# Patient Record
Sex: Male | Born: 1955 | Race: Black or African American | Hispanic: No | Smoking: Former smoker
Health system: Southern US, Community
[De-identification: ages and names within clinical notes are randomized; demographics above are authoritative.]

## PROBLEM LIST (undated history)

## (undated) DIAGNOSIS — I10 Essential (primary) hypertension: Secondary | ICD-10-CM

## (undated) DIAGNOSIS — R079 Chest pain, unspecified: Secondary | ICD-10-CM

## (undated) DIAGNOSIS — Z947 Corneal transplant status: Secondary | ICD-10-CM

## (undated) DIAGNOSIS — I2510 Atherosclerotic heart disease of native coronary artery without angina pectoris: Secondary | ICD-10-CM

## (undated) DIAGNOSIS — H548 Legal blindness, as defined in USA: Secondary | ICD-10-CM

## (undated) DIAGNOSIS — E119 Type 2 diabetes mellitus without complications: Secondary | ICD-10-CM

## (undated) DIAGNOSIS — E785 Hyperlipidemia, unspecified: Secondary | ICD-10-CM

## (undated) HISTORY — PX: ENUCLEATION: SHX628

## (undated) HISTORY — PX: CORONARY STENT PLACEMENT: SHX1402

## (undated) HISTORY — PX: CORNEAL TRANSPLANT: SHX108

## (undated) HISTORY — PX: CARDIAC SURGERY: SHX584

---

## 1998-06-10 ENCOUNTER — Emergency Department (HOSPITAL_COMMUNITY): Admission: EM | Admit: 1998-06-10 | Discharge: 1998-06-10 | Payer: Self-pay | Admitting: Emergency Medicine

## 1998-07-20 ENCOUNTER — Ambulatory Visit (HOSPITAL_COMMUNITY): Admission: RE | Admit: 1998-07-20 | Discharge: 1998-07-20 | Payer: Self-pay | Admitting: Orthopedic Surgery

## 1998-09-01 ENCOUNTER — Encounter: Admission: RE | Admit: 1998-09-01 | Discharge: 1998-10-02 | Payer: Self-pay | Admitting: Orthopedic Surgery

## 2000-10-08 ENCOUNTER — Emergency Department (HOSPITAL_COMMUNITY): Admission: EM | Admit: 2000-10-08 | Discharge: 2000-10-08 | Payer: Self-pay | Admitting: Emergency Medicine

## 2000-10-08 ENCOUNTER — Encounter: Payer: Self-pay | Admitting: Emergency Medicine

## 2000-11-07 ENCOUNTER — Emergency Department (HOSPITAL_COMMUNITY): Admission: EM | Admit: 2000-11-07 | Discharge: 2000-11-07 | Payer: Self-pay | Admitting: Emergency Medicine

## 2000-11-07 ENCOUNTER — Encounter: Payer: Self-pay | Admitting: Emergency Medicine

## 2000-11-08 ENCOUNTER — Emergency Department (HOSPITAL_COMMUNITY): Admission: EM | Admit: 2000-11-08 | Discharge: 2000-11-08 | Payer: Self-pay | Admitting: Emergency Medicine

## 2000-11-22 ENCOUNTER — Encounter: Admission: RE | Admit: 2000-11-22 | Discharge: 2001-02-20 | Payer: Self-pay | Admitting: Internal Medicine

## 2001-06-30 ENCOUNTER — Emergency Department (HOSPITAL_COMMUNITY): Admission: EM | Admit: 2001-06-30 | Discharge: 2001-06-30 | Payer: Self-pay | Admitting: Emergency Medicine

## 2001-06-30 ENCOUNTER — Encounter: Payer: Self-pay | Admitting: Emergency Medicine

## 2001-07-04 ENCOUNTER — Encounter: Payer: Self-pay | Admitting: Emergency Medicine

## 2001-07-04 ENCOUNTER — Emergency Department (HOSPITAL_COMMUNITY): Admission: EM | Admit: 2001-07-04 | Discharge: 2001-07-04 | Payer: Self-pay | Admitting: Emergency Medicine

## 2001-07-06 ENCOUNTER — Encounter: Payer: Self-pay | Admitting: Gastroenterology

## 2001-07-06 ENCOUNTER — Ambulatory Visit (HOSPITAL_COMMUNITY): Admission: RE | Admit: 2001-07-06 | Discharge: 2001-07-06 | Payer: Self-pay | Admitting: Gastroenterology

## 2001-07-07 ENCOUNTER — Encounter: Payer: Self-pay | Admitting: Gastroenterology

## 2001-07-07 ENCOUNTER — Ambulatory Visit (HOSPITAL_COMMUNITY): Admission: RE | Admit: 2001-07-07 | Discharge: 2001-07-07 | Payer: Self-pay | Admitting: Gastroenterology

## 2001-07-14 ENCOUNTER — Encounter: Payer: Self-pay | Admitting: Urology

## 2001-07-14 ENCOUNTER — Ambulatory Visit (HOSPITAL_COMMUNITY): Admission: RE | Admit: 2001-07-14 | Discharge: 2001-07-14 | Payer: Self-pay | Admitting: Urology

## 2001-07-28 ENCOUNTER — Encounter: Payer: Self-pay | Admitting: Emergency Medicine

## 2001-07-28 ENCOUNTER — Emergency Department (HOSPITAL_COMMUNITY): Admission: EM | Admit: 2001-07-28 | Discharge: 2001-07-28 | Payer: Self-pay | Admitting: Emergency Medicine

## 2001-08-04 ENCOUNTER — Ambulatory Visit (HOSPITAL_COMMUNITY): Admission: RE | Admit: 2001-08-04 | Discharge: 2001-08-04 | Payer: Self-pay | Admitting: Gastroenterology

## 2002-08-17 ENCOUNTER — Inpatient Hospital Stay (HOSPITAL_COMMUNITY): Admission: EM | Admit: 2002-08-17 | Discharge: 2002-08-18 | Payer: Self-pay | Admitting: Emergency Medicine

## 2002-08-17 ENCOUNTER — Encounter: Payer: Self-pay | Admitting: Emergency Medicine

## 2002-10-17 ENCOUNTER — Encounter: Payer: Self-pay | Admitting: Internal Medicine

## 2002-10-17 ENCOUNTER — Encounter: Admission: RE | Admit: 2002-10-17 | Discharge: 2002-10-17 | Payer: Self-pay | Admitting: Internal Medicine

## 2003-04-29 ENCOUNTER — Inpatient Hospital Stay (HOSPITAL_COMMUNITY): Admission: AD | Admit: 2003-04-29 | Discharge: 2003-05-01 | Payer: Self-pay | Admitting: Neurology

## 2003-04-29 ENCOUNTER — Emergency Department (HOSPITAL_COMMUNITY): Admission: EM | Admit: 2003-04-29 | Discharge: 2003-04-29 | Payer: Self-pay | Admitting: Emergency Medicine

## 2003-04-29 ENCOUNTER — Encounter: Payer: Self-pay | Admitting: Neurology

## 2003-04-29 ENCOUNTER — Encounter: Payer: Self-pay | Admitting: Emergency Medicine

## 2003-04-30 ENCOUNTER — Encounter: Payer: Self-pay | Admitting: Neurology

## 2003-04-30 ENCOUNTER — Encounter (INDEPENDENT_AMBULATORY_CARE_PROVIDER_SITE_OTHER): Payer: Self-pay | Admitting: Cardiology

## 2003-07-23 ENCOUNTER — Emergency Department (HOSPITAL_COMMUNITY): Admission: EM | Admit: 2003-07-23 | Discharge: 2003-07-23 | Payer: Self-pay | Admitting: *Deleted

## 2003-07-23 ENCOUNTER — Encounter: Payer: Self-pay | Admitting: Emergency Medicine

## 2003-11-14 ENCOUNTER — Emergency Department (HOSPITAL_COMMUNITY): Admission: EM | Admit: 2003-11-14 | Discharge: 2003-11-14 | Payer: Self-pay | Admitting: Emergency Medicine

## 2004-01-02 ENCOUNTER — Inpatient Hospital Stay (HOSPITAL_COMMUNITY): Admission: EM | Admit: 2004-01-02 | Discharge: 2004-01-03 | Payer: Self-pay | Admitting: Emergency Medicine

## 2004-01-26 ENCOUNTER — Emergency Department (HOSPITAL_COMMUNITY): Admission: EM | Admit: 2004-01-26 | Discharge: 2004-01-26 | Payer: Self-pay | Admitting: Emergency Medicine

## 2004-03-10 ENCOUNTER — Emergency Department (HOSPITAL_COMMUNITY): Admission: EM | Admit: 2004-03-10 | Discharge: 2004-03-10 | Payer: Self-pay

## 2004-07-30 ENCOUNTER — Ambulatory Visit: Payer: Self-pay | Admitting: *Deleted

## 2004-07-31 ENCOUNTER — Encounter: Payer: Self-pay | Admitting: Cardiovascular Disease

## 2004-07-31 ENCOUNTER — Inpatient Hospital Stay (HOSPITAL_COMMUNITY): Admission: EM | Admit: 2004-07-31 | Discharge: 2004-08-02 | Payer: Self-pay | Admitting: Emergency Medicine

## 2005-06-29 ENCOUNTER — Emergency Department (HOSPITAL_COMMUNITY): Admission: EM | Admit: 2005-06-29 | Discharge: 2005-06-29 | Payer: Self-pay | Admitting: Emergency Medicine

## 2005-07-06 ENCOUNTER — Emergency Department (HOSPITAL_COMMUNITY): Admission: EM | Admit: 2005-07-06 | Discharge: 2005-07-06 | Payer: Self-pay | Admitting: Emergency Medicine

## 2005-11-15 ENCOUNTER — Ambulatory Visit (HOSPITAL_COMMUNITY): Admission: RE | Admit: 2005-11-15 | Discharge: 2005-11-15 | Payer: Self-pay | Admitting: Orthopedic Surgery

## 2006-01-11 ENCOUNTER — Encounter (INDEPENDENT_AMBULATORY_CARE_PROVIDER_SITE_OTHER): Payer: Self-pay | Admitting: *Deleted

## 2006-01-11 ENCOUNTER — Ambulatory Visit (HOSPITAL_BASED_OUTPATIENT_CLINIC_OR_DEPARTMENT_OTHER): Admission: RE | Admit: 2006-01-11 | Discharge: 2006-01-11 | Payer: Self-pay | Admitting: Orthopedic Surgery

## 2006-10-07 ENCOUNTER — Ambulatory Visit: Payer: Self-pay | Admitting: Cardiology

## 2006-10-07 ENCOUNTER — Inpatient Hospital Stay (HOSPITAL_COMMUNITY): Admission: EM | Admit: 2006-10-07 | Discharge: 2006-10-11 | Payer: Self-pay | Admitting: Pediatrics

## 2006-10-08 ENCOUNTER — Encounter: Payer: Self-pay | Admitting: Cardiovascular Disease

## 2006-10-11 HISTORY — PX: CORONARY ANGIOPLASTY: SHX604

## 2007-09-13 ENCOUNTER — Ambulatory Visit (HOSPITAL_BASED_OUTPATIENT_CLINIC_OR_DEPARTMENT_OTHER): Admission: RE | Admit: 2007-09-13 | Discharge: 2007-09-13 | Payer: Self-pay | Admitting: Urology

## 2008-08-19 ENCOUNTER — Encounter: Admission: RE | Admit: 2008-08-19 | Discharge: 2008-08-19 | Payer: Self-pay | Admitting: Internal Medicine

## 2008-11-02 ENCOUNTER — Emergency Department (HOSPITAL_COMMUNITY): Admission: EM | Admit: 2008-11-02 | Discharge: 2008-11-02 | Payer: Self-pay | Admitting: Emergency Medicine

## 2008-12-26 ENCOUNTER — Encounter (INDEPENDENT_AMBULATORY_CARE_PROVIDER_SITE_OTHER): Payer: Self-pay | Admitting: Emergency Medicine

## 2008-12-26 ENCOUNTER — Ambulatory Visit: Payer: Self-pay | Admitting: Vascular Surgery

## 2008-12-26 ENCOUNTER — Emergency Department (HOSPITAL_COMMUNITY): Admission: EM | Admit: 2008-12-26 | Discharge: 2008-12-26 | Payer: Self-pay | Admitting: Emergency Medicine

## 2009-01-11 ENCOUNTER — Encounter: Admission: RE | Admit: 2009-01-11 | Discharge: 2009-01-11 | Payer: Self-pay | Admitting: Internal Medicine

## 2009-01-15 HISTORY — PX: OTHER SURGICAL HISTORY: SHX169

## 2009-03-04 ENCOUNTER — Emergency Department (HOSPITAL_COMMUNITY): Admission: EM | Admit: 2009-03-04 | Discharge: 2009-03-04 | Payer: Self-pay | Admitting: Emergency Medicine

## 2009-07-28 ENCOUNTER — Encounter (INDEPENDENT_AMBULATORY_CARE_PROVIDER_SITE_OTHER): Payer: Self-pay | Admitting: Internal Medicine

## 2009-07-28 ENCOUNTER — Ambulatory Visit: Payer: Self-pay | Admitting: Cardiology

## 2009-07-28 ENCOUNTER — Inpatient Hospital Stay (HOSPITAL_COMMUNITY): Admission: EM | Admit: 2009-07-28 | Discharge: 2009-07-30 | Payer: Self-pay | Admitting: Emergency Medicine

## 2009-07-28 HISTORY — PX: TRANSTHORACIC ECHOCARDIOGRAM: SHX275

## 2009-09-12 ENCOUNTER — Emergency Department (HOSPITAL_COMMUNITY): Admission: EM | Admit: 2009-09-12 | Discharge: 2009-09-12 | Payer: Self-pay | Admitting: Emergency Medicine

## 2010-10-28 ENCOUNTER — Encounter: Admission: RE | Admit: 2010-10-28 | Payer: Self-pay | Source: Home / Self Care | Admitting: Ophthalmology

## 2010-11-11 IMAGING — CR DG CHEST 2V
2 series · 2 of 2 positions shown · non-contrast
Comparison: Chest radiograph performed 12/26/2008

CLINICAL DATA: Chest pain and headache.

CHEST - 2 VIEW

[w chest pa]
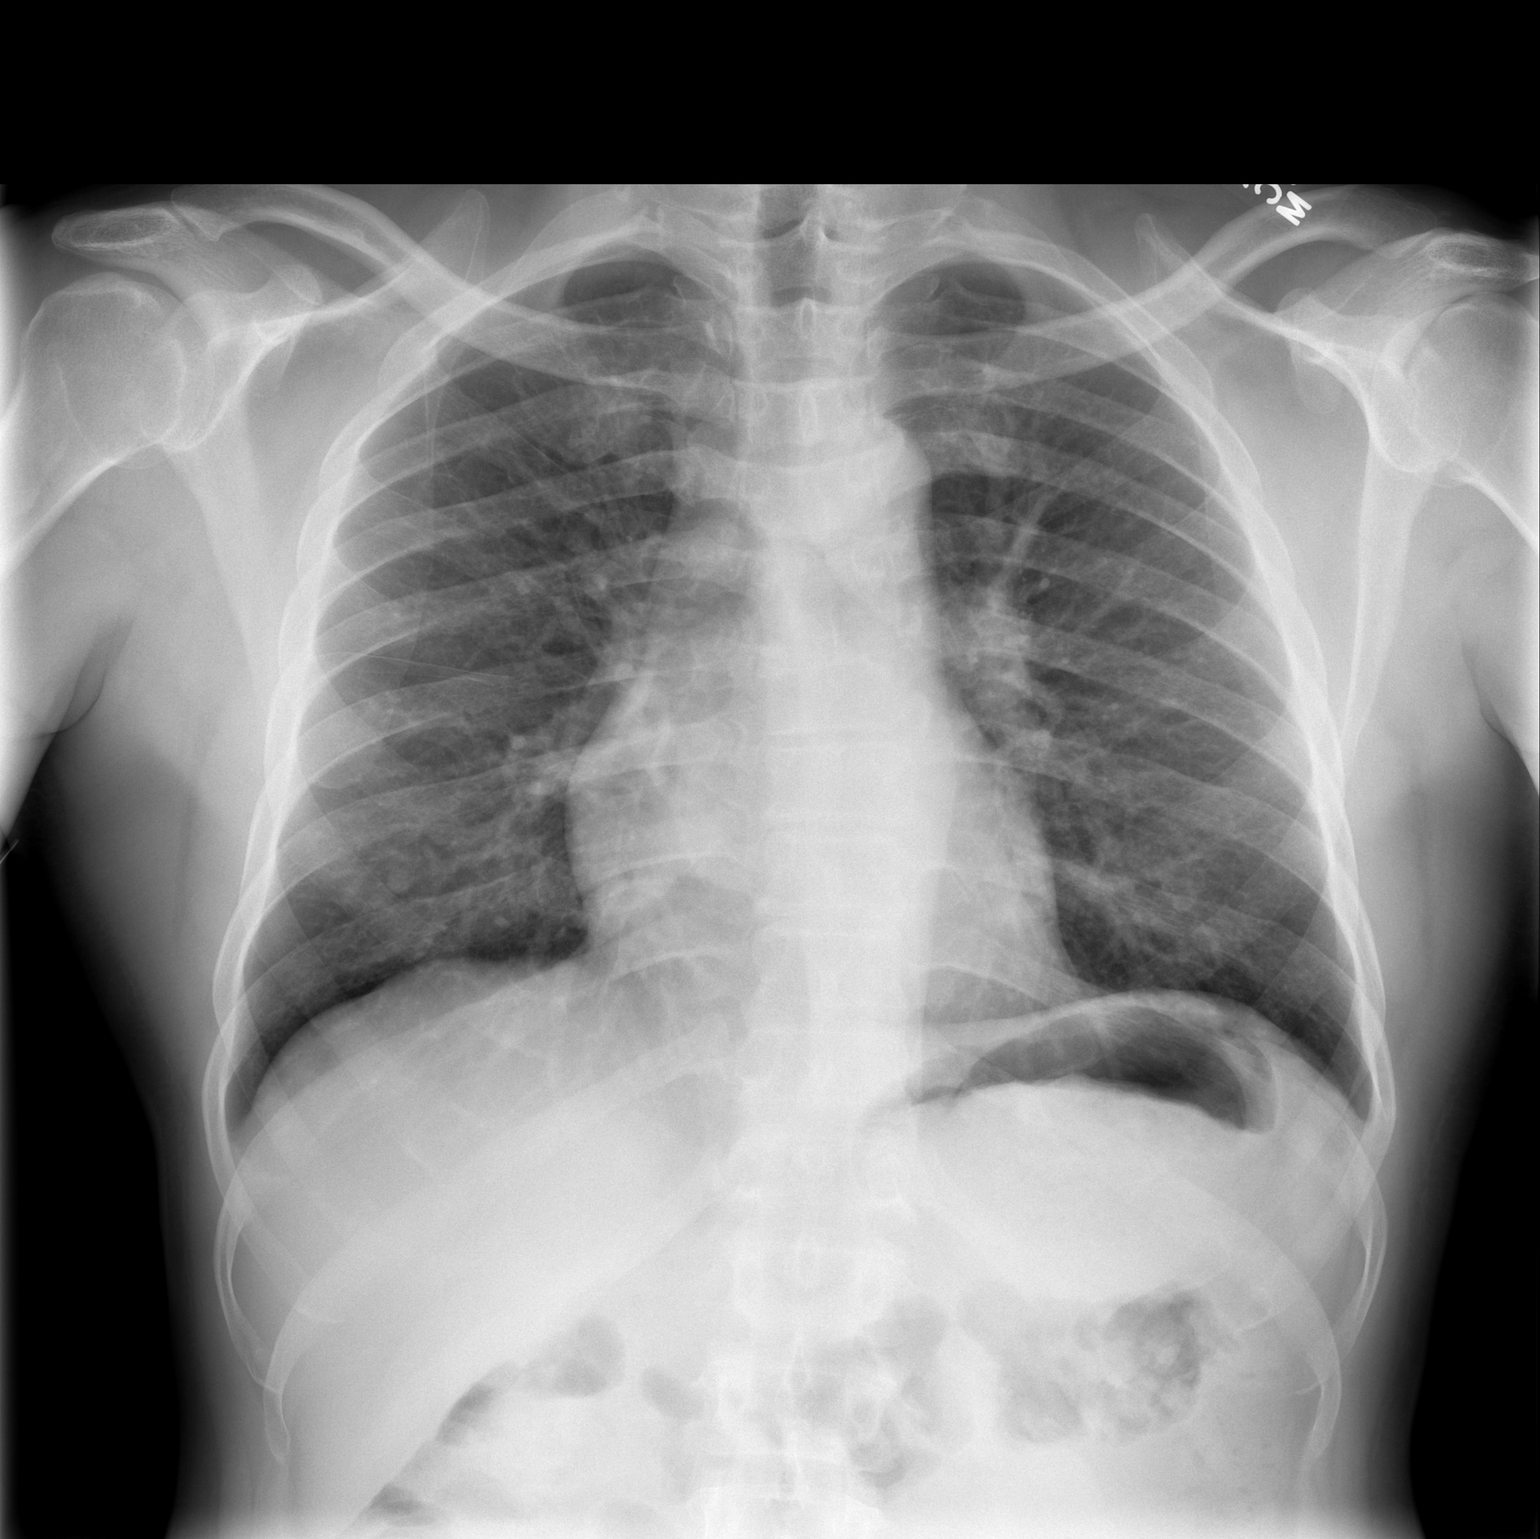

[w chest lat]
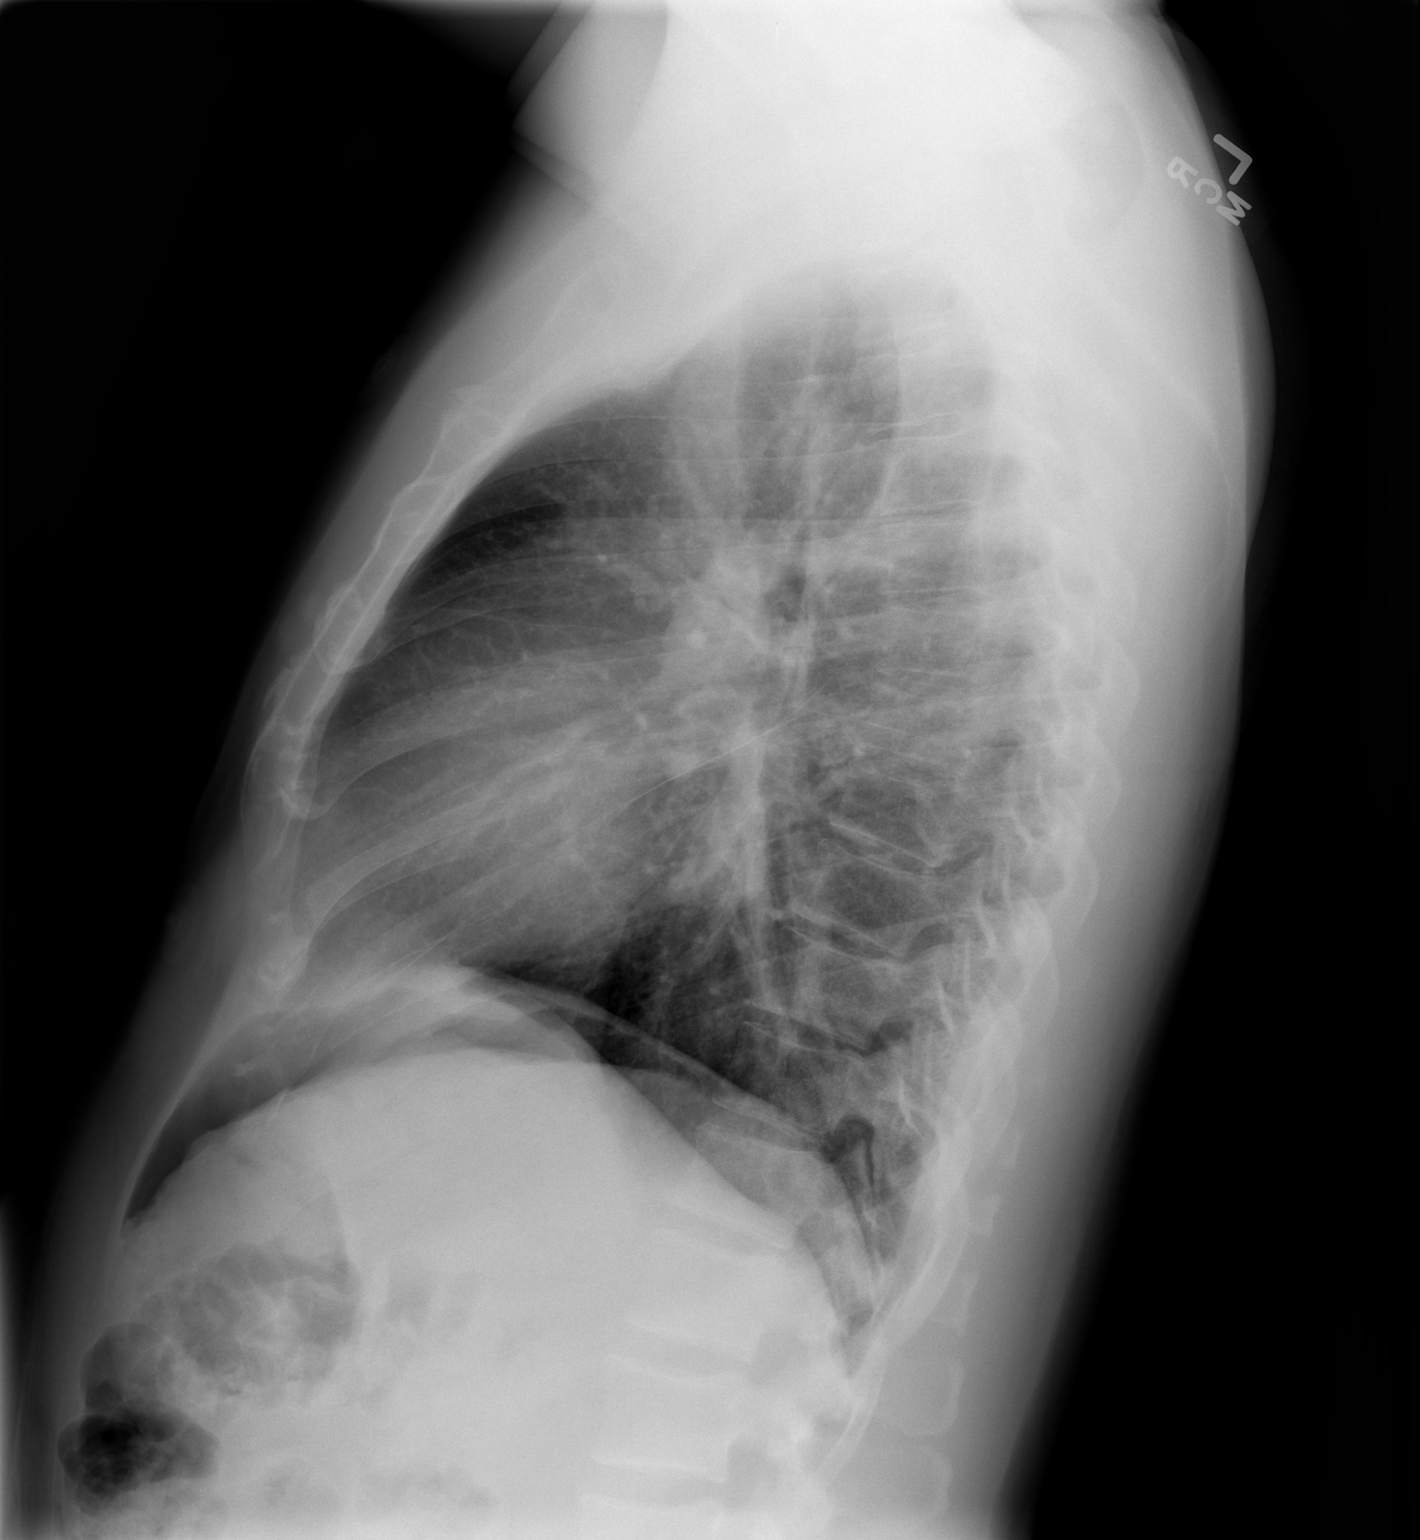

[2 of 2 positions shown; findings below may reference images not displayed]

FINDINGS: The lungs are well-aerated and clear.  There is no
evidence of focal opacification, pleural effusion or pneumothorax.

The heart is normal in size; the mediastinal contour is within
normal limits.  There is slight prominence of the pulmonary
vasculature, in the upper limits of normal.  No acute osseous
abnormalities are seen.
IMPRESSION: No acute cardiopulmonary process seen.

## 2010-11-25 ENCOUNTER — Encounter: Admit: 2010-11-25 | Payer: Self-pay | Admitting: Ophthalmology

## 2010-11-26 ENCOUNTER — Ambulatory Visit: Payer: Medicare Other | Attending: Ophthalmology | Admitting: Occupational Therapy

## 2010-11-26 DIAGNOSIS — IMO0001 Reserved for inherently not codable concepts without codable children: Secondary | ICD-10-CM | POA: Insufficient documentation

## 2010-11-26 DIAGNOSIS — H5410 Blindness, one eye, low vision other eye, unspecified eyes: Secondary | ICD-10-CM | POA: Insufficient documentation

## 2011-01-12 LAB — POCT I-STAT, CHEM 8
BUN: 9 mg/dL (ref 6–23)
Calcium, Ion: 1.2 mmol/L (ref 1.12–1.32)
Chloride: 99 meq/L (ref 96–112)
Creatinine, Ser: 0.8 mg/dL (ref 0.4–1.5)
Glucose, Bld: 105 mg/dL — ABNORMAL HIGH (ref 70–99)
HCT: 49 % (ref 39.0–52.0)
Hemoglobin: 16.7 g/dL (ref 13.0–17.0)
Potassium: 4.8 meq/L (ref 3.5–5.1)
Sodium: 137 meq/L (ref 135–145)
TCO2: 30 mmol/L (ref 0–100)

## 2011-01-12 LAB — URINALYSIS, ROUTINE W REFLEX MICROSCOPIC
Bilirubin Urine: NEGATIVE
Glucose, UA: NEGATIVE mg/dL
Hgb urine dipstick: NEGATIVE
Ketones, ur: NEGATIVE mg/dL
Nitrite: NEGATIVE
Protein, ur: NEGATIVE mg/dL
Specific Gravity, Urine: 1.011 (ref 1.005–1.030)
Urobilinogen, UA: 1 mg/dL (ref 0.0–1.0)
pH: 6.5 (ref 5.0–8.0)

## 2011-01-12 LAB — GLUCOSE, CAPILLARY: Glucose-Capillary: 94 mg/dL (ref 70–99)

## 2011-01-14 LAB — CBC
HCT: 38.2 % — ABNORMAL LOW (ref 39.0–52.0)
HCT: 40.8 % (ref 39.0–52.0)
HCT: 41.3 % (ref 39.0–52.0)
Hemoglobin: 14 g/dL (ref 13.0–17.0)
Hemoglobin: 14.2 g/dL (ref 13.0–17.0)
MCHC: 34.2 g/dL (ref 30.0–36.0)
MCHC: 34.2 g/dL (ref 30.0–36.0)
MCV: 99.5 fL (ref 78.0–100.0)
MCV: 99.5 fL (ref 78.0–100.0)
Platelets: 182 10*3/uL (ref 150–400)
Platelets: 184 10*3/uL (ref 150–400)
Platelets: 206 10*3/uL (ref 150–400)
RBC: 4.16 MIL/uL — ABNORMAL LOW (ref 4.22–5.81)
RDW: 13.8 % (ref 11.5–15.5)
RDW: 14 % (ref 11.5–15.5)
RDW: 14.1 % (ref 11.5–15.5)

## 2011-01-14 LAB — LIPID PANEL
LDL Cholesterol: 59 mg/dL (ref 0–99)
VLDL: 16 mg/dL (ref 0–40)

## 2011-01-14 LAB — BASIC METABOLIC PANEL
BUN: 10 mg/dL (ref 6–23)
BUN: 10 mg/dL (ref 6–23)
BUN: 11 mg/dL (ref 6–23)
CO2: 27 mEq/L (ref 19–32)
CO2: 28 mEq/L (ref 19–32)
CO2: 29 mEq/L (ref 19–32)
Calcium: 9.1 mg/dL (ref 8.4–10.5)
Calcium: 9.3 mg/dL (ref 8.4–10.5)
Calcium: 9.3 mg/dL (ref 8.4–10.5)
Chloride: 105 mEq/L (ref 96–112)
Creatinine, Ser: 0.99 mg/dL (ref 0.4–1.5)
Creatinine, Ser: 1.2 mg/dL (ref 0.4–1.5)
Creatinine, Ser: 1.22 mg/dL (ref 0.4–1.5)
GFR calc Af Amer: >60 mL/min (ref >60)
GFR calc non Af Amer: >60 mL/min (ref >60)
GFR calc non Af Amer: >60 mL/min (ref >60)
GFR calc non Af Amer: >60 mL/min (ref >60)
Glucose, Bld: 104 mg/dL — ABNORMAL HIGH (ref 70–99)
Glucose, Bld: 86 mg/dL (ref 70–99)
Glucose, Bld: 89 mg/dL (ref 70–99)
Potassium: 4 mEq/L (ref 3.5–5.1)
Potassium: 4.1 mEq/L (ref 3.5–5.1)
Sodium: 137 mEq/L (ref 135–145)
Sodium: 139 mEq/L (ref 135–145)

## 2011-01-14 LAB — URINE MICROSCOPIC-ADD ON

## 2011-01-14 LAB — DIFFERENTIAL
Basophils Absolute: 0 10*3/uL (ref 0.0–0.1)
Basophils Relative: 0 % (ref 0–1)
Eosinophils Relative: 0 % (ref 0–5)
Lymphocytes Relative: 33 % (ref 12–46)
Monocytes Absolute: 0.7 10*3/uL (ref 0.1–1.0)

## 2011-01-14 LAB — GLUCOSE, CAPILLARY
Glucose-Capillary: 106 mg/dL — ABNORMAL HIGH (ref 70–99)
Glucose-Capillary: 135 mg/dL — ABNORMAL HIGH (ref 70–99)
Glucose-Capillary: 77 mg/dL (ref 70–99)
Glucose-Capillary: 78 mg/dL (ref 70–99)
Glucose-Capillary: 78 mg/dL (ref 70–99)
Glucose-Capillary: 82 mg/dL (ref 70–99)

## 2011-01-14 LAB — URINALYSIS, ROUTINE W REFLEX MICROSCOPIC
Bilirubin Urine: NEGATIVE
Nitrite: NEGATIVE

## 2011-01-14 LAB — HOMOCYSTEINE: Homocysteine: 14 umol/L (ref 4.0–15.4)

## 2011-01-14 LAB — TSH: TSH: 1.217 u[IU]/mL (ref 0.350–4.500)

## 2011-01-14 LAB — CARDIAC PANEL(CRET KIN+CKTOT+MB+TROPI)
Relative Index: 0.4 (ref 0.0–2.5)
Relative Index: 0.5 (ref 0.0–2.5)
Troponin I: <0.01 ng/mL (ref 0.00–0.06)

## 2011-01-14 LAB — D-DIMER, QUANTITATIVE: D-Dimer, Quant: 0.26 ug/mL-FEU (ref 0.00–0.48)

## 2011-01-14 LAB — PROTIME-INR: INR: 1.06 (ref 0.00–1.49)

## 2011-01-14 LAB — HEMOGLOBIN A1C: Mean Plasma Glucose: 143 mg/dL

## 2011-01-14 LAB — POCT CARDIAC MARKERS: Troponin i, poc: <0.05 ng/mL (ref 0.00–0.09)

## 2011-01-14 LAB — CK TOTAL AND CKMB (NOT AT ARMC): Relative Index: 0.4 (ref 0.0–2.5)

## 2011-01-19 LAB — DIFFERENTIAL
Basophils Absolute: 0 10*3/uL (ref 0.0–0.1)
Basophils Relative: 0 % (ref 0–1)
Eosinophils Absolute: 0 10*3/uL (ref 0.0–0.7)
Eosinophils Relative: 0 % (ref 0–5)
Lymphocytes Relative: 23 % (ref 12–46)
Monocytes Absolute: 0.4 10*3/uL (ref 0.1–1.0)

## 2011-01-19 LAB — POCT I-STAT, CHEM 8
BUN: 14 mg/dL (ref 6–23)
Calcium, Ion: 1.27 mmol/L (ref 1.12–1.32)
HCT: 46 % (ref 39.0–52.0)
Hemoglobin: 15.6 g/dL (ref 13.0–17.0)
Sodium: 138 mEq/L (ref 135–145)
TCO2: 27 mmol/L (ref 0–100)

## 2011-01-19 LAB — URINALYSIS, ROUTINE W REFLEX MICROSCOPIC
Glucose, UA: NEGATIVE mg/dL
Ketones, ur: NEGATIVE mg/dL
Protein, ur: NEGATIVE mg/dL
Urobilinogen, UA: 1 mg/dL (ref 0.0–1.0)

## 2011-01-19 LAB — CBC
HCT: 43.3 % (ref 39.0–52.0)
Hemoglobin: 14.8 g/dL (ref 13.0–17.0)
MCHC: 34.2 g/dL (ref 30.0–36.0)
MCV: 96.2 fL (ref 78.0–100.0)
Platelets: 176 10*3/uL (ref 150–400)
RDW: 14.8 % (ref 11.5–15.5)

## 2011-01-19 LAB — HEMOCCULT GUIAC POC 1CARD (OFFICE): Fecal Occult Bld: NEGATIVE

## 2011-01-21 LAB — DIFFERENTIAL
Basophils Relative: 0 % (ref 0–1)
Eosinophils Absolute: 0 10*3/uL (ref 0.0–0.7)
Neutro Abs: 4.5 10*3/uL (ref 1.7–7.7)
Neutrophils Relative %: 80 % — ABNORMAL HIGH (ref 43–77)

## 2011-01-21 LAB — HEPATIC FUNCTION PANEL
AST: 41 U/L — ABNORMAL HIGH (ref 0–37)
Albumin: 4.8 g/dL (ref 3.5–5.2)
Total Protein: 8.5 g/dL — ABNORMAL HIGH (ref 6.0–8.3)

## 2011-01-21 LAB — BASIC METABOLIC PANEL
BUN: 10 mg/dL (ref 6–23)
CO2: 26 mEq/L (ref 19–32)
Calcium: 10 mg/dL (ref 8.4–10.5)
Chloride: 102 mEq/L (ref 96–112)
Creatinine, Ser: 1.1 mg/dL (ref 0.4–1.5)
GFR calc Af Amer: >60 mL/min (ref >60)

## 2011-01-21 LAB — POCT CARDIAC MARKERS
CKMB, poc: <1 ng/mL — ABNORMAL LOW (ref 1.0–8.0)
Myoglobin, poc: 70 ng/mL (ref 12–200)
Troponin i, poc: <0.05 ng/mL (ref 0.00–0.09)

## 2011-01-21 LAB — CBC
MCHC: 34.5 g/dL (ref 30.0–36.0)
MCV: 95.2 fL (ref 78.0–100.0)
Platelets: 205 10*3/uL (ref 150–400)
WBC: 5.5 10*3/uL (ref 4.0–10.5)

## 2011-02-23 NOTE — Op Note (Signed)
NAME:  Steven, [name]              ACCOUNT NO.:  [id number]   MEDICAL RECORD NO.:  [id number]          PATIENT TYPE:  AMB   LOCATION:  [facility]                         FACILITY:  [facility]   PHYSICIAN:  Lindaann Slough, M.D.  DATE OF BIRTH:  May 13, 1956   DATE OF PROCEDURE:  09/13/2007  DATE OF DISCHARGE:                               OPERATIVE REPORT   PREOPERATIVE DIAGNOSIS:  Left ureteral calculus with hydronephrosis.   POSTOPERATIVE DIAGNOSES:  Left ureteral calculus with hydronephrosis  plus urethral stricture.   PROCEDURE DONE:  Cystoscopy, urethral dilation, left retrograde  pyelogram, ureteroscopy with holmium laser of left ureteral calculus and  insertion of double-AJ catheter.   SURGEON:  Danae Chen, M.D.   ANESTHESIA:  General.   DATE OF PROCEDURE:  September 13, 2007.   INDICATIONS:  The patient is a 55 year old male who was seen in the  emergency room in [place] on July 24, 2007 for severe left  flank pain associated with hematuria.  CT scan showed a 7 mm stone just  distal to the junction of the 2 proximal left ureters in  a duplicated  left system.  It is associated with a moderate left hydronephrosis and  hydroureter.  The patient was seen in the office on August 29, 2007  and he had continued to complain of pain.  He was treated with Vicodin.  A week later he returned to the office for follow-up and had not passed  the stone.  He had been on Plavix and aspirin.  They were discontinued  and he is scheduled today for stone manipulation.   Under general anesthesia the patient was prepped and draped and placed  in the dorsal lithotomy position.  A panendoscope was inserted in the  urethra but could not be passed into the bladder because of a stricture  in the bulbous urethra.  A a sensor-tip guidewire was passed through the  cystoscope into the bladder.  The cystoscope was removed.  The urethra  was then dilated up to #24-French with [facility] dilators.  Then  the  guidewire was removed and the cystoscope was then inserted in the  bladder.  There was moderate prostatic hypertrophy.  The bladder is  moderately trabeculated.  There is no stone or tumor in the bladder.  There is some edema at the left ureteral orifice.   Retrograde pyelogram:  A cone-tip catheter was passed through the  cystoscope into the left ureteral orifice.  Contrast was then injected  through the cone-tip catheter.  There is a filling defect in the distal  ureter with proximal hydroureter and hydronephrosis.  Both ureters above  the junction of the ureters were dilated.  The cone-tip catheter was  then removed.  A sensor-tip guidewire was passed through the cystoscope  but could not be passed through the ureteral orifice.  The sensor-tip  wire was then removed.  A Glidewire was passed through a #6-French open-  ended ureteral catheter and the open-ended catheter and Glidewire were  passed through the cystoscope into the left ureteral orifice.  The  Glidewire was then advanced up to the  mid ureter but could not be passed  beyond that point and appears to be at the confluence of the ureters.  After several attempts I then decided not to pass the Glidewire all the  way up into the kidney.  I removed the cystoscope and I dilated the  intramural ureter away from the inner sheath of the ureteroscope access  sheath.  The ureteroscope access sheath was then removed.  A #6.5 French  semirigid ureteroscope was then passed in the bladder and through the  ureter and advanced all the way up to the level of the stone.  The stone  was then visualized in the ureter.  A 360 microfiber holmium laser was  passed down through the ureteroscope and with a setting of 0.8 joules  the stone was fragmented in multiple stone fragments.  Then a Glidewire  was passed through the ureteroscope and again the Glidewire could not be  advanced in the upper ureter in that there appears to be some  obstruction  at the confluence of the 2 ureters.  However, after several  maneuvers the Glidewire was passed through the ureter and advanced all  the way up into the renal pelvis.  The ureteroscope was then removed.  An open-ended catheter was passed over the Glidewire all the way up into  the renal pelvis.  The Glidewire was then removed.   Second retrograde pyelogram:  Contrast was then injected through the  open-ended catheter and there is no evidence of extravasation of  contrast and the renal pelvis and calyces are dilated.  A sensor-tip  guidewire was then passed through the open-ended catheter and the open-  ended catheter was removed.  Then the ureteroscope was reinserted in the  bladder but I could not pass it again through the ureteral orifice.  The  ureteroscope was removed.  The ureteroscope access sheath was then  passed over the guidewire and the intramural ureter was dilated and the  ureteroscope access sheath was removed.  I again attempted to pass the  ureteroscope through the ureteral orifice but it was difficult because  of edema at the ureteral orifice.  I removed the ureteroscope.  The  guidewire was then back-loaded into the cystoscope and a #6-French 26  double-AJ catheter was passed over the guidewire.  The proximal curl of  the double-J catheter is in the upper collecting system.  The distal  curl is in the bladder.  The bladder was then emptied and the cystoscope  and guidewire were removed.   The patient tolerated the procedure well and left the OR in satisfactory  condition to postanesthesia care unit.      Lindaann Slough, M.D.  Electronically Signed     MN/MEDQ  D:  09/13/2007  T:  09/14/2007  Job:  [id number]

## 2011-02-26 NOTE — Op Note (Signed)
[facility]  Patient:    Steven [name], Steven [name] Visit Number: [id number] MRN: [id number]          Service Type: END Location: ENDO Attending Physician:  Charna Elizabeth Proc. Date: 07/14/01 Admit Date:  07/07/2001 Discharge Date: 07/07/2001                             Operative Report  PREOPERATIVE DIAGNOSES: 1. Right hydroureter. 2. Left ureteral stone with hydronephrosis.  POSTOPERATIVE DIAGNOSES: 1. Right hydroureter. 2. Left ureteral stone with hydronephrosis. 3. Urethral stricture.  PROCEDURE DONE:  Cystoscopy, visual urethrectomy, right retrograde pyelogram, ureteroscopy with holmium laser ureteral stone extraction, and insertion of left double-J catheter.  SURGEON:  Lindaann Slough, M.D.  ANESTHESIA:  General.  INDICATION:  The patient is a 55 year old male, who has been complaining of bilateral flank pain associated with frequency, hesitancy, decreased force of the urinary stream.  A CT scan of the abdomen showed a 7 x 6 mm stone in the left distal ureter with left hydronephrosis and a right hydroureter down to the UV junction without evidence of ureteral stone.  He also has findings of prostatic hypertrophy.  The patient is scheduled today for cystoscopy, right retrograde pyelogram, and ureteroscopy with stone manipulation.  DESCRIPTION OF PROCEDURE:  Under general anesthesia, the patient was prepped and draped and placed in the dorsal lithotomy position.  A #22 Wappler cystoscope was inserted in the urethra.  But the cystoscope could not be passed in the bladder because of a stricture in the bulbous urethra.  The cystoscope was removed.  A visual urethrotome was then inserted in the urethra and the stricture was incised, and the urethrotome could be advanced in the bladder.  The urethrotome was then removed.  The cystoscope was then passed in the bladder.  There was moderate hypertrophy of the prostate gland.  There was some edema at the  level of the trigone.  The bladder was moderately trabeculated.  There was no stone or tumor in the bladder.  The ureteral orifices are in normal position and shape.  A cone tip catheter was then passed through the cystoscope and into the right ureteral orifice.  Contrast was then injected through the cone tip catheter.  There was no evidence of filling defect in the ureter.  There was a moderate dilation of the ureter. There was no evidence of stricture.  The cone tip catheter was then removed. Then a guidewire could not be passed through the left ureteral orifice.  A Glidewire was then passed through an open end catheter and passed through the ureter.  The open end catheter was then advanced over the Glidewire.  The Glidewire was removed, and a guidewire was passed through the open end catheter, and the open end catheter was removed.  The intramural ureter was then dilated with the urethral balloon catheter.  The urethral balloon catheter and the cystoscope were then removed.  A #6.5 French rigid ureteroscope was then passed in the bladder and into the ureter.  The stone was visualized in the distal ureter.  With the holmium laser, the stone was fragmented in multiple small fragments.  Then some small fragments were removed with the basket.  The ureteroscope was then reinserted in the ureter, and there was no evidence of ureteral injury.  Then the guidewire was backloaded into the cystoscope, and a #6 [place] - 26 double-J catheter was passed over the guidewire.  The proximal coil was  in the collecting system, and the distal coil was in the bladder.  The cystoscope and guidewire were then removed.  A #18 Foley catheter was then passed in the bladder.  The patient tolerated the procedure well and left the OR in satisfactory condition to postanesthesia care unit. Attending Physician:  Charna Elizabeth DD:  07/14/01 TD:  07/15/01 Job: [id number]

## 2011-02-26 NOTE — Discharge Summary (Signed)
NAME:  KEEDAN, SAMPLE                        ACCOUNT NO.:  [id number]   MEDICAL RECORD NO.:  [id number]                   PATIENT TYPE:  INP   LOCATION:  3703                                 FACILITY:  [facility]   PHYSICIAN:  Willa Rough, M.D.                  DATE OF BIRTH:  [birth year]   DATE OF ADMISSION:  01/02/2004  DATE OF DISCHARGE:  01/03/2004                                 DISCHARGE SUMMARY   ADDENDUM:   LABORATORIES:  Admission chest x-ray:  No acute disease.   Hemoglobin 16, hematocrit 47.  D-dimer 0.28.  Sodium 137, potassium 3.9,  chloride 104, CO2 28, glucose 103, BUN 10, creatinine 1.3, calcium 8.6.  Cardiac enzymes negative x3.   CURRENT MEDICATIONS:  1. Coated aspirin 81 mg daily.  2. Welchol as taken previously.   PAIN MANAGEMENT:  Tylenol as needed.   ACTIVITY:  No driving, heavy lifting, exertion or work for 3 days.   DIET:  Diet as previously.   WOUND CARE:  The patient is to call our office in [facility] for any groin  swelling, bleeding or bruising.   FOLLOWUP:  Followup is with Dr. Allyne Gee in the next 1-2 weeks.  The patient  has been advised to call for an appointment.      Tereso Newcomer, P.A.                        Willa Rough, M.D.    SW/MEDQ  D:  01/03/2004  T:  01/03/2004  Job:  [id number]   cc:   Willa Rough, M.D.   Candyce Churn. Allyne Gee, M.D.  [address]  [address]  [place]  [place] [address]  Fax: [phone]

## 2011-02-26 NOTE — Discharge Summary (Signed)
NAME:  Steven [name], Steven [name]              ACCOUNT NO.:  [id number]   MEDICAL RECORD NO.:  [id number]          PATIENT TYPE:  INP   LOCATION:  3733                         FACILITY:  [facility]   PHYSICIAN:  Mallory Shirk, MD     DATE OF BIRTH:  [birth year]   DATE OF ADMISSION:  07/30/2004  DATE OF DISCHARGE:  08/02/2004                                 DISCHARGE SUMMARY   ADDENDUM:  MRI of the brain with and without contrast showed no acute intracranial  stenosis or occlusion, but it did show extensive changes in the pons basal  ganglia white matter which were compatible with chronic ischemic changes  which was stable since the prior MRI, and there was no acute infarct.  However, it was noted that a retinal detachment of the right eye, which was  not present on MRI of April 29, 2003, was noted.   The patient stated that he was seen by Dr. Lottie Dawson at [facility] the day prior to admission.   Dr. Lottie Dawson was called [phone]), and the MRI findings were communicated  to him.  Dr.  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  Dr. Lottie Dawson will be following Mr. Crew Goren for his  ophthalmology problems.       GDK/MEDQ  D:  08/03/2004  T:  08/03/2004  Job:  [id number]

## 2011-02-26 NOTE — H&P (Signed)
NAME:  Steven [name], Steven [name]                        ACCOUNT NO.:  [id number]   MEDICAL RECORD NO.:  [id number]                   PATIENT TYPE:  INP   LOCATION:  3703                                 FACILITY:  [facility]   PHYSICIAN:  Willa Rough, M.D.                  DATE OF BIRTH:  07-05-1956   DATE OF ADMISSION:  01/02/2004  DATE OF DISCHARGE:                                HISTORY & PHYSICAL   CHIEF COMPLAINT:  Chest pain.   HISTORY OF PRESENT ILLNESS:  Steven [name] is a 55 year old male with no  known history of coronary artery disease.  He has had a couple of episodes  of chest pain in the past and came to the emergency room in February 2005  where he had an EKG and initial cardiac enzymes were negative and he was  discharged.  At 6:30 a.m. on the day of admission, he had onset of  substernal chest pain that radiated to his left arm.  It was associated with  shortness of breath and some nausea with vomiting x1.  He denies  diaphoresis.  He was up getting ready for work when it started.  He went to  work, worked for about an hour and a half, and the symptoms did not abate so  he came to the emergency room.  He stated they were 10/10 at the worst.  In  the emergency room, he received sublingual and IV nitroglycerin as well as  p.o. Lopressor and aspirin.  At the time of evaluation, his chest pain was  an 8/10.  His EKG was reviewed and it was felt that there was some inferior  ST elevation in addition to early repolarization and it was felt that he was  having an acute MI and needed to be taken urgently to the catheterization  lab.   PAST MEDICAL HISTORY:  1. History of hyperlipidemia.  2. Family history of premature coronary artery disease.  3. Possible hypertension.   PAST SURGICAL HISTORY:  He is status post appendectomy.   ALLERGIES:  No known drug allergies.   MEDICATIONS:  He takes Welchol as the only medication that he states he is  on at this time and does not know the  dose.   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  He has approximately a 15-pack-year  history of tobacco but quit five years ago.  He denies ETOH or drug abuse.   FAMILY HISTORY:  His mother died at age 44 of an MI and his father died at  age 80 without any history of coronary artery disease.  He has no brothers  or sisters with heart disease.   REVIEW OF SYSTEMS:  The patient states he felt like he was running a bit of  a fever today but did not take his temperature.  Chest pain  and shortness of  breath as described above.  He denies any cough or wheezing or recent upper  respiratory infection.  He has chronic pain that he describes as a burning  in his arms and legs.  He states that he had coffee-ground emesis  approximately two weeks ago but denies black or tarry stools or any other  recent episodes of hematemesis.  Review of systems is otherwise negative.   PHYSICAL EXAMINATION:  VITAL SIGNS:  Temperature is 98, blood pressure  137/76, pulse 59, respiratory rate 20, O2 saturation 98% on room air.  GENERAL:  He is a slender, well-developed, African American male in positive  distress.  HEENT:  His head is normocephalic and atraumatic with pupils equal, round,  and reactive to light and accommodation.  Extraocular movements are intact  and sclerae are clear.  Nares without discharge.  NECK:  Supple and no lymphadenopathy, thyromegaly, bruits, or JVD is noted.  CARDIOVASCULAR:  His heart is regular in rate and rhythm with an S1, S2 and  no significant murmur, rub, or gallop is noted.  Distal pulses are 2+ in all  four extremities and no femoral bruits are appreciated.  LUNGS:  Clear to auscultation bilaterally.  SKIN:  No rashes or lesions are noted.  ABDOMEN:  Firm and nontender with active bowel sounds and no  hepatosplenomegaly by percussion.  EXTREMITIES:  There is no cyanosis, clubbing, or edema noted.   MUSCULOSKELETAL:  There is no joint deformity, effusions, and no spine or  CVA tenderness.  NEUROLOGIC:  He is alert and oriented x3 with cranial nerves II-XII grossly  intact.   Chest x-ray is pending.   EKG is sinus bradycardia, rate of 56 with inferior ST changes as well as  early repolarization.   Laboratory values are incomplete but for an i-STAT we have a hemoglobin of  16 and hematocrit of 47.  Sodium 136, potassium 4, chloride 105, CO2 26, BUN  13, creatinine 1.2, glucose 105.  Coags and cardiac enzymes are incomplete  at this time.   ASSESSMENT AND PLAN:  1. Chest pain:  Symptoms are concerning for acute myocardial infarction and     he will be taken urgently to the catheterization lab.  Further evaluation     will depend on the results.  2. Hyperlipidemia:  We will check a lipid profile in the a.m. and make     medication changes based on those results.  3. The patient is otherwise stable and will have a beta-blocker added to his     medication regimen for blood pressure control as he will tolerate.   Dr. Willa Rough saw the patient and determined the plan of care.      Steven [name], P.A. [facility]                  Willa Rough, M.D.    RB/MEDQ  D:  01/02/2004  T:  01/03/2004  Job:  [id number]   cc:   Candyce Churn. Allyne Gee, M.D.  [address]  [address]  [place]  [place] [address]  Fax: [phone]

## 2011-02-26 NOTE — Discharge Summary (Signed)
NAME:  Steven [name], SAVO NO.:  [id number]   MEDICAL RECORD NO.:  [id number]          PATIENT TYPE:  INP   LOCATION:  3733                         FACILITY:  [facility]   PHYSICIAN:  Mallory Shirk, MD     DATE OF BIRTH:  1955-12-22   DATE OF ADMISSION:  07/30/2004  DATE OF DISCHARGE:  08/02/2004                                 DISCHARGE SUMMARY   PRIMARY CARE PHYSICIAN:  Dorothyann Peng, M.D.   CARDIOLOGIST:  Willa Rough, M.D.   NEUROLOGIST:  Gustavus Messing. Orlin Hilding, M.D.   DISCHARGE DIAGNOSES:  1.  Transient ischemic attack.  2.  Hypercholesterolemia.   DISCHARGE MEDICATIONS:  1.  Aspirin 325 mg p.o. daily.  2.  Welchol 625 mg three tablets p.o. b.i.d.   FOLLOW UP APPOINTMENTS:  1.  With Dr. Marcelino Freestone, neurology, within 10 days.  2.  With primary care physician, Dr. Dorothyann Peng, within 10 days.   HISTORY OF PRESENT ILLNESS:  [name] is a 55 year old African-American  man with a Past Medical History of transient ischemic attacks, dyslipidemia,  and questionable hypertension as well as minimal coronary artery disease.  The patient came in on July 30, 2004 with complaints of left-sided  numbness involving the left side of his face, his upper and lower  extremities, and associated chest pain.  The patient had a similar episode  last year; however, that one was on his right side.  Initial workup at that  time did not indicate any definite cause for his symptoms even though there  were CT findings that may have suggested a stroke.  The patient also had  some chest pain which was spasmodic lasting  over 5 seconds and described by  the patient as an electric shock coming and going.  Denied any nausea,  vomiting, or diarrhea.  No shortness of breath or history of falls or  trauma.  Of note, the patient reluctant to give a urine sample for a urine  drug screen.   PAST MEDICAL HISTORY:  1.  TIA in July 2004 admitted to the stroke service.  2.  Minimal  coronary artery disease status post cardiac catheterization      March 2005 by Dr. Samule Ohm.  3.  History of appendectomy.  4.  Questionable hypertension.  5.  Dyslipidemia.  6.  Family history of coronary artery disease.  7.  Glaucoma and is blind in his right eye from  his glaucoma.  8.  Right knee surgery.  9.  Eye surgery.   ALLERGIES:  No known drug allergies.   MEDICATIONS ON ADMISSION:  Welchol which he is noncompliant with.   PHYSICAL EXAMINATION ON ADMISSION:  VITAL SIGNS:  Blood pressure 137/47,  pulse 70, temperature 98.3, respirations 20.  O2 saturations 98% on room  air.  GENERAL:  The patient lying in bed in no acute distress, slightly  overweight.  HEENT:  Bilateral conjunctival injection, 4 mm cloudy cornea on the right.  NECK:  Supple, no JVD, no LAD.  LUNGS:  Clear to auscultation bilaterally, no wheezes, no rales.  CARDIOVASCULAR:  S1 & S2.  Regular  rate and rhythm.  ABDOMEN:  Full, positive bowel sounds.  No tenderness, no masses.  EXTREMITIES:  No clubbing, cyanosis, or edema.  NEUROLOGICAL:  Power in both upper and lower extremities 5/5.  Reflexes 2+  bilaterally and symmetrical.  Notable sensory deficit, per patient, and  hyperesthesia on the right side of his body.   LABORATORY DATA ON ADMISSION:  WBC 6, hemoglobin 15.4, hematocrit 44.8, MCV  89.5, platelets 271,000.  Sodium 136, potassium 3.4, chloride 101, carbon  dioxide 27, glucose 139, BUN 10, creatinine 1.1.  LFTs within normal limits.  Calcium 8.9.  Coags showing an INR of 1.  Cardiac markers negative.  EKG  normal sinus rhythm with rate of 69.  CT  head negative for any acute  intracranial process; however, some atrophy and nonspecific white matter  changes noted.   MRA/MRI:  No intracranial stenosis or effusion, negative for aneurysm.   HOSPITAL COURSE:  Problem 1.  TRANSIENT ISCHEMIC ATTACK:  The patient was  admitted to a monitored bed.  Cardiac echo showed no obvious source of  emboli with an  ejection fraction of 55 to 65%.  There were no left  ventricular regional wall motion abnormalities.  The patient was started on  aspirin 325 mg p.o. daily.  His symptoms resolved during the stay of the  hospital course.   Dr. Orlin Hilding, neurology, was advised of the patient's progress and his  hospital course.  She will follow him up as an outpatient within a week.   Problem 2. DYSLIPIDEMIA:  The patient's cholesterol level was 208 with HDL  53 and LDL 140.  The patient is on Welchol; however, he has not been  compliant with his medications.  His TSH was 1.2.  A homocystine level was  16.   Problem 3.  HISTORY OF HYPERTENSION:  The patient states his family has a  strong family history of hypertension.  On the day of discharge, his blood  pressure was 144/90.  He will not be discharged on any antihypertensives at  this time.  This will be deferred to primary care physician, Dr. Dorothyann Peng, who he will be seeing within a week.   The patient was discharged in stable condition.  His vital signs on the day  of discharge:  Blood pressure 144/90, respirations 16, pulse 63, temperature  98.  The patient was ambulatory without assistance.   The patient was advised to return to the emergency room immediately upon  onset of chest pain, shortness of breath, weakness/numbness in his  extremities, or any other symptoms that may need immediate medical  attention.       GDK/MEDQ  D:  08/02/2004  T:  08/02/2004  Job:  [id number]

## 2011-02-26 NOTE — Discharge Summary (Signed)
NAME:  Steven [name], Steven [name]                        ACCOUNT NO.:  [id number]   MEDICAL RECORD NO.:  [id number]                   PATIENT TYPE:  INP   LOCATION:  3015                                 FACILITY:  [facility]   PHYSICIAN:  Melvyn Novas, M.D.               DATE OF BIRTH:  [birth year]   DATE OF ADMISSION:  04/29/2003  DATE OF DISCHARGE:  05/01/2003                                 DISCHARGE SUMMARY   DISCHARGE DIAGNOSES:  1. Left brain transient ischemic attack.  2. Hyperlipidemia.  3. Glaucoma, blind in right eye.  4. Status post appendectomy.  5. Hyperlipidemia.   MEDICATIONS AT DISCHARGE:  1. Aspirin 325 mg a day.  2. Welchol 625 mg a day three pills b.i.d.  3. Lotemax 0.5 __________ one drop left b.i.d.  4. Timoptic 0.5% one drop b.i.d.   STUDIES PERFORMED:  1. CT of the brain on admission:  Negative for acute abnormality. Small     areas of subacute infarct and/or chronic small vessel type changes in     subcortical white matter in the left frontal and parietal lobes.  2. MRI of the brain shows no acute intracranial abnormalities. 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  3. MRA of the brain:  Negative.  4. Carotid Doppler with mild plaque noted throughout. No ICA stenosis.     Vertebral flow antegrade.  5. Two-D echocardiogram shows ejection fraction 50% to 55% with no     diagnostic left ventricular regional wall abnormalities. No intracardiac     emboli was apparent; however, study had poor sensitivity for emboli and     LV wall function.  6. EKG:  Normal sinus rhythm with short PR.   LABORATORY DATA:  Urine drug screen negative. UA normal. Cardiac enzymes  normal with slightly elevated CKs, dropping during hospitalization from 222  to 212 to 194. Hemoglobin 14.6, hematocrit 41.8, white blood cells 6.4,  platelets 216. Sodium 136, potassium 3.6, chloride 103, CO2 28, BUN 9,  creatinine 1.1, glucose  132. Liver functions were within normal limits  except for slightly elevated ALT at 44. Calcium 9.0. Homocysteine normal at  13.4. Sed rate 9. Lupus anticoagulant not detected. Lipid profile with  cholesterol 158, triglycerides 93, HDL 52, and LDL 87.   HISTORY OF PRESENT ILLNESS:  [name] is a 55 year old right  handed black male with a history of dyslipidemia who comes in after an  intermittent right facial and arm numbness and weakness since last Friday  (three days ago). The morning of admission, awoke with numbness of right  face and arm that persisted. He does not complain of weakness now in the  emergency room. He denies visual changes, speech or swallowing problems and  incoordination or language problems. He was admitted for stroke evaluation.   HOSPITAL COURSE:  MRI was  negative for acute stroke. Does show some old  small vessel disease. Two-D and carotid Dopplers were normal. It was felt  that patient had a left brain TIA with symptoms that waxed and waned. The  patient was not on antiplatelet prior to admission and was placed on aspirin  q.d. for antiplatelet therapy and secondary stroke prevention. The patient  also needs to continue risk factor control. This will be referred back to  his primary physician. He did remain with some clumsiness in his right hand,  and outpatient rehab for OT was arranged. The patient discharged home.   CONDITION ON DISCHARGE:  The patient is alert and oriented x3. Speech clear.  He has no aphasia. No facial weakness. He does have slight decreased  sensation in his right arm and face greater than leg. Slightly clumsy with  his right upper extremity with 4/5 distal strength but 5/5 proximally.  Otherwise strength in right lower extremity and left side is normal. The  chest is clear auscultation and heart rate is regular.   DISCHARGE PLAN:  1. Discharged home.  2. Aspirin for secondary stroke prevention.  3. Outpatient OT.  4.  Follow up with primary care physician for routine risk factor control.  5. Follow up with Dr. Vickey Huger in four weeks, call for an appointment.     Annie Main, N.P.                         Melvyn Novas, M.D.    SB/MEDQ  D:  05/02/2003  T:  05/03/2003  Job:  [id number]   cc:   Murtis Sink, M.D.    cc:   Murtis Sink, M.D.

## 2011-02-26 NOTE — H&P (Signed)
NAME:  Steven [name], Steven [name] NO.:  [id number]   MEDICAL RECORD NO.:  [id number]          PATIENT TYPE:  EMS   LOCATION:  MAJO                         FACILITY:  [facility]   PHYSICIAN:  Isidor Holts, M.D.  DATE OF BIRTH:  19-Apr-1956   DATE OF ADMISSION:  10/07/2006  DATE OF DISCHARGE:                              HISTORY & PHYSICAL   PMD:  Robyn N. Allyne Gee, M.D.   PRIMARY CARDIOLOGIST:  Luis Abed, MD, [facility]   CHIEF COMPLAINT:  Recurrent chest pain, recurrent syncope, recent UTI.   HISTORY OF PRESENT ILLNESS:  This is a 55 year old male.  For past  medical history, see below.  According to the patient, he had traveled  to [place] to be with relatives. On October 01, 2006 he went to  the store with his cousin, and while standing talking to him, at about  11:00 p.m. he passed out.  His cousin called the EMS and when he came  to, he was en route to [facility], [address], [place].  He was evaluated for approximately 4 hours, was concluded at  that time, to have chest wall pain, and was discharged with pain  medications.  On October 02, 2006, between 1:30 to 2:00 p.m. he passed  out again.  This time he was evaluated at [facility] in  [place], [place], where he had a chest x-ray among other tests,  was subsequently concluded to have a UTI and was placed on ciprofloxacin  500 mg p.o. b.i.d., which he has been taking since October 02, 2006.  By October 03, 2006, he was feeling quite weak and although he was due  to travel down to [place] on that day, he had to defer his return.  He subsequently passed out again on October 03, 2006, felt that he had  enough and decided to travel back to [place].  He returned to  [facility] and came to the emergency department.  The patient states  that for the past one week in addition, he has been having left-sided  chest pain which he describes as sharp, rates about 8/10, located on  the  left side of his chest, aggravated by deep inspiration, radiates down  into his stomach, up into the side of his neck and down into his arm.  He states that this pain has remained constant for the past 1 week, but  fluctuates in intensity.  Associated occasionally with shortness of  breath and nausea and he vomited x1 today.  However, he also felt that  in the morning that he was feverish, although he has no objective  temperature measurement, he felt quite sweaty.  He denies diarrhea.  He  has had a cough productive of white phlegm for the past 2 days.   PAST MEDICAL HISTORY:  1. TIAs since July 2004.  2. Non-obstructive coronary artery disease, status post cardiac      catheterization, March 2005 by Dr. Samule Ohm.  3. Hypertension.  4. Dyslipidemia.  5. Glaucoma.  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  6. Status post appendectomy.  7. Status post right knee surgery.  8. History of triangle fibrocartilage tear left wrist, status post      arthroscopy and debridement on July 14, 2006.   MEDICATION HISTORY:  1. Ciprofloxacin 500 mg p.o. q.i.d., started October 02, 2006.  2. Crestor 10 mg p.o. daily.  3. Lisinopril 10 mg p.o. daily.  4. Lotemax 0.5% eye drops, 1 drop left eye b.i.d.  5. Cosopt eye drops, 1 drop left eye b.i.d.   ALLERGIES:  NO KNOWN DRUG ALLERGIES, ALTHOUGH PATIENT ADMITS TO GI  INTOLERANCE TO ASPIRIN.   SYSTEM REVIEW:  Essentially as per HPI and chief complaint, otherwise  negative.   SOCIAL HISTORY:  The patient is separated.  He has 3 daughters and 6  sons, according to him.  He is currently on disability and is considered  legally blind.  He used to work in [employer] up until he  went on disability in October 2007.  Nonsmoker and nondrinker.  Denies  drug abuse.  Smokes pot occasionally.   FAMILY HISTORY:  The patient's mother died at age 58 years, status post  MI.  His father died  at age 39, cause not clear.  The patient has no  siblings.   PHYSICAL EXAMINATION:  VITALS:  Temperature 98.8, pulse 76 per minute  and regular, respiratory rate 22, BP 135/95 mmHg, pulse oximeter 100% on  room air.  The patient appears quite comfortable, alert, communicative,  not short of breath at rest.  Does not appear to be in obvious acute  discomfort.  HEENT:  No pallor.  No jaundice.  No conjunctival injection.  Throat is  clear.  NECK:  Supple.  JVP not seen.  No palpable lymphadenopathy.  No palpable  goiter.  CHEST:  Clear to auscultation.  No wheezes.  No crackles.  HEART:  Sounds 1 and 2 heard, normal, regular, no murmurs.  ABDOMEN:  Flat, soft, and nontender.  There is no palpable organomegaly.  No palpable masses.  Normal bowel sounds.  LOWER EXTREMITIES:  No pitting edema.  Palpable peripheral pulses.  MUSCULOSKELETAL:  Quite unremarkable.  CENTRAL NERVOUS SYSTEM:  No focal neurologic deficits on gross  examination.   INVESTIGATIONS:  CBC:  WBC 5.1, hemoglobin 15.7, hematocrit 46.1,  platelets 225,000.  Electrolytes:  Sodium 134, potassium 4.2, chloride  102, CO2 23, BUN 10, creatinine 1.0, glucose 97.  AST 31, ALT 28,  alkaline phosphatase 44, lipase 25.  Urinalysis is negative.  Troponin I  less than 0.05.  EKG dated October 07, 2006 shows sinus rhythm,  regular, 73 per minute, normal axis, widespread high take-off ST, ?  saddle shaped.  However, this is unchanged compared to EKG of  July 06, 2005.  Chest x-ray dated October 07, 2006 shows no acute  cardiopulmonary findings.   ASSESSMENT/PLAN:  1. Atypical chest pain.  Patient has history of non-obstructive      coronary artery disease and also positive family history of      coronary artery disease.  Although the chest pain sounds atypical,      it would be prudent to cycle cardiac enzymes, arrange a 2D     echocardiogram.  The patient may subsequently need a cardiology      consultation for risk  stratification.  In addition, will need to      rule out possible pulmonary embolism, given the fact that the  symptoms occurred after travel to [place] and has been      associated with syncope.  We shall, therefore, do a chest CT      angiogram.   1. Recurrent syncope, query etiology.  We will rule out pulmonary      embolism at this time.  Meanwhile, check orthostatics, check serum      cortisol levels, and commence the patient on intravenous fluids.   1. History of previous transient ischemic attacks.  The patient has in      the past been unable to tolerate Aspirin.  We shall start the      patient on Plavix as he has risk factors for macrovascular disease.   1. Recent urinary tract infection.  The patient has now completed a      five day course of ciprofloxacin.  We will discontinue      ciprofloxacin and check urinalysis.   Further management will depend on the clinical course.      Isidor Holts, M.D.  Electronically Signed     CO/MEDQ  D:  10/07/2006  T:  10/08/2006  Job:  [id number]   cc:   Candyce Churn. Allyne Gee, M.D.  Luis Abed, MD, [facility]

## 2011-02-26 NOTE — Discharge Summary (Signed)
NAME:  Steven [name], Steven [name] NO.:  [id number]   MEDICAL RECORD NO.:  [id number]          PATIENT TYPE:  INP   LOCATION:  3712                         FACILITY:  [facility]   PHYSICIAN:  Michaelyn Barter, M.D. DATE OF BIRTH:  1956/08/21   DATE OF ADMISSION:  10/07/2006  DATE OF DISCHARGE:  10/11/2006                               DISCHARGE SUMMARY   PRIMARY CARE PHYSICIAN:  Robyn N. Allyne Gee, M.D.   CARDIOLOGIST:  Luis Abed, MD, [facility], [facility].   FINAL DIAGNOSES:  1. Chest pain.  2. Abdominal pain.  3. Small amount of rectal bleeding.  4. Syncopal episode.  5. Urine drug screen positive for marijuana.   CONSULTATIONS:  1. [facility] with Dr. Eden Emms.  2. Neurology with Dr. Ellison Carwin.   PROCEDURES:  1. Chest x-ray, two-view, completed December28,2007.  2. CT scan of the head without contrast completed December29,2007.  3. CT scan of the chest with angiographic contrast completed      December29,2007.  4. MRI/MRA of the brain completed December29,2007.  5. Echocardiogram completed December29,2007.  6. Portable abdominal x-ray completed December31,2007.  7. Nuclear medicine myocardial stress test completed December31,2007.  8. CT scan of the abdomen and pelvis completed December31,2007   HISTORY OF PRESENT ILLNESS:  Mr. Mehra is a 55 year old male who  stated that on December22,2007 he went to the store with his cousin and  while standing and talking to him at approximately 11 o'clock p.m. he  passed out.  EMS was called.  He was taken to the hospital in [place] where he was at at the time and was evaluated.  He was told  that he had chest wall pain was given pain medication and discharged.  On December23,2007, the patient had a second episode of syncope.  He  went to a second hospital.  A chest x-ray was completed.  The patient  was diagnosed with a UTI and started on ciprofloxacin.  He began to feel  weak but traveled to  [place].  On December24,2007, the patient has a  third syncopal episode; therefore, he came to the emergency department  at [facility] to be evaluated.  The patient complained of some left-  sided chest pain described as sharp with radiation and intensity of  approximately 8/10 and located over the left side of his chest,  aggravated by deep inspirations.  It radiated down into his stomach, up  into the neck, and down his arm.   For past medical history please see that dictated by Dr. Isidor Holts.   ASSESSMENT/PLAN:  1. Chest pain.  It was determined that there were some atypical      features to the patient's chest pain.  The patient's cardiac      enzymes were cycled and a cardiology consultation took place.      [facility] was consulted.  Dr. Dietrich Pates initially saw the      patient.  His assessment was that although the patient's story had      some characteristic features of myocardial ischemia, he has had  insignificant coronary artery disease in the past with similar      symptoms.  It was noted that the patient did have an elevated CPK      of uncertain etiology.  Dr. Dietrich Pates indicated that this could be      related to the multiple trauma secondary to the falls.  A chest x-      ray was completed on December28, 2007.  It revealed no acute chest      findings.  A CT scan with angiographic contrast was completed on      December29, 2007.  There was no evidence of acute pulmonary      embolism or other acute chest process.  Faint coronary artery      calcifications were seen.  A 2-D echocardiogram was completed on      December29,2007.  Overall left ventricular systolic function was at      the lower limits of normal.  Left ventricular EF was estimated to      be 55%.  There was possible hypokinesis of the periapical wall.      Left ventricular wall thickness was mildly increased.  A nuclear      medicine stress test was completed.  It revealed probably  negative      pharmacologic stress nuclear study revealing no significant stress-      induced EKG abnormalities, normal left ventricular size, and a      minimally depressed left ventricular systolic function in a      nonsegmental pattern.  By the date of the patient's discharge from      the hospital, he indicated that his chest discomfort was      significantly better as well as abdominal discomfort.  2. Syncopal episode.  The etiology of this was questionable.  The      patient did have a CT scan of his head completed without contrast      on December29,2007.  It revealed scattered foci of white matter      signal abnormality, likely chronic microvascular ischemic change,      but acute stroke could not be excluded.  No hemorrhage, midline      shift, or extra-axial fluid collections were seen.  An MRI of the      brain was done on December29,2007.  There was no acute stroke,      stable but extensive chronic microvascular ischemic change      affecting not only the white matter but also the deep nuclei and      brainstem were noted.  No abnormal intracranial enhancement was      seen.  Unremarkable MRA of the intracranial circulation was noted.      The patient did not have any repeat episodes of syncope throughout      the remaining portions of his hospitalization.  3. Abdominal pain.  A portable abdominal x-ray was completed on      December31, 2007.  It revealed normal bowel gas pattern, possible      left nephrolithiasis.  A CT scan of the patient's abdomen and      pelvis were completed on December31,2007.  The impression of the CT      scan of the abdomen was that there was bilateral hydronephrosis and      hydroureter without evidence for high-grade obstruction.  Left      nephrolithiasis was seen.  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  With regards to the CT scan of the     pelvis, no acute pelvic CT findings were seen.  By the day of       discharge, the patient's abdominal pain had resolved.  4. Some slight blood on the patient's toilet paper.  The patient      indicated that on the morning of January1,2008 after having a bowel      movement, he had some blood on the tissue, but otherwise, there was      no significant amount of bleeding.  He also stated that he had a      history of hemorrhoids in the past and that this was probably the      source.  The patient's hemoglobin remained stable.  No further      workup was completed.  5. His urine drug screen positive for marijuana.   CONDITION ON DISCHARGE:  On the day of discharge, the patient indicated  that he felt better.  His chest discomfort and abdominal discomfort were  significantly better.   His vital signs indicated a temperature of 97.8, heart rate 54,  respirations 20, blood pressure 130/79, O2 saturation 100% on room air.   His white blood cell count was 5.2, hemoglobin 15.2, hematocrit 43.8,  platelets 210.  Sodium 140, potassium 4.4, chloride 103, CO2 31, BUN 11,  creatinine 1.2, glucose 116.  Bilirubin total 0.9, amylase 138, lipase  27, total protein 7.5, albumin 4.0, calcium 9.6, alk phos 38, SGOT 27,  SGPT 24.   The decision was made to discharge the patient from the hospital.   DISCHARGE MEDICATIONS:  1. Plavix 75 mg once a day.  2. Crestor 10 mg once a day.  3. Lisinopril 10 mg once a day.  4. Protonix 40 mg once a day.   The patient was told to resume all of his prior home medications.  He  was also told to see Dorothyann Peng within one to two weeks and to follow  up with Dr. Charna Elizabeth and to call her for an appointment.      Michaelyn Barter, M.D.  Electronically Signed     OR/MEDQ  D:  11/15/2006  T:  11/15/2006  Job:  [id number]

## 2011-02-26 NOTE — Discharge Summary (Signed)
NAME:  Steven [name], Steven [name]              ACCOUNT NO.:  [id number]   MEDICAL RECORD NO.:  [id number]          PATIENT TYPE:  INP   LOCATION:  3733                         FACILITY:  [facility]   PHYSICIAN:  Mallory Shirk, MD     DATE OF BIRTH:  Feb 18, 1956   DATE OF ADMISSION:  07/30/2004  DATE OF DISCHARGE:  08/02/2004                                 DISCHARGE SUMMARY   ADDENDUM:  The patient was seen by Dr. Chiquita Loth at [facility] in the week  prior to this admission.  The patient will be followed up by Dr. Chiquita Loth for  his glaucoma and blindness in his right eye.   DISCHARGE MEDICATIONS:  The following  have been added:  1.  Folic acid 1 mg p.o. daily.  2.  Timoptic ophthalmic drops in the eyes as ordered by Dr. Chiquita Loth.  3.  Lotemax ophthalmic drops as offered by Dr. Chiquita Loth.   The patient has these medications at home and prescriptions have not been  provided for the ophthalmic medications.       GDK/MEDQ  D:  08/02/2004  T:  08/02/2004  Job:  [id number]

## 2011-02-26 NOTE — Discharge Summary (Signed)
NAME:  Steven [name], Steven [name]                        ACCOUNT NO.:  [id number]   MEDICAL RECORD NO.:  [id number]                   PATIENT TYPE:  INP   LOCATION:  3703                                 FACILITY:  [facility]   PHYSICIAN:  Willa Rough, M.D.                  DATE OF BIRTH:  10/11/1956   DATE OF ADMISSION:  01/02/2004  DATE OF DISCHARGE:  01/03/2004                                 DISCHARGE SUMMARY   DISCHARGE DIAGNOSES:  1. Chest pain, etiology unclear.  2. Minimal coronary artery disease by catheterization, this admission.  3. Ejection fraction 70%.  4. Questionable history of hypertension.  5. Hypercholesterolemia.  6. Family history of coronary artery disease.  7. History of appendectomy.   PROCEDURES PERFORMED THIS ADMISSION:  Cardiac catheterization by Dr. Salvadore Farber revealing minimal coronary artery disease and an ejection fraction  of 70% -- please see dictated note for complete details.   HOSPITAL COURSE:  Please see the admission history and physical for complete  details.  Briefly, this 55 year old male with no known history of coronary  artery disease and a history of hypercholesterolemia and possible history of  hypertension, presented to the [facility] Emergency Room on January 02, 2004  with substernal chest discomfort radiating to her left arm associated with  shortness of breath, nausea and vomiting.  She had J point elevation noted  on her electrocardiogram.  She was taken to the catheterization lab for  further assessment of her chest discomfort.  As noted above, she had minimal  coronary artery disease noted on her catheterization.  There was no cardiac  explanation for her chest discomfort.  There was no evidence of aortic  dissection.  A D-dimer was checked and this was negative at 0.28.  Dr.  Willa Rough saw the patient on January 03, 2004.  He felt she was ready for  discharge to home.  Since the D-dimer was normal, there was no need for a  V/Q  scan.   FOLLOWUP:  The patient would need no cardiac followup in the future.  She  has been urged to follow up with her primary care physician in the next 1-2  weeks.      Tereso Newcomer, P.A.                        Willa Rough, M.D.    SW/MEDQ  D:  01/03/2004  T:  01/03/2004  Job:  [id number]   cc:   Candyce Churn. Allyne Gee, M.D.  [address]  [address]  [place]  [place] [address]  Fax: [phone]   Willa Rough, M.D.

## 2011-02-26 NOTE — Cardiovascular Report (Signed)
NAME:  Steven [name], Steven [name]                        ACCOUNT NO.:  [id number]   MEDICAL RECORD NO.:  [id number]                   PATIENT TYPE:  INP   LOCATION:  3703                                 FACILITY:  [facility]   PHYSICIAN:  Salvadore Farber, M.D. [facility]         DATE OF BIRTH:  [birth year]   DATE OF PROCEDURE:  01/02/2004  DATE OF DISCHARGE:  01/03/2004                              CARDIAC CATHETERIZATION   PROCEDURES PERFORMED:  1. Left heart catheterization.  2. Left ventriculography.  3. Coronary angiography.  4. Arch aortography.  5. Angio-Seal closure of right femoral artery.   CARDIOLOGIST:  Salvadore Farber, M.D.   INDICATIONS:  Mr. Mase is a 55 year old man with cardiac risk factors of  dyslipidemia, positive family history and former tobacco use who presented  to the hospital approximately one month ago with chest discomfort and was  discharged to home.  He presented today to the emergency room with recurrent  and ongoing chest discomfort.  Electrocardiogram demonstrated what appears  to be normal early repolarization but cannot rule out anterior myocardial  infarction.  He was referred by Dr. Myrtis Ser for emergent angiography to  establish a clear diagnosis.   PROCEDURAL TECHNIQUE:  Informed consent was obtained.  Under 1% lidocaine  local anesthesia a 6 French sheath was placed in the right common femoral  artery using the modified Seldinger technique.  Diagnostic angiography and  ventriculography were hen performed using JL-4, JR-4 and pigtail catheters.  The atrial catheter was then positioned in the aortic root and arch  aortography performed by power injection to exclude dissection.  Finally,  after sheath injection demonstrated the sheath entered the common femoral  artery above the level of the bifurcation, the arteriotomy was closed using  a 6 [place] Angio-Seal device.  Complete hemostasis was obtained.   The patient was then transferred to the holding room in  stable condition  having tolerated the procedure well.   COMPLICATIONS:  None.   FINDINGS:  1. LV:  EF 70% without regional wall motion abnormality.  No aortic stenosis     or mitral regurgitation.  No evidence of aortic dissection.   1. Left Main:  The left main is angiographically normal.   1. LAD:  The LAD is a very large vessel wrapping the apex of the heart and     giving rise to single moderate-size diagonal branch.  There is a 40%     stenosis of the ostium of the LAD and a 30% stenosis at the mid LAD after     the takeoff of the diagonal.   1. Circumflex:  The circumflex is a large vessel giving rise to four     __________ obtuse marginals.  There were minor luminal irregularities.   1. RCA:  The RCA is a moderate-sized dominant vessel.  There is a 20%     stenosis of the proximal vessel.   IMPRESSION AND RECOMMENDATIONS:  1. Angiographically  mild coronary disease with normal flow to all     territories despite ongoing chest pain.  2. No evidence of aortic dissection.  3. Normal left ventricular size and systolic function.   I suspect noncardiac etiology for his chest discomfort.   The patient will be hospitalized and evaluation for noncardiac sources of  his chest pain further explored.                                               Salvadore Farber, M.D. [facility]    WED/MEDQ  D:  01/17/2004  T:  01/19/2004  Job:  [id number]

## 2011-02-26 NOTE — Op Note (Signed)
NAME:  Steven [name], Steven [name]              ACCOUNT NO.:  [id number]   MEDICAL RECORD NO.:  [id number]          PATIENT TYPE:  AMB   LOCATION:  DSC                          FACILITY:  [facility]   PHYSICIAN:  Katy Fitch. Sypher, M.D. DATE OF BIRTH:  1956-08-24   DATE OF PROCEDURE:  01/11/2006  DATE OF DISCHARGE:                                 OPERATIVE REPORT   PREOPERATIVE DIAGNOSIS:  1.  MRI proven triangle fibrocartilage tear.  2.  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   POSTOPERATIVE DIAGNOSIS:  1.  MRI proven triangle fibrocartilage tear.  2.  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   OPERATION:  1.  Diagnostic arthroscopy left wrist.  2.  Arthroscopic debridement of triangle fibrocartilage peripheral      degenerative tear.  3.  Left wrist arthrotomy with triangulation to identify osteoid osteoma      utilizing 18 and 27 gauge needles followed by curettage of osteoid      osteoma.  4.  Autogenous left distal radial bone graft to cavity created by osteoid      osteoma curettage.   SURGEON:  Katy Fitch. Sypher, M.D.   ASSISTANT:  None.   ANESTHESIA:  General by LMA.   SUPERVISING ANESTHESIOLOGIST:  Guadalupe Maple, M.D.   INDICATIONS:  Steven [name] is a 55 year old gentleman employed by the  [employer].  He was referred for an upper extremity orthopedic  consult due to chronic disabling left wrist pain.  Clinical examination had  suggested a possible internal derangement of the wrist.  Plain films were  nondiagnostic except for what appeared to be a small density in the radial  central lunate that could have represented a nidus of an osteoid osteoma.  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  An  MRI of the wrist was obtained with and without contrast and, once again,  confirmation of the osteoid osteoma was accomplished as well as  identification of a degenerative peripheral triangular fibrocartilage tear.  We recommended that [name] proceed to diagnostic arthroscopy at this  time anticipating arthroscopic debridement of his triangular fibrocartilage  tear.  With respect to the osteoid osteoma, we completed a detailed informed  consent, both in the office and in the preoperative holding area.  Mr.  Goodley understands that this can be a very challenging lesion to  eradicate, especially when it is present in a very small carpal bone with a  tenuous blood supply.  Our plan will be to approach the osteoid osteoma with  a very minimal curettage, taking care not to destroy the architecture of the  lunate, anticipating creation of a very small drill hole in the dorsal  aspect of the lunate directly over the lesion, and triangulating to find the  nidus followed by microcurettage of the lesion and autogenous bone grafting.  As the lesion is directly adjacent to the scapholunate interosseous  ligament, there is the significant risk of a pathologic fracture and/or  development of later avascular necrosis of the lunate.  I have discussed  this lesion with a regional bone tumor specialist as well as consulted the  literature.  Should he have residual pain following our attempts to  eradicate this osteoid osteoma, he would be a proper candidate for a  proximal row carpectomy as a salvage procedure.  After informed consent, Mr.  Ledwith is brought to the operating room at this time.   PROCEDURE:  [name] is brought to the operating room and placed in  supine position on the table.  Following an anesthesia consultation by Dr.  Noreene Larsson, general anesthesia by LMA was selected.  He was transported to room  2 and placed in supine position  upon the operating table and general  anesthesia by LMA technique was accomplished.  The left arm was prepped with  Betadine soap solution and sterilely draped.  A pneumatic tourniquet was  applied to the proximal left brachium.  Following exsanguination of the left  arm with an Esmarch bandage, the arterial tourniquet on the proximal  brachium inflated to 220 mmHg.  The procedure commenced with distraction of  left wrist in the tower designed for wrist arthroscopy.  Counter traction  was applied the forearm and finger traps to the index and long fingers.  10  pounds traction was applied.   The scope was introduced through a standard 3/4 dorsal portal after sounding  with an 18 gauge needle and distension of the wrist with 5 mL of sterile  saline.  The scope was placed atraumatically.  Diagnostic arthroscopy  revealed intact hyaline articular cartilage surfaces on the scaphoid,  lunate, and triquetrum.  There was a partial dorsal left lunatotriquetral  interosseous ligament tear.  There is considerable synovitis and debris at  the peripheral triangular fibrocartilage.  A 6R portal was created and a 2.9-  mm suction shaver was used to debride the synovitis and a degenerative  peripheral triangular fibrocartilage tear that was in its ulnar midportion  and palmar.  A small portion of the pisotriquetral joint was involved.  The  dorsal aspect of the triangular fibrocartilage including the dorsal radial  ulnar ligament was intact.  The volar radial ulnar ligament was mildly  compromised.  The lunatotriquetral interosseous ligament was intact in its  palmar surface and the scapholunate ligament was normal.  The ulnocarpal  ligaments were normal.  The radiocarpal ligaments were normal.  After  completion of the debridement, the arthroscopic equipment was removed  followed by removal of the hand and wrist from the arthroscopic tower.  The 3/4 portal was extended to a transverse arthrotomy  incision.  The fourth  dorsal compartment was exposed by release of the distal centimeter of the  extensor retinaculum followed by retraction of the fourth dorsal compartment  tendons in an ulnar direction and the second dorsal compartment tendons in a  radial direction utilizing a micro-Gelpi retractors.  A transverse  arthrotomy was performed sparing the posterior interosseous nerve.  With the  aid of a 27 and 18 gauge needle, the radial third of the lunate was  identified followed by use of a tapered bone awl to drill into the lunate  creating a 2 mm diameter hole directly to the point of the nidus based on  multiple C-arm images.  The C-arm was used in the magnification mode to  facilitate proper identification of the osteoid osteoma.  A House microcuret  was then used to thoroughly curet the radial third of the lunate.  Ivory  like avascular bone was removed followed by eventual identification of the  probable nidus.  A highly vascular segment of bone was removed as we worked  our way towards the scaphoid and scapholunate interosseous ligament region.  Great care was taken to avoid a pathologic fracture of the lunate.  Multiple  C-arm images were used throughout the curettage to ensure proper position.  After the bone removal revealed no further vascular nidus tissue, I elected  to proceed with autogenous distal radial grafting an effort to heal the  lunate in a more rapid manner.   A 2 cm transverse incision was fashioned over the second dorsal compartment.  The subcutaneous tissues were carefully divided revealing the second dorsal  compartment.  The extensor carpi radialis longus and brevis tendons were  retracted with a micro-Gelpi followed by creation of a 2 by 2 by 4 by 4  window in the dorsum of the radius.  Approximately 0.5 cubic mL of cancellus  bone was removed with the microcuret and subsequently morselized and packed  within the cavity created in the lunate by removal of  the osteoid osteoma.  After this bone was tapped into position, AP and lateral C-arm images were  obtained with a 27 gauge needle in place documenting what appeared to be a  satisfactory position of the graft.  The wounds were then irrigated and the  donor site at the distal radius repaired with intradermal 3-0 Prolene.  The  extensor retinaculum was repaired with a mattress suture of 3-0 Ethilon  followed by repair of the skin with intradermal 3-0 Prolene.  There no  apparent complications.  Mr. Brunton tolerated surgery and anesthesia well.  He will be transferred to the recovery room for observation of his vital  signs.   We anticipate discharge in the care of his family with prescriptions for  Dilaudid 2 mg, 1-2 tablets p.o. q.4-6h. p.r.n. pain, Motrin 600 mg, 1 p.o.  q.6h. p.r.n. pain, and Keflex 500 mg, p.o. q.8h. x 4 days as a prophylactic  antibiotic.      Katy Fitch Sypher, M.D.  Electronically Signed    RVS/MEDQ  D:  01/11/2006  T:  01/11/2006  Job:  [id number]   cc:   Candyce Churn. Allyne Gee, M.D.  Fax: [phone]

## 2011-02-26 NOTE — Consult Note (Signed)
NAME:  Steven [name], Steven [name]              ACCOUNT NO.:  [id number]   MEDICAL RECORD NO.:  [id number]          PATIENT TYPE:  INP   LOCATION:  3712                         FACILITY:  [facility]   PHYSICIAN:  Gerrit Friends. Dietrich Pates, MD, FACCDATE OF BIRTH:  [birth year]   DATE OF CONSULTATION:  10/08/2006  DATE OF DISCHARGE:                                 CONSULTATION   REFERRING PHYSICIAN:  Marcellus Scott, M.D.   PRIMARY CARE PHYSICIAN:  Robyn N. Allyne Gee, M.D.   PRIMARY CARDIOLOGIST:  Luis Abed, MD, [facility].   HISTORY OF PRESENT ILLNESS:  A 55 year old gentleman with multiple prior  presentations with chest discomfort but negative coronary angiography in  2005, presents with recurrent chest discomfort and syncope.  Mr.  [name] has a history of hyperlipidemia and borderline hypertension.  He has not been a cigarette smoker.  He was admitted and evaluated by [place]  in March 2005.  Cardiac catheterization at that time revealed no  valvular disease, normal left ventricular systolic function, and  insignificant coronary disease with a 40% LAD lesion and a 20% RCA  lesion.  Steven [name] returned on a number of occasions in 2005 and  2006, to the emergency department with chest discomfort.  He had  associated sensory neurologic findings on one occasion and was thought  to have suffered a TIA.  MRI study at that time revealed a retinal  detachment and ischemic changes in the basal ganglia.  He experienced  nausea on two occasions last weekend associated with loss of  consciousness and falling to the ground.  There was no injury.  He had a  recurrent episode yesterday prompting presentation to the emergency  department.  He has had vague chest discomfort, perhaps characterized as  fullness, without associated nausea, dyspnea, nor diaphoresis.  He has  had a sense of increased sputum production.  His chest symptoms have  recurred over the past week.  They are episodic and of moderate  severity.  There is  no clear relationship to exertion.   PAST MEDICAL HISTORY:  Otherwise notable for:  1. 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  2. He has glaucoma.  3. 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  4. He has some modest residual vision on the left.  5. He has had a remote appendectomy.  6. Knee surgery.  7. Repair of a wrist injury.   MEDICATIONS PRIOR TO ADMISSION:  1. Simvastatin 10 mg daily.  2. Lisinopril 10 mg daily.  3. A number of eye drops.  4. Ciprofloxacin 500 mg b.i.d.   THE PATIENT HAS NO KNOWN MEDICATION ALLERGIES.  HE HAS GI INTOLERANCE  WITH ASPIRIN.   SOCIAL HISTORY:  Currently unemployed due to his visual problems;  married and lives in [place]; [place] pack-year history of tobacco use  that was discontinued 5 years ago.  No excessive alcohol; no illicit  drugs.   FAMILY HISTORY:  Positive for coronary disease.  His mother suffered a  fatal myocardial infarction at age 36.  None of his siblings have  coronary disease.   REVIEW OF SYSTEMS:  Notable for upper and lower partial dentures.  All  other systems reviewed and are negative.   PHYSICAL EXAMINATION:  GENERAL:  A pleasant gentleman in no acute  distress.  VITAL SIGNS:  The blood pressure is 140/80, heart rate 70 and regular,  respirations 18, O2 saturation 100% on room air, temperature 98.1.  HEENT:  EOMs full; anicteric sclerae; normal lids and conjunctivae;  normal oral mucosa.  NECK:  Mild jugular venous distension; normal carotid upstrokes without  bruits.  ENDOCRINE:  No thyromegaly.  HEMATOPOIETIC:  No adenopathy.  SKIN:  No significant lesions.  LUNGS:  Clear.  CARDIAC:  Normal first heart sounds; increased intensity of second heart  sounds; fourth heart sound present.  ABDOMEN:  Soft and nontender; no organomegaly; no masses; normal bowel  sounds.  EXTREMITIES:  No edema; normal distal pulses.  NEUROMUSCULAR:  Normal cranial nerves; symmetric strength and  tone.  MUSCULOSKELETAL:  No joint deformities.  PSYCHIATRIC:  Alert and oriented; normal affect.   EKG:  Normal sinus rhythm; right axis deviation; left atrial  abnormality; LVH; J-point elevation consistent with early  repolarization.   CHEST X-RAY:  Unremarkable.   LABORATORY:  Notable for CPK of approximately 1000 with negative MB and  troponin.   IMPRESSION:  Steven [name] returns with chest discomfort.  Although this  has some characteristics of myocardial ischemia, he has had  insignificant coronary disease in the past with similar symptoms.  An  outpatient stress nuclear study can be arranged.  Alternative diagnoses  including a gastrointestinal etiology should be considered.  A D-dimer  level will be obtained.   The patient's episodes for current syncope preceded by nausea are  suggestive of a neurocardiogenic etiology.  Other possibilities include  arrhythmia or orthostatic hypotension.  Continued monitoring is  appropriate.  Lying and standing blood pressures will be obtained.  If  assessment is negative, an outpatient event recorder and tilt table  testing would be appropriate.   The patient has elevated CPK of uncertain etiology.  This could be  related to muscle trauma secondary to falls.  TSH level will be  obtained.   We greatly appreciate the request for consultation and will be happy to  follow this gentleman with you.      Gerrit Friends. Dietrich Pates, MD, [facility]  Electronically Signed     RMR/MEDQ  D:  10/08/2006  T:  10/08/2006  Job:  [id number]

## 2011-02-26 NOTE — Procedures (Signed)
[place]  Patient:    Steven [name], Steven [name]. Visit Number: [id number] MRN: [id number]          Service Type: Attending:  Anselmo Rod, M.D. Dictated by:   Anselmo Rod, M.D. Proc. Date: 08/04/01   CC:         Velna Hatchet, M.D.   Procedure Report  DATE OF BIRTH:  January 25, 1956  REFERRING PHYSICIAN:  Velna Hatchet, M.D.  PROCEDURE PERFORMED:  Colonoscopy.  ENDOSCOPIST:  Anselmo Rod, M.D.  INSTRUMENT USED:  Olympus video colonoscope.  INDICATIONS FOR PROCEDURE:  Rectal bleeding in a 55 year old African-American male.  Rule out colonic polyps, masses, hemorrhoids, etc.  PREPROCEDURE PREPARATION:  Informed consent was procured from the patient. The patient was fasted for eight hours prior to the procedure and prepped with a bottle of magnesium citrate and a gallon of NuLytely the night prior to the procedure.  PREPROCEDURE PHYSICAL:  The patient had stable vital signs.  Neck supple. Chest clear to auscultation.  S1, S2 regular.  Abdomen soft with normal bowel sounds.  DESCRIPTION OF PROCEDURE:  The patient was placed in the left lateral decubitus position and sedated with 40 mg of Demerol and 4 mg of Versed intravenously.  Once the patient was adequately sedated and maintained on low-flow oxygen and continuous cardiac monitoring, the Olympus video colonoscope was advanced from the rectum to the cecum without difficulty. Except for internal hemorrhoids seen on retroflexion, no other abnormalities were seen.  No masses, polyps, erosions, ulcerations or diverticula were present.  The patient tolerated the procedure well without complications.  IMPRESSION:  Normal colonoscopy up to the cecum except for small internal hemorrhoids.  RECOMMENDATIONS: 1. A high fiber diet has been suggested for the patient . 2. Liberal fluid intake along with fiber has been recommended. 3. Repeat colorectal cancer screening is recommended at the  age of 89 unless    the patient were to develop any abnormal symptoms in the interim. 4. Outpatient follow-up on a p.r.n. Dictated by:   Anselmo Rod, M.D.  Attending:  Anselmo Rod, M.D. DD:  12/24/01 TD:  12/25/01 Job: [id number]

## 2011-02-26 NOTE — H&P (Signed)
NAME:  Steven, [name] NO.:  [id number]   MEDICAL RECORD NO.:  [id number]          PATIENT TYPE:  EMS   LOCATION:  MAJO                         FACILITY:  [facility]   PHYSICIAN:  Lonia Blood, M.D.      DATE OF BIRTH:  February 07, 1956   DATE OF ADMISSION:  07/30/2004  DATE OF DISCHARGE:                                HISTORY & PHYSICAL   PRIMARY CARE PHYSICIAN:  Dorothyann Peng, M.D.   CARDIOLOGIST:  Willa Rough, M.D. of [facility]   NEUROLOGIST:  Gustavus Messing. Orlin Hilding, M.D.   PRESENTING COMPLAINT:  Left-sided weakness and numbness for 3 days, and  headache.   HISTORY OF PRESENT ILLNESS:  Steven [name] is a 55 year old African-American  man with past medical history significant for previous transient ischemic  attack, dyslipidemia, and questionable hypertension as well as minimal  coronary artery disease.  He came in today secondary to persistent left-  sided numbness involving the left side of his face, his upper and lower  extremities and associated chest pain.  The patient apparently had a similar  episode last year.  However per record it was on the right hand side.  Initial work up then did not indicate any definite cause of his symptoms  although there was CT finding that may have suggested a stroke.  The patient  has had this numbness continuously this time around.  He has also had some  chest pain which is spasmodic, lasts over 5 seconds, and described the  patient as electric shock that comes and goes.  He denies any nausea,  vomiting or diarrhea.  Denied any shortness of breath and denied any history  of falls or trauma at this point.   PAST MEDICAL HISTORY:  1.  Significant for transient ischemic attack in July, 2004 and was admitted      to the stroke service.  2.  Minimal coronary artery disease status post catheterization in March,      2005 by Dr. Samule Ohm.  3.  History of appendectomy.  4.  Questionable hypertension.  5.  Dyslipidemia.  6.   Family history of coronary artery disease.  7.  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  8.  Right knee surgery.  9.  Eye surgery.   ALLERGIES:  No known drug allergies.   MEDICATIONS:  Welchol mainly which he has apparently done sparingly.   SOCIAL HISTORY:  The patient is married and lives in [place] with his  wife.  The patient works at the  ______ [employer].  Denied any tobacco or alcohol use,  also no drug use.   FAMILY HISTORY:  His mother died at 45 years of age from a myocardial  infarction.  His father died at age 73 with no history of coronary artery  disease.  The patient has no brothers or sisters.   REVIEW OF SYSTEMS:  GENERAL:  The patient denied any weight gain or weight  loss, fever, or any constitutional symptoms.  HEENT:  The patient is blind  in his right eye but denied any change with regards his left eye.  CHEST:  The patient denies any shortness of breath, denies any cough.  CARDIOVASCULAR:  The patient denied any paroxysmal nocturnal dyspnea,  orthopnea or pedal edema.  ABDOMEN:  Denies any nausea, vomiting, diarrhea,  constipation or hematochezia.  MUSCULOSKELETAL:  The patient denied any  current joint pains except for his weakness.  NEURO:  The patient mainly has  the headache and the weakness as in HPI.   PHYSICAL EXAMINATION:  VITAL SIGNS:  Temperature is 98.3, blood pressure  137/47 with a pulse of 70 sitting, 147/75 with a pulse of 70 lying.  Respiratory of 20.  Saturations 98% on room air.  GENERAL:  The patient is lying supine.  Conversant, in no acute distress.  Seems to be overweight.  HEENT:  The patient has bilateral conjunctival injection; 4 mm, cloudy  cornea especially on the right.  NECK:  Supple, no JVD, no lymphadenopathy.  CHEST:  Clear to auscultation bilaterally with good air entry.  No audible  rales.  CARDIOVASCULAR:  Regular rate and rhythm S1, S2.  ABDOMEN:  Full,  obese, nontender with positive bowel sounds.  EXTREMITIES:  Show no clubbing, cyanosis or edema.  NEURO EXAM:  Power in both upper and lower extremities seems to be 5 out of  5, more so on right than left.  His reflexes seem to be 2+ bilaterally.  There is noticeable sensory deficit per patient and hyperesthesia on the  right side of his body.   LABORATORY DATA:  White count of 6000, hemoglobin 15.4, and platelets  271,000.  Sodium is 136, potassium 3.4, chloride 101, CO2 27, glucose 139,  BUN 10, creatinine 1.1 with normal liver function tests and calcium of 8.9.  His coags showed an INR of 1.  Initial cardiac markers showed a troponin of  less than 0.05.  Urinalysis is essentially negative.   EKG shows normal sinus rhythm with a rate of 69, normal intervals. Evidence  of probable elements of left ventricular hypertrophy.  Possible elements of  repolarization by voltage criteria LV is a possibility also.  Chest x-ray is  pending at this point.  CT of head is negative for bleed or any acute  process but atrophy and nonspecific white matter changes.   ASSESSMENT:  This is a 55 year old gentleman with previous history of  transient ischemic attack as well as dyslipidemia, presenting with left-  sided weakness but mainly numbness at this point.  This has been consistent permanently for about 3 days.  The patient's CT did  not show any active hemorrhage.  Most likely picture based on patient's  history and his risk factor, is that of possible recurrent transient  ischemic attack, at this time on the left.  His previous work up included  carotid Dopplers performed last year that showed just mild plaque noted but  no internal carotid artery stenosis.  The patient had a 2D echo that showed  an ejection fraction of 50-55% with no emboli seen at that time.  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 The patient has not been  taking any aspirin since  his TIA last year which may have contributed to what is happening today.   He also has chest pain which seemed to be atypical and related to his  numbness on the left side.  Will therefore admit patient for further work up  of possible transient ischemic attack and rule out myocardial infarction.   PLAN:  1.  Left-sided numbness.  Will admit the patient, repeat his MRI/MRA, and      compare it to previous MRI/MRA of last year.  The patient had a 2D      echocardiogram last year as mentioned but there is no audible murmur      now.  Will consider repeating the 2D echocardiogram appropriately.  Dr.      Thad Ranger has already been called regarding patient's care.  Neurology      has seen patient last and we probably will re-consult him at some point      if there is no improvement.  Meanwhile will start patient on some      aspirin daily, and await the results of the MRI.  Will also start some      physical therapy/occupational on patient.   1.  Chest pain, nausea and vomiting.  The patient has had minimal coronary      artery disease according to his catheterization done last year.  Chest      pain could be musculoskeletal but the cause could also be cardiac,      especially with his strong family history.  He does not smoke right now      although he smoked many years ago.  Will therefore proceed to rule out      myocardial infarction in the patient while he is in the hospital.   1.  Dyslipidemia.  The patient said he has been taking his Welchol for his      dyslipidemia.  His last cholesterol check according to him was 6 months ago.  We will  therefore proceed to recheck his first lipids now.  Other tests rechecked  will be his TSH, B12, and folate.  Will also check urine drug screen in patient to rule out chemical induced  symptoms.   1.  Questionable hypertension.  The patient's blood pressure is borderline      at this point.  He said he has had a history  of possible hypertension in      the past and he has a strong family history.  However there is no      evidence that he is constantly hypertensive.  In lieu of his minimal      coronary artery disease will be cautiously starting some beta blockers      and sublingual nitroglycerin at this point which should both help with      blood pressure as well as to reduce myocardiac demand.  In case he has      myocardial infarction.       LG/MEDQ  D:  07/31/2004  T:  07/31/2004  Job:  [id number]   cc:   Candyce Churn. Allyne Gee, M.D.  [address]  [address]  [place]  [place] [address]  Fax: [phone]   Willa Rough, M.D.

## 2011-02-26 NOTE — Procedures (Signed)
REQUESTING PHYSICIAN:  Dr. Ellison Carwin.   CLINICAL HISTORY:  A 55 year old man with syncopal spells.  EEG was  performed for evaluation.  This is a routine EEG done with photic  stimulation but not hyperventilation.  The patient described as awake  and drowsy.   DESCRIPTION:  The rhythms throughout this tracing are all of very low  amplitude, however, there does appear to be a background rhythm of 10-11  Hz which appears intermittently in posterior areas.  In addition, low  amplitude fast activity of 20-25 Hz is seen diffusely in frontal areas.  No persistent focal slowing is noted.  No epileptiform discharges are  seen.  Towards the end of the recording, prior to photic stimulation,  there was some generalized slowing of rhythms into the theta range.  Stage II sleep is not seen, however.  Photic stimulation produced no  change in the background rhythms.  Hyperventilation was not performed.  Single-channel devoted EKG revealed sinus rhythm throughout with a rate  of approximately 60 beats per minute.   CONCLUSIONS:  Essentially normal study in the awake and drowsy states.  Diffusely low amplitudes are noted and this finding is likely of no  clinical significance.      Michael L. Thad Ranger, M.D.  Electronically Signed     EAV:[id number]  D:  10/11/2006 13:37:25  T:  10/11/2006 13:58:11  Job #:  [id number]

## 2011-02-26 NOTE — Procedures (Signed)
[place]  Patient:    [name], Steven [name] Visit Number: [id number] MRN: [id number]          Service Type: END Location: ENDO Attending Physician:  Charna Elizabeth Dictated by:   Anselmo Rod, M.D. Proc. Date: 07/07/01 Admit Date:  07/07/2001   CC:         Velna Hatchet, M.D.   Procedure Report  DATE OF BIRTH:  06/03/1956  REFERRING PHYSICIAN:  Velna Hatchet, M.D.  PROCEDURE PERFORMED:  Esophagogastroduodenoscopy.  ENDOSCOPIST:  Anselmo Rod, M.D.  INSTRUMENT USED:  Olympus video panendoscope.  INDICATIONS FOR PROCEDURE:  The patient is a 55 year old African-American male with severe epigastric pain, normal abdominal ultrasound with regard to gallbladder disease.  Guaiac positive stools, rule out peptic ulcer disease.  PREPROCEDURE PREPARATION:  Informed consent was procured from the patient. The patient was fasted for eight hours prior to the procedure.  PREPROCEDURE PHYSICAL:  The patient had stable vital signs.  Neck supple. Chest clear to auscultation.  S1, S2 regular.  Abdomen soft with epigastric tenderness on palpation, no guarding, no rebound, no rigidity, no hepatosplenomegaly.  DESCRIPTION OF PROCEDURE:  The patient was placed in left lateral decubitus position and sedated with 75 mg of Demerol and 7.5 mg of Versed intravenously. Once the patient was adequately sedated and maintained on low-flow oxygen and continuous cardiac monitoring, the Olympus video panendoscope was advanced through the mouthpiece, over the tongue, into the esophagus under direct vision.  The entire esophagus appeared normal without evidence of ring, stricture, masses, lesions, esophagitis, or Barretts mucosa.  The scope was then advanced to the stomach.  The entire gastric mucosa and proximal small bowel appeared normal.  There was no evidence of hiatal hernia.  IMPRESSION:  Normal esophagogastroduodenoscopy.  RECOMMENDATION: 1. Proceed  with CT scan of abdomen and pelvis today. 2. Colonoscopy early next week. 3. Continue proton pump inhibitors for now. Dictated by:   Anselmo Rod, M.D. Attending Physician:  Charna Elizabeth DD:  07/07/01 TD:  07/07/01 Job: [id number]

## 2011-02-26 NOTE — Consult Note (Signed)
NAME:  Steven [name], Steven [name]              ACCOUNT NO.:  [id number]   MEDICAL RECORD NO.:  [id number]          PATIENT TYPE:  INP   LOCATION:  3712                         FACILITY:  [facility]   PHYSICIAN:  Deanna Artis. Hickling, M.D.DATE OF BIRTH:  Oct 20, 1955   DATE OF CONSULTATION:  10/08/2006  DATE OF DISCHARGE:                                 CONSULTATION   CHIEF COMPLAINT:  Syncope.   HISTORY OF PRESENT CONDITION:  The patient is a 55 year old gentleman  with a history of 15 years of glaucoma, hypertension of two years in  duration, and dyslipidemia, who has had three episodes of syncope, two  last week and one on the day of his admission.  In all three cases, the  patient was upright and not engaging in vigorous activity.  He felt  nauseated but did not vomit.  He had significant diaphoresis, light  headedness, and substernal chest pain.  This happened over a matter of  seconds and then he lost consciousness.  He does not remember hitting  the ground. On two occasions, he was outside by himself and really does  not know how long he was out.  He recovered and was able to get back up  although he was quite sweaty.  The third occasion he was with his aunt  who caught him and prevented him from falling.  In no case are we aware  of that he had a significant head injury.  As a result of his  constellation of symptoms including chest pain and an elevated CK of  1005 with, otherwise, normal cardiac enzymes, the patient was asked to  be seen by cardiology and also neurology to evaluate his syncope.  The  patient has had no further episodes in the hospital and has had no  significant cardiac rhythm problems.  He has never had syncope or  seizures before.  He has not had a closed head injury.  He has taken his  medicines compliantly.  The patient apparently had some form of TIA like  symptoms in July 2004 but has had no further episodes similar to that.   PAST SURGICAL HISTORY:  Includes  appendectomy, total knee reconstruction  from an industrial accident.   MEDICATIONS ON ADMISSION:  Ciprofloxacin 500 mg twice daily that was  started December 23 for recurrent urinary tract infection, Crestor 10 mg  daily, lisinopril 10 mg daily, and eye drops including Cosopt and  Lotemax twice daily.   DRUG ALLERGIES:  None known.  He has intolerance to ASPIRIN.   The patient has had loss of vision as a result of his glaucoma.   REVIEW OF SYSTEMS:  12 system review is recorded on the chart and  includes symptoms of carpal tunnel syndrome for which he had carpal  tunnel release in April 2007.  The appendectomy took place in 1979 and  the knee surgery in 1996.  12 system review is, otherwise, negative.   SOCIAL HISTORY:  The patient lives with family.  He does not use  alcohol.  He quit smoking five years ago.  He does not use drugs.  PHYSICAL EXAMINATION:  GENERAL:  This is a pleasant gentleman in no  acute distress lying in bed.  VITAL SIGNS:  Temperature 97.5, blood pressure 114/67, resting pulse 66,  respirations 16, oxygen saturation 100%, weight 166 1/2 pounds.  NECK:  Supple.  ENT:  No signs of infection.  No tenderness.  LUNGS:  Clear.  HEART:  No murmurs.  Pulses normal.  ABDOMEN:  Soft.  Bowel sounds normal.  No hepatosplenomegaly.  EXTREMITIES:  Unremarkable.  /NEUROLOGIC EXAMINATION:  The patient is awake, alert, no dysphasia or  dyspraxia.  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  Visual  fields are full to double simultaneous stimuli.  He counts fingers at 3  feet.  Extraocular movements are full.  Symmetric facial strength.  Midline tongue and uvula.  Air conduction greater than bone conduction  bilaterally.  Motor examination reveals normal strength, tone and mass.  Good fine motor movements.  No drift.  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  Cerebellar  examination reveals good finger-to-nose, rapid alternating movements.  Gait was normal.  Deep tendon reflexes were absent.  The patient had  bilateral flexor plantar responses.  I checked orthostatic blood  pressures, in the recumbent position 124/80, resting pulse 84, sitting  136/84, resting pulse 96, standing 130/90, resting pulse 84.   IMPRESSION:  1. Syncope primary cardiac (chest pain is unusual) versus neurally      mediated syncope.  2. Seizures are an unlikely etiology.   PLAN:  I agree with the workup as outlined by Dr. Waymon Amato.  In  addition, we will order an EEG which cannot be done until Monday,  December 31.  This could be done as an outpatient if it was appropriate  to discharge him home.  If workup is negative and syncope continues, a  tilt table test may be necessary.  I appreciate the opportunity to  participate in his care.  I will review of his laboratory tests as well  as his CT scan and EEG.      Deanna Artis. Sharene Skeans, M.D.  Electronically Signed     WHH/MEDQ  D:  10/08/2006  T:  10/08/2006  Job:  [id number]   cc:   Marcellus Scott, MD

## 2011-07-19 LAB — POCT I-STAT 4, (NA,K, GLUC, HGB,HCT): Operator id: 114531

## 2011-07-28 DIAGNOSIS — T85398A Other mechanical complication of other ocular prosthetic devices, implants and grafts, initial encounter: Secondary | ICD-10-CM | POA: Insufficient documentation

## 2011-07-28 HISTORY — DX: Other mechanical complication of other ocular prosthetic devices, implants and grafts, initial encounter: T85.398A

## 2011-09-14 DIAGNOSIS — H35359 Cystoid macular degeneration, unspecified eye: Secondary | ICD-10-CM | POA: Insufficient documentation

## 2011-09-14 DIAGNOSIS — H44113 Panuveitis, bilateral: Secondary | ICD-10-CM | POA: Insufficient documentation

## 2011-09-14 DIAGNOSIS — D8689 Sarcoidosis of other sites: Secondary | ICD-10-CM | POA: Insufficient documentation

## 2011-10-20 DIAGNOSIS — E1142 Type 2 diabetes mellitus with diabetic polyneuropathy: Secondary | ICD-10-CM | POA: Diagnosis not present

## 2011-10-20 DIAGNOSIS — E1129 Type 2 diabetes mellitus with other diabetic kidney complication: Secondary | ICD-10-CM | POA: Diagnosis not present

## 2011-10-20 DIAGNOSIS — N182 Chronic kidney disease, stage 2 (mild): Secondary | ICD-10-CM | POA: Diagnosis not present

## 2011-10-20 DIAGNOSIS — I129 Hypertensive chronic kidney disease with stage 1 through stage 4 chronic kidney disease, or unspecified chronic kidney disease: Secondary | ICD-10-CM | POA: Diagnosis not present

## 2011-10-20 DIAGNOSIS — E1149 Type 2 diabetes mellitus with other diabetic neurological complication: Secondary | ICD-10-CM | POA: Diagnosis not present

## 2011-11-12 DIAGNOSIS — H44119 Panuveitis, unspecified eye: Secondary | ICD-10-CM | POA: Diagnosis not present

## 2011-11-12 DIAGNOSIS — H35359 Cystoid macular degeneration, unspecified eye: Secondary | ICD-10-CM | POA: Diagnosis not present

## 2011-11-12 DIAGNOSIS — E119 Type 2 diabetes mellitus without complications: Secondary | ICD-10-CM | POA: Diagnosis not present

## 2011-12-01 DIAGNOSIS — T85398A Other mechanical complication of other ocular prosthetic devices, implants and grafts, initial encounter: Secondary | ICD-10-CM | POA: Diagnosis not present

## 2011-12-01 DIAGNOSIS — H44119 Panuveitis, unspecified eye: Secondary | ICD-10-CM | POA: Diagnosis not present

## 2011-12-07 DIAGNOSIS — N401 Enlarged prostate with lower urinary tract symptoms: Secondary | ICD-10-CM | POA: Diagnosis not present

## 2011-12-07 DIAGNOSIS — M549 Dorsalgia, unspecified: Secondary | ICD-10-CM | POA: Diagnosis not present

## 2011-12-18 ENCOUNTER — Emergency Department (HOSPITAL_COMMUNITY): Payer: Medicare Other

## 2011-12-18 ENCOUNTER — Inpatient Hospital Stay (HOSPITAL_COMMUNITY)
Admission: EM | Admit: 2011-12-18 | Discharge: 2011-12-21 | DRG: 313 | Disposition: A | Discharge status: Home / Self Care | Payer: Medicare Other | Attending: Cardiology | Admitting: Cardiology

## 2011-12-18 ENCOUNTER — Other Ambulatory Visit: Payer: Self-pay

## 2011-12-18 ENCOUNTER — Inpatient Hospital Stay (HOSPITAL_COMMUNITY): Payer: Medicare Other

## 2011-12-18 ENCOUNTER — Encounter (HOSPITAL_COMMUNITY): Payer: Self-pay | Admitting: Nurse Practitioner

## 2011-12-18 DIAGNOSIS — H548 Legal blindness, as defined in USA: Secondary | ICD-10-CM

## 2011-12-18 DIAGNOSIS — R0789 Other chest pain: Secondary | ICD-10-CM | POA: Diagnosis not present

## 2011-12-18 DIAGNOSIS — IMO0002 Reserved for concepts with insufficient information to code with codable children: Secondary | ICD-10-CM | POA: Diagnosis not present

## 2011-12-18 DIAGNOSIS — I10 Essential (primary) hypertension: Secondary | ICD-10-CM | POA: Diagnosis not present

## 2011-12-18 DIAGNOSIS — Z951 Presence of aortocoronary bypass graft: Secondary | ICD-10-CM | POA: Diagnosis not present

## 2011-12-18 DIAGNOSIS — R209 Unspecified disturbances of skin sensation: Secondary | ICD-10-CM | POA: Diagnosis not present

## 2011-12-18 DIAGNOSIS — K5904 Chronic idiopathic constipation: Secondary | ICD-10-CM | POA: Diagnosis present

## 2011-12-18 DIAGNOSIS — Z7982 Long term (current) use of aspirin: Secondary | ICD-10-CM | POA: Diagnosis not present

## 2011-12-18 DIAGNOSIS — Z947 Corneal transplant status: Secondary | ICD-10-CM

## 2011-12-18 DIAGNOSIS — E785 Hyperlipidemia, unspecified: Secondary | ICD-10-CM | POA: Diagnosis present

## 2011-12-18 DIAGNOSIS — Z8249 Family history of ischemic heart disease and other diseases of the circulatory system: Secondary | ICD-10-CM | POA: Diagnosis not present

## 2011-12-18 DIAGNOSIS — H5440 Blindness, one eye, unspecified eye: Secondary | ICD-10-CM | POA: Diagnosis not present

## 2011-12-18 DIAGNOSIS — K5909 Other constipation: Secondary | ICD-10-CM | POA: Diagnosis present

## 2011-12-18 DIAGNOSIS — Z48298 Encounter for aftercare following other organ transplant: Secondary | ICD-10-CM | POA: Diagnosis not present

## 2011-12-18 DIAGNOSIS — I2510 Atherosclerotic heart disease of native coronary artery without angina pectoris: Secondary | ICD-10-CM | POA: Diagnosis not present

## 2011-12-18 DIAGNOSIS — R0602 Shortness of breath: Secondary | ICD-10-CM | POA: Diagnosis not present

## 2011-12-18 DIAGNOSIS — R079 Chest pain, unspecified: Secondary | ICD-10-CM | POA: Diagnosis present

## 2011-12-18 DIAGNOSIS — Z9861 Coronary angioplasty status: Secondary | ICD-10-CM

## 2011-12-18 DIAGNOSIS — K5900 Constipation, unspecified: Secondary | ICD-10-CM | POA: Diagnosis present

## 2011-12-18 DIAGNOSIS — Z79899 Other long term (current) drug therapy: Secondary | ICD-10-CM

## 2011-12-18 DIAGNOSIS — E119 Type 2 diabetes mellitus without complications: Secondary | ICD-10-CM | POA: Diagnosis present

## 2011-12-18 DIAGNOSIS — H409 Unspecified glaucoma: Secondary | ICD-10-CM | POA: Insufficient documentation

## 2011-12-18 HISTORY — DX: Corneal transplant status: Z94.7

## 2011-12-18 HISTORY — DX: Atherosclerotic heart disease of native coronary artery without angina pectoris: I25.10

## 2011-12-18 HISTORY — DX: Essential (primary) hypertension: I10

## 2011-12-18 HISTORY — DX: Chest pain, unspecified: R07.9

## 2011-12-18 HISTORY — DX: Type 2 diabetes mellitus without complications: E11.9

## 2011-12-18 HISTORY — DX: Hyperlipidemia, unspecified: E78.5

## 2011-12-18 HISTORY — DX: Legal blindness, as defined in USA: H54.8

## 2011-12-18 LAB — COMPREHENSIVE METABOLIC PANEL
ALT: 17 U/L (ref 0–53)
AST: 20 U/L (ref 0–37)
CO2: 28 mEq/L (ref 19–32)
Chloride: 102 mEq/L (ref 96–112)
Creatinine, Ser: 1.25 mg/dL (ref 0.50–1.35)
GFR calc Af Amer: 74 mL/min — ABNORMAL LOW (ref >90)
GFR calc non Af Amer: 64 mL/min — ABNORMAL LOW (ref >90)
Glucose, Bld: 103 mg/dL — ABNORMAL HIGH (ref 70–99)
Sodium: 137 mEq/L (ref 135–145)
Total Bilirubin: 1.1 mg/dL (ref 0.3–1.2)

## 2011-12-18 LAB — DIFFERENTIAL
Basophils Absolute: 0 10*3/uL (ref 0.0–0.1)
Eosinophils Relative: 0 % (ref 0–5)
Lymphocytes Relative: 37 % (ref 12–46)
Lymphs Abs: 1.7 10*3/uL (ref 0.7–4.0)
Monocytes Absolute: 0.3 10*3/uL (ref 0.1–1.0)
Neutro Abs: 2.6 10*3/uL (ref 1.7–7.7)

## 2011-12-18 LAB — CBC
HCT: 45.5 % (ref 39.0–52.0)
MCV: 91.7 fL (ref 78.0–100.0)
RBC: 4.96 MIL/uL (ref 4.22–5.81)
RDW: 13.9 % (ref 11.5–15.5)
WBC: 4.6 10*3/uL (ref 4.0–10.5)

## 2011-12-18 LAB — MAGNESIUM: Magnesium: 1.8 mg/dL (ref 1.5–2.5)

## 2011-12-18 LAB — CARDIAC PANEL(CRET KIN+CKTOT+MB+TROPI)
CK, MB: 1.2 ng/mL (ref 0.3–4.0)
Total CK: 226 U/L (ref 7–232)
Troponin I: <0.3 ng/mL (ref <0.30)

## 2011-12-18 LAB — GLUCOSE, CAPILLARY: Glucose-Capillary: 76 mg/dL (ref 70–99)

## 2011-12-18 MED ORDER — ASPIRIN EC 81 MG PO TBEC
81.0000 mg | DELAYED_RELEASE_TABLET | Freq: Every day | ORAL | Status: DC
  Administered 2011-12-19 – 2011-12-21 (×3): 81 mg via ORAL
  Filled 2011-12-18 (×4): qty 1

## 2011-12-18 MED ORDER — IOHEXOL 300 MG/ML  SOLN
80.0000 mL | Freq: Once | INTRAMUSCULAR | Status: AC | PRN
  Administered 2011-12-18: 80 mL via INTRAVENOUS

## 2011-12-18 MED ORDER — SIMVASTATIN 10 MG PO TABS
10.0000 mg | ORAL_TABLET | Freq: Every day | ORAL | Status: DC
  Filled 2011-12-18: qty 1

## 2011-12-18 MED ORDER — METHOTREXATE 2.5 MG PO TABS: 12.5000 mg | ORAL_TABLET | ORAL | Status: DC

## 2011-12-18 MED ORDER — ASPIRIN 300 MG RE SUPP
300.0000 mg | RECTAL | Status: DC
  Filled 2011-12-18: qty 1

## 2011-12-18 MED ORDER — ASPIRIN 81 MG PO CHEW
324.0000 mg | CHEWABLE_TABLET | Freq: Once | ORAL | Status: AC
  Administered 2011-12-18: 324 mg via ORAL
  Filled 2011-12-18: qty 4

## 2011-12-18 MED ORDER — ENOXAPARIN SODIUM 80 MG/0.8ML ~~LOC~~ SOLN
75.0000 mg | Freq: Two times a day (BID) | SUBCUTANEOUS | Status: DC
  Administered 2011-12-18 – 2011-12-19 (×2): 75 mg via SUBCUTANEOUS
  Filled 2011-12-18 (×4): qty 0.8

## 2011-12-18 MED ORDER — ONDANSETRON HCL 4 MG/2ML IJ SOLN: 4.0000 mg | Freq: Four times a day (QID) | INTRAMUSCULAR | Status: DC | PRN

## 2011-12-18 MED ORDER — SODIUM CHLORIDE 0.9 % IV SOLN
INTRAVENOUS | Status: DC
  Administered 2011-12-18 – 2011-12-19 (×2): via INTRAVENOUS

## 2011-12-18 MED ORDER — ISOSORBIDE MONONITRATE ER 30 MG PO TB24
30.0000 mg | ORAL_TABLET | Freq: Every day | ORAL | Status: DC
  Administered 2011-12-18 – 2011-12-21 (×4): 30 mg via ORAL
  Filled 2011-12-18 (×4): qty 1

## 2011-12-18 MED ORDER — CYCLOSPORINE 25 MG PO CAPS
75.0000 mg | ORAL_CAPSULE | Freq: Two times a day (BID) | ORAL | Status: DC
Start: 2011-12-18 — End: 2011-12-21
  Administered 2011-12-18 – 2011-12-21 (×6): 75 mg via ORAL
  Filled 2011-12-18 (×7): qty 3

## 2011-12-18 MED ORDER — PREDNISOLONE ACETATE 1 % OP SUSP
1.0000 [drp] | Freq: Every day | OPHTHALMIC | Status: DC
  Administered 2011-12-18 – 2011-12-21 (×4): 1 [drp] via OPHTHALMIC
  Filled 2011-12-18: qty 1

## 2011-12-18 MED ORDER — NITROGLYCERIN 2 % TD OINT
1.0000 [in_us] | TOPICAL_OINTMENT | Freq: Once | TRANSDERMAL | Status: AC
  Administered 2011-12-18: 1 [in_us] via TOPICAL
  Filled 2011-12-18: qty 1

## 2011-12-18 MED ORDER — METOPROLOL TARTRATE 12.5 MG HALF TABLET
12.5000 mg | ORAL_TABLET | Freq: Two times a day (BID) | ORAL | Status: DC
  Administered 2011-12-18 – 2011-12-21 (×6): 12.5 mg via ORAL
  Filled 2011-12-18 (×7): qty 1

## 2011-12-18 MED ORDER — ASPIRIN 81 MG PO CHEW: 324.0000 mg | CHEWABLE_TABLET | ORAL | Status: DC

## 2011-12-18 MED ORDER — LISINOPRIL 10 MG PO TABS
10.0000 mg | ORAL_TABLET | Freq: Every day | ORAL | Status: DC
Start: 2011-12-18 — End: 2011-12-21
  Administered 2011-12-18 – 2011-12-21 (×4): 10 mg via ORAL
  Filled 2011-12-18 (×4): qty 1

## 2011-12-18 MED ORDER — ACETAMINOPHEN 325 MG PO TABS
650.0000 mg | ORAL_TABLET | ORAL | Status: DC | PRN
  Administered 2011-12-18 – 2011-12-20 (×3): 650 mg via ORAL
  Filled 2011-12-18 (×3): qty 2

## 2011-12-18 MED ORDER — INSULIN ASPART 100 UNIT/ML ~~LOC~~ SOLN
0.0000 [IU] | Freq: Three times a day (TID) | SUBCUTANEOUS | Status: DC
  Filled 2011-12-18: qty 3

## 2011-12-18 MED ORDER — NITROGLYCERIN 2 % TD OINT: 1.0000 [in_us] | TOPICAL_OINTMENT | Freq: Four times a day (QID) | TRANSDERMAL | Status: DC

## 2011-12-18 MED ORDER — FOLIC ACID 1 MG PO TABS
1.0000 mg | ORAL_TABLET | Freq: Every day | ORAL | Status: DC
  Administered 2011-12-18 – 2011-12-21 (×4): 1 mg via ORAL
  Filled 2011-12-18 (×4): qty 1

## 2011-12-18 MED ORDER — NITROGLYCERIN 0.4 MG SL SUBL: 0.4000 mg | SUBLINGUAL_TABLET | SUBLINGUAL | Status: DC | PRN

## 2011-12-18 NOTE — ED Provider Notes (Signed)
History     CSN: [id number]  Arrival date & time 12/18/11  1243   First MD Initiated Contact with Patient 12/18/11 1322      Chief Complaint  Patient presents with  . Chest Pain    (Consider location/radiation/quality/duration/timing/severity/associated sxs/prior treatment) HPI  Patient relates 3 or 4 days ago he started having intermittent chest pain that lasted about 15 minutes. He states there's nothing he does makes the pain come on. He states nothing he does makes it feel better once he gets it. He states the pain is in his left chest and is described as aching. He states he also has some shortness of breath that has become more constant over last couple days. He states he has a dry cough with rhinorrhea but denies sore throat or fever. He states about 2 nights ago he woke up in a sweat. He denies nausea, vomiting, or diarrhea. He states his last BM was 4 days ago. He states he took a liquid laxative without results. He also states both his feet and hands feel non-and stinging the past 3 days. He states he thinks this feels like when he had his cardiac problems in the past. Patient states he had a cardiac stent placed in 2007 by Dr. Elsie Lincoln with [facility]. He has another appointment there on March 22.  PCP Dr Dorothyann Peng Cardiologist [facility]  Past Medical History  Diagnosis Date  . Diabetes mellitus   . Hypertension   Glaucoma  Past Surgical History  Procedure Date  . Coronary stent placement   . 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 No pertinent family history. Glaucoma CAD Diabetes HTN  History  Substance Use Topics  . Smoking status: Former Games developer  . Smokeless tobacco: Not on file  . Alcohol Use: No  on disability    Review of Systems  All other systems reviewed and are negative.    Allergies  Review of patient's allergies indicates no known  allergies.  Home Medications   Current Outpatient Rx  Name Route Sig Dispense Refill  . ASPIRIN EC 81 MG PO TBEC Oral Take 81 mg by mouth daily.    . CYCLOSPORINE 25 MG PO CAPS Oral Take 75 mg by mouth 2 (two) times daily.    Marland Kitchen FOLIC ACID 1 MG PO TABS Oral Take 1 mg by mouth daily.    Marland Kitchen LISINOPRIL 10 MG PO TABS Oral Take 10 mg by mouth daily.    Marland Kitchen METHOTREXATE 2.5 MG PO TABS Oral Take 12.5 mg by mouth once a week. Fridays. Caution:Chemotherapy. Protect from light.    Marland Kitchen PRAVASTATIN SODIUM 40 MG PO TABS Oral Take 40 mg by mouth daily.    Marland Kitchen PREDNISOLONE ACETATE 1 % OP SUSP Left Eye Place 1 drop into the left eye daily.    Januvia Pravastatin  BP 151/88  Pulse 62  Temp(Src) 98.2 F (36.8 C) (Oral)  Resp 20  Ht 5\' 8"  (1.727 m)  Wt 169 lb (76.658 kg)  BMI 25.70 kg/m2  SpO2 100%  Vital signs normal    Physical Exam  Nursing note and vitals reviewed. Constitutional: He is oriented to person, place, and time. He appears well-developed and well-nourished.  Non-toxic appearance. He does not appear ill. No distress.  HENT:  Head: Normocephalic and atraumatic.  Right Ear: External ear normal.  Left Ear: External ear normal.  Nose: Nose  normal. No mucosal edema or rhinorrhea.  Mouth/Throat: Oropharynx is clear and moist and mucous membranes are normal. No dental abscesses or uvula swelling.  Eyes: EOM are normal.       Patient has mild injection of his left eye. His left cornea is slightly hazy. Patient states he can see out of his left eye. His right eye has nonreactive pupil and he states he cannot see out of his right eye.  Neck: Normal range of motion and full passive range of motion without pain. Neck supple.  Cardiovascular: Normal rate, regular rhythm and normal heart sounds.  Exam reveals no gallop and no friction rub.   No murmur heard. Pulmonary/Chest: Effort normal and breath sounds normal. No respiratory distress. He has no wheezes. He has no rhonchi. He has no rales. He  exhibits no tenderness and no crepitus.  Abdominal: Soft. Normal appearance and bowel sounds are normal. He exhibits no distension. There is no tenderness. There is no rebound and no guarding.  Musculoskeletal: Normal range of motion. He exhibits no edema and no tenderness.       Moves all extremities well.   Neurological: He is alert and oriented to person, place, and time. He has normal strength. No cranial nerve deficit.  Skin: Skin is warm, dry and intact. No rash noted. No erythema. No pallor.  Psychiatric: He has a normal mood and affect. His speech is normal and behavior is normal. His mood appears not anxious.    ED Course  Procedures (including critical care time)  Pt states his last episode of chest pain was at 11 pm.   15:30 Dr Dorethea Clan will come see patient for [facility].  Results for orders placed during the hospital encounter of 12/18/11  CBC      Component Value Range   WBC 4.6  4.0 - 10.5 (K/uL)   RBC 4.96  4.22 - 5.81 (MIL/uL)   Hemoglobin 16.0  13.0 - 17.0 (g/dL)   HCT 16.1  09.6 - 04.5 (%)   MCV 91.7  78.0 - 100.0 (fL)   MCH 32.3  26.0 - 34.0 (pg)   MCHC 35.2  30.0 - 36.0 (g/dL)   RDW 40.9  81.1 - 91.4 (%)   Platelets 180  150 - 400 (K/uL)  DIFFERENTIAL      Component Value Range   Neutrophils Relative 56  43 - 77 (%)   Neutro Abs 2.6  1.7 - 7.7 (K/uL)   Lymphocytes Relative 37  12 - 46 (%)   Lymphs Abs 1.7  0.7 - 4.0 (K/uL)   Monocytes Relative 7  3 - 12 (%)   Monocytes Absolute 0.3  0.1 - 1.0 (K/uL)   Eosinophils Relative 0  0 - 5 (%)   Eosinophils Absolute 0.0  0.0 - 0.7 (K/uL)   Basophils Relative 0  0 - 1 (%)   Basophils Absolute 0.0  0.0 - 0.1 (K/uL)  COMPREHENSIVE METABOLIC PANEL      Component Value Range   Sodium 137  135 - 145 (mEq/L)   Potassium 4.8  3.5 - 5.1 (mEq/L)   Chloride 102  96 - 112 (mEq/L)   CO2 28  19 - 32 (mEq/L)   Glucose, Bld 103 (*) 70 - 99 (mg/dL)   BUN 13  6 - 23 (mg/dL)   Creatinine, Ser 7.82  0.50 - 1.35 (mg/dL)   Calcium  95.6  8.4 - 10.5 (mg/dL)   Total Protein 7.9  6.0 - 8.3 (g/dL)   Albumin 4.3  3.5 - 5.2 (g/dL)   AST 20  0 - 37 (U/L)   ALT 17  0 - 53 (U/L)   Alkaline Phosphatase 50  39 - 117 (U/L)   Total Bilirubin 1.1  0.3 - 1.2 (mg/dL)   GFR calc non Af Amer 64 (*) >90 (mL/min)   GFR calc Af Amer 74 (*) >90 (mL/min)  TROPONIN I      Component Value Range   Troponin I <0.30  <0.30 (ng/mL)  APTT      Component Value Range   aPTT 29  24 - 37 (seconds)  PROTIME-INR      Component Value Range   Prothrombin Time 13.5  11.6 - 15.2 (seconds)   INR 1.01  0.00 - 1.49    Laboratory interpretation all normal   Dg Chest Port 1 View  12/18/2011  *RADIOLOGY REPORT*  Clinical Data: Chest pain for the past 4 days.  Shortness of breath.  PORTABLE CHEST - 1 VIEW  Comparison: Chest x-ray 09/12/2009.  Findings: The Lung volumes are normal.  No consolidative airspace disease.  No pleural effusions.  No pneumothorax.  No pulmonary nodule or mass noted.  Pulmonary vasculature and the cardiomediastinal silhouette are within normal limits.  IMPRESSION: 1. No radiographic evidence of acute cardiopulmonary disease.  Original Report Authenticated By: Florencia Reasons, M.D.     Date: 12/18/2011  Rate: 63  Rhythm: normal sinus rhythm  QRS Axis: right  Intervals: normal  ST/T Wave abnormalities: early repolarization  Conduction Disutrbances:none  Narrative Interpretation:   Old EKG Reviewed: unchanged from 07/27/2009    1. Chest pain    Plan admission   Devoria Albe, MD, FACEP    MDM          Ward Givens, MD 12/18/11 [phone]

## 2011-12-18 NOTE — ED Notes (Signed)
Patient remains on monitor and 2L oxygen with sats of 100% and NAD at this time.

## 2011-12-18 NOTE — ED Notes (Signed)
C/o chest pain intermittent x 4 days. Worse today. Today his arms and legs "feel numb and burning." A&Ox4, resp e/u

## 2011-12-18 NOTE — ED Notes (Signed)
Patient remains on monitor and 2L oxygen with sats of 100%. Patient denies chest pain and states his legs hurt. Patient states leg pain is new onset as of today.

## 2011-12-18 NOTE — Progress Notes (Signed)
ANTICOAGULATION CONSULT NOTE - Initial Consult  Pharmacy Consult for Lovenox Indication: chest pain/ACS  No Known Allergies  Patient Measurements: Height: 5\' 8"  (172.7 cm) Weight: 163 lb 12.8 oz (74.299 kg) IBW/kg (Calculated) : 68.4  Heparin Dosing Weight: 74.3 kg  Vital Signs: Temp: 98.7 F (37.1 C) (03/09 1904) Temp src: Oral (03/09 1904) BP: 115/65 mmHg (03/09 1904) Pulse Rate: 64  (03/09 1904)  Labs:  Basename 12/18/11 1402 12/18/11 1400  HGB -- 16.0  HCT -- 45.5  PLT -- 180  APTT -- 29  LABPROT -- 13.5  INR -- 1.01  HEPARINUNFRC -- --  CREATININE -- 1.25  CKTOTAL -- --  CKMB -- --  TROPONINI <0.30 --   Estimated Creatinine Clearance: 65.4 ml/min (by C-G formula based on Cr of 1.25).  Medical History: Past Medical History  Diagnosis Date  . Diabetes mellitus   . Hypertension   . Chest pain at rest 12/18/2011  . CAD (coronary artery disease), known LAD stent placed in 2007 in [place] ,last cath 2010 with patent stent and nonobstructive CAD 12/18/2011  . 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 History of corneal transplant, lt. eye with blurred vision 12/18/2011  . DM (diabetes mellitus) 12/18/2011  . HTN (hypertension) 12/18/2011  . Dyslipidemia 12/18/2011  . Blindness, legal 12/18/2011    Medications:  Prescriptions prior to admission  Medication Sig Dispense Refill  . aspirin EC 81 MG tablet Take 81 mg by mouth daily.      . cycloSPORINE (SANDIMMUNE) 25 MG capsule Take 75 mg by mouth 2 (two) times daily.      . folic acid (FOLVITE) 1 MG tablet Take 1 mg by mouth daily.      Marland Kitchen lisinopril (PRINIVIL,ZESTRIL) 10 MG tablet Take 10 mg by mouth daily.      . methotrexate (RHEUMATREX) 2.5 MG tablet Take 12.5 mg by mouth once a week. Fridays. Caution:Chemotherapy. Protect from light.      . pravastatin (PRAVACHOL) 40 MG tablet Take 40 mg by mouth daily.      . prednisoLONE acetate (PRED FORTE) 1 % ophthalmic suspension Place 1 drop into the left eye daily.       Scheduled:      . aspirin  324 mg Oral Once  . aspirin  324 mg Oral NOW   Or  . aspirin  300 mg Rectal NOW  . aspirin EC  81 mg Oral Daily  . cycloSPORINE  75 mg Oral BID  . folic acid  1 mg Oral Daily  . insulin aspart  0-9 Units Subcutaneous TID WC  . lisinopril  10 mg Oral Daily  . methotrexate  12.5 mg Oral Weekly  . metoprolol tartrate  12.5 mg Oral BID  . nitroGLYCERIN  1 inch Topical Once  . nitroGLYCERIN  1 inch Topical Q6H  . prednisoLONE acetate  1 drop Left Eye Daily  . simvastatin  10 mg Oral q1800    Assessment: 56 y.o. M with known history of CAD and prior stent to the LAD who presented  to [facility] with CP and is now to start Lovenox for anticoagulation while ruling out NSTEMI. Troponins x 1 are negative thus far. Hgb/Hct/Plt ok. Wt: 74.3 kg, SCr 1.25, CrCl~60-70 ml/min.   Goal of Therapy:  Anti-Xa level of 0.6-1.2 units/ml   Plan:  1. Lovenox 75 mg SQ every 12 hours 2. Will continue to monitor for any signs/symptoms of bleeding or  dose adjustments due to renal function 3. Will only  plan to check anti-Xa level if clinically indicated.  Georgina Pillion, PharmD, BCPS Clinical Pharmacist Pager: [phone] 12/18/2011 7:43 PM

## 2011-12-18 NOTE — H&P (Signed)
Steven [name] is an 56 y.o. male.     Chief Complaint: chest pain HPI: 56 year old with a history of coronary artery disease with a stent to the LAD in 2007 in [place].  He had his last heart catheterization 2010 after presenting with chest pain was 10 out of 10 in intensity he underwent cardiac catheterization and was found to have patent LAD stent there we did have in stent restenosis of about 30%, 20-30% proximal stenosis of the left circumflex and right coronary artery that is codominant with 25-30% proximal mid stenosis LV function was 65%.  Patient states 3-5 days ago he started having chest pain across his chest that down both legs the numbness and tingling and burning also into his stomach he is mildly short of breath with this no nausea only once did he awakened from sleep with the discomfort and diaphoresis. He has not had to change his activity at all with the pain as exertion does not increase the pain.    Currently here in the emergency room he has nitro paste on and is currently pain-free.   Past Medical History  Diagnosis Date  . Diabetes mellitus   . Hypertension   . Chest pain at rest 12/18/2011  . CAD (coronary artery disease), known LAD stent placed in 2007 in [place] ,last cath 2010 with patent stent and nonobstructive CAD 12/18/2011  . 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 History of corneal transplant, lt. eye with blurred vision 12/18/2011  . DM (diabetes mellitus) 12/18/2011  . HTN (hypertension) 12/18/2011  . Dyslipidemia 12/18/2011  . Blindness, legal 12/18/2011    Past Surgical History  Procedure Date  . Coronary stent placement   . Cardiac surgery     stent placed in 2007  . Coronary angioplasty   . Corneal transplant     Family History  Problem Relation Age of Onset  . Heart attack Mother    Social History:  reports that he has quit smoking. He does not have any smokeless tobacco history on file. He reports that he uses illicit drugs (Marijuana). He  reports that he does not drink alcohol.  Allergies: No Known Allergies  Medications Prior to Admission  Medication Dose Route Frequency Provider Last Rate Last Dose  . aspirin chewable tablet 324 mg  324 mg Oral Once Ward Givens, MD   324 mg at 12/18/11 1436  . nitroGLYCERIN (NITROGLYN) 2 % ointment 1 inch  1 inch Topical Once Ward Givens, MD   1 inch at 12/18/11 1439   No current outpatient prescriptions on file as of 12/18/2011.   Outpatient medications:   Aspirin 81 mg daily Cyclosporin 25 mg take a total of 75 mg twice a day Folic acid 1 mg daily Fosinopril 10 mg daily Methotrexate 2.5 mg tablet to take 12.5 mg by mouth once a week on Fridays Prevacid 40 mg daily Pred forte eyedrops 1% suspension place one drop in the left eye daily  Results for orders placed during the hospital encounter of 12/18/11 (from the past 48 hour(s))  CBC     Status: Normal   Collection Time   12/18/11  2:00 PM      Component Value Range Comment   WBC 4.6  4.0 - 10.5 (K/uL)    RBC 4.96  4.22 - 5.81 (MIL/uL)    Hemoglobin 16.0  13.0 - 17.0 (g/dL)    HCT 16.1  09.6 - 04.5 (%)    MCV 91.7  78.0 - 100.0 (fL)    MCH 32.3  26.0 - 34.0 (pg)    MCHC 35.2  30.0 - 36.0 (g/dL)    RDW 16.1  09.6 - 04.5 (%)    Platelets 180  150 - 400 (K/uL)   DIFFERENTIAL     Status: Normal   Collection Time   12/18/11  2:00 PM      Component Value Range Comment   Neutrophils Relative 56  43 - 77 (%)    Neutro Abs 2.6  1.7 - 7.7 (K/uL)    Lymphocytes Relative 37  12 - 46 (%)    Lymphs Abs 1.7  0.7 - 4.0 (K/uL)    Monocytes Relative 7  3 - 12 (%)    Monocytes Absolute 0.3  0.1 - 1.0 (K/uL)    Eosinophils Relative 0  0 - 5 (%)    Eosinophils Absolute 0.0  0.0 - 0.7 (K/uL)    Basophils Relative 0  0 - 1 (%)    Basophils Absolute 0.0  0.0 - 0.1 (K/uL)   COMPREHENSIVE METABOLIC PANEL     Status: Abnormal   Collection Time   12/18/11  2:00 PM      Component Value Range Comment   Sodium 137  135 - 145 (mEq/L)    Potassium 4.8   3.5 - 5.1 (mEq/L)    Chloride 102  96 - 112 (mEq/L)    CO2 28  19 - 32 (mEq/L)    Glucose, Bld 103 (*) 70 - 99 (mg/dL)    BUN 13  6 - 23 (mg/dL)    Creatinine, Ser 4.09  0.50 - 1.35 (mg/dL)    Calcium 81.1  8.4 - 10.5 (mg/dL)    Total Protein 7.9  6.0 - 8.3 (g/dL)    Albumin 4.3  3.5 - 5.2 (g/dL)    AST 20  0 - 37 (U/L)    ALT 17  0 - 53 (U/L)    Alkaline Phosphatase 50  39 - 117 (U/L)    Total Bilirubin 1.1  0.3 - 1.2 (mg/dL)    GFR calc non Af Amer 64 (*) >90 (mL/min)    GFR calc Af Amer 74 (*) >90 (mL/min)   APTT     Status: Normal   Collection Time   12/18/11  2:00 PM      Component Value Range Comment   aPTT 29  24 - 37 (seconds)   PROTIME-INR     Status: Normal   Collection Time   12/18/11  2:00 PM      Component Value Range Comment   Prothrombin Time 13.5  11.6 - 15.2 (seconds)    INR 1.01  0.00 - 1.49    TROPONIN I     Status: Normal   Collection Time   12/18/11  2:02 PM      Component Value Range Comment   Troponin I <0.30  <0.30 (ng/mL)    Dg Chest Port 1 View  12/18/2011  *RADIOLOGY REPORT*  Clinical Data: Chest pain for the past 4 days.  Shortness of breath.  PORTABLE CHEST - 1 VIEW  Comparison: Chest x-ray 09/12/2009.  Findings: The Lung volumes are normal.  No consolidative airspace disease.  No pleural effusions.  No pneumothorax.  No pulmonary nodule or mass noted.  Pulmonary vasculature and the cardiomediastinal silhouette are within normal limits.  IMPRESSION: 1. No radiographic evidence of acute cardiopulmonary disease.  Original Report Authenticated By: Florencia Reasons, M.D.   ROS: General:no colds  or fevers Skin: no rashes or alters HEENT:blind in the right eye, left eye is blurred vision after corneal transplant RU:EAVWU pain is described PUL:no shortness of breath, stopped smoking 17 years ago GI:no diarrhea constipation melena JW:JXBJYNWGN or dysuria MS:no joint pain Neuro:no syncope Endo:positive for diabetes does not know how his glucose runs no  thyroid disease  Blood pressure 151/88, pulse 62, temperature 98.2 F (36.8 C), temperature source Oral, resp. rate 20, height 5\' 8"  (1.727 m), weight 76.658 kg (169 lb), SpO2 100.00%. PE: General: alert oriented African American male no acute distress, pleasant affect Skin:warm and dry brisk capillary refill HEENT: normocephalic, right eye blindness left eye blurred vision  Neck:supple no JVD no bruits Heart:S1-S2 regular rate and rhythm without murmur gallop or click Lungs:clear without rales rhonchi or wheezes FAO:ZHYQ nontender positive bowel sounds pelvic liver spleen or mass Ext:no edema and 2+ radial pulses bilaterally 2+ pedal pulses bilaterally Neuro:alert oriented x3 follows commands   Assessment/Plan Patient Active Problem List  Diagnoses  . Chest pain at rest  . CAD (coronary artery disease), known LAD stent placed in 2007 in [place] ,last cath 2010 with patent stent and nonobstructive CAD  . 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 History of corneal transplant, lt. eye with blurred vision  . DM (diabetes mellitus)  . HTN (hypertension)  . Dyslipidemia  . Blindness, legal   PLAN: Admit, tele, ntg past, lovenox therapeutic dos.  Serial cardiac enzymes.  Nuc study in am.   INGOLD,LAURA R 12/18/2011, 4:44 PM  I have seen and examined the patient along with Nada Boozer, NP.  I have reviewed the chart, notes and new data.  I agree with Laura's note.  Brief Description: Very unfortunate 56 y/o man with history of LAD PCI in 2007 for severe substernal pressure with diaphoresis -- like he has never had before, as well as near blindness from glaucoma, DM & HTN.  Cath in 2010 for 10/10 cp with mild ISR of LAD stent, but no obstructive CAD.  He also complains of tingling along his L leg & arm - also not exacerbated with walking.    Key new complaints: intermittent CP of ~10-15 min at a time -- a tingling pressure, goes away on its own, not made worse with exertion. Key examination changes:  essentially normal with the exception of wearing sunglasses for near-blindness, 2 + pulses Key new findings / data: initial troponin negative; ECG with NSR & early repolarization changes similar to 2010.  PLAN: Agree with admit to r/o MI given his history, although the Sx are not consistent with his only actual Anginal (pre-stent).   Continue ASA, statin & start low-dose BB. Continue ACE-I with DM.  If r/o MI, would plan for NST in AM if possible for risk stratification since we have an idea of his coronary anatomy & balanced ischemia is not likely. If no ischemia on NST - would plan to d/c to f/u @ [facility], if + ischemia, plan cath.  DM - SSI  Continue home meds.  Marykay Lex, M.D., M.S. [facility] [address] [place], [place]  [address]  [phone]  12/18/2011 5:20 PM

## 2011-12-18 NOTE — ED Notes (Signed)
Report given to [facility], RN on 3700. Patient placed on Zoll and is being transported to 3715.

## 2011-12-18 NOTE — ED Notes (Signed)
Patient remains on monitor and 2L oxygen with sats 100%. Patient denies chest pain at this time. Patient states he is having leg pain.

## 2011-12-18 NOTE — ED Notes (Addendum)
Patient states chest pain started x 3 days ago and this morning his entire left side is numb. Patients states he is constipated x 4 days and is having abdominal pain. Patient states he has headache on left side of his head and the right side of his mouth is numb. Patient states his feet fell like they are burning. Patient states he is DM and stent placed in heart 2007.

## 2011-12-19 ENCOUNTER — Inpatient Hospital Stay (HOSPITAL_COMMUNITY): Payer: Medicare Other

## 2011-12-19 DIAGNOSIS — R079 Chest pain, unspecified: Secondary | ICD-10-CM | POA: Diagnosis not present

## 2011-12-19 DIAGNOSIS — K5900 Constipation, unspecified: Secondary | ICD-10-CM | POA: Diagnosis not present

## 2011-12-19 DIAGNOSIS — I10 Essential (primary) hypertension: Secondary | ICD-10-CM | POA: Diagnosis not present

## 2011-12-19 DIAGNOSIS — R0789 Other chest pain: Secondary | ICD-10-CM | POA: Diagnosis not present

## 2011-12-19 DIAGNOSIS — H5440 Blindness, one eye, unspecified eye: Secondary | ICD-10-CM | POA: Diagnosis not present

## 2011-12-19 DIAGNOSIS — E119 Type 2 diabetes mellitus without complications: Secondary | ICD-10-CM | POA: Diagnosis not present

## 2011-12-19 DIAGNOSIS — I2510 Atherosclerotic heart disease of native coronary artery without angina pectoris: Secondary | ICD-10-CM | POA: Diagnosis not present

## 2011-12-19 DIAGNOSIS — R209 Unspecified disturbances of skin sensation: Secondary | ICD-10-CM | POA: Diagnosis not present

## 2011-12-19 DIAGNOSIS — Z951 Presence of aortocoronary bypass graft: Secondary | ICD-10-CM | POA: Diagnosis not present

## 2011-12-19 LAB — CBC
HCT: 41.5 % (ref 39.0–52.0)
MCH: 32.2 pg (ref 26.0–34.0)
MCHC: 35.2 g/dL (ref 30.0–36.0)
MCV: 91.6 fL (ref 78.0–100.0)
Platelets: 190 10*3/uL (ref 150–400)
RDW: 13.9 % (ref 11.5–15.5)

## 2011-12-19 LAB — TSH: TSH: 0.951 u[IU]/mL (ref 0.350–4.500)

## 2011-12-19 LAB — BASIC METABOLIC PANEL
BUN: 17 mg/dL (ref 6–23)
Calcium: 9.7 mg/dL (ref 8.4–10.5)
Chloride: 102 mEq/L (ref 96–112)
Creatinine, Ser: 1.18 mg/dL (ref 0.50–1.35)
GFR calc Af Amer: 79 mL/min — ABNORMAL LOW (ref >90)
GFR calc non Af Amer: 68 mL/min — ABNORMAL LOW (ref >90)

## 2011-12-19 LAB — LIPID PANEL
HDL: 68 mg/dL (ref >39)
LDL Cholesterol: 96 mg/dL (ref 0–99)
Total CHOL/HDL Ratio: 2.6 RATIO
Triglycerides: 59 mg/dL (ref <150)
VLDL: 12 mg/dL (ref 0–40)

## 2011-12-19 LAB — GLUCOSE, CAPILLARY
Glucose-Capillary: 105 mg/dL — ABNORMAL HIGH (ref 70–99)
Glucose-Capillary: 99 mg/dL (ref 70–99)

## 2011-12-19 LAB — CARDIAC PANEL(CRET KIN+CKTOT+MB+TROPI)
CK, MB: 1.1 ng/mL (ref 0.3–4.0)
Relative Index: 0.6 (ref 0.0–2.5)
Total CK: 178 U/L (ref 7–232)
Troponin I: <0.3 ng/mL (ref <0.30)

## 2011-12-19 MED ORDER — PRAVASTATIN SODIUM 20 MG PO TABS
20.0000 mg | ORAL_TABLET | Freq: Every day | ORAL | Status: DC
  Filled 2011-12-19: qty 1

## 2011-12-19 MED ORDER — GABAPENTIN 100 MG PO CAPS
100.0000 mg | ORAL_CAPSULE | Freq: Three times a day (TID) | ORAL | Status: DC
  Administered 2011-12-19: 100 mg via ORAL
  Filled 2011-12-19 (×4): qty 1

## 2011-12-19 MED ORDER — TECHNETIUM TC 99M TETROFOSMIN IV KIT
10.0000 | PACK | Freq: Once | INTRAVENOUS | Status: AC | PRN
  Administered 2011-12-19: 10 via INTRAVENOUS

## 2011-12-19 MED ORDER — TECHNETIUM TC 99M TETROFOSMIN IV KIT
30.0000 | PACK | Freq: Once | INTRAVENOUS | Status: AC | PRN
  Administered 2011-12-19: 30 via INTRAVENOUS

## 2011-12-19 MED ORDER — REGADENOSON 0.4 MG/5ML IV SOLN
0.4000 mg | Freq: Once | INTRAVENOUS | Status: AC
  Administered 2011-12-19: 0.4 mg via INTRAVENOUS

## 2011-12-19 NOTE — Consult Note (Signed)
Requesting physician: Cardiology service  Reason for consultation: Lower extremity and upper extremity numbness and tingling  History of Present Illness: 56 yo male with history of HTN, DM, CAD initially presented for chest pain and was admitted to cardiology service for further evaluation and management now with new concern of progressively worsening lower and upper extremity tingling and numbness over the past 1-2 weeks. He reports mild symptoms in the past but this has become more bothersome in the past week. He describes numbness in both hands and both feet but denies any focal weakness. Pt reports being able to ambulate but not for long distances due to the numbness. He denies any urinary or fecal incontinence, no abdominal or urinary concerns at this time. He also denies any changes in vision, no headaches.  Allergies:  No Known Allergies    Past Medical History  Diagnosis Date  . Diabetes mellitus   . Hypertension   . Chest pain at rest 12/18/2011  . CAD (coronary artery disease), known LAD stent placed in 2007 in [place] ,last cath 2010 with patent stent and nonobstructive CAD 12/18/2011  . 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 History of corneal transplant, lt. eye with blurred vision 12/18/2011  . DM (diabetes mellitus) 12/18/2011  . HTN (hypertension) 12/18/2011  . Dyslipidemia 12/18/2011  . Blindness, legal 12/18/2011    Past Surgical History  Procedure Date  . Coronary stent placement   . Cardiac surgery     stent placed in 2007  . Coronary angioplasty   . Corneal transplant     Medications:  Scheduled Meds:   . aspirin EC  81 mg Oral Daily  . cycloSPORINE  75 mg Oral BID  . folic acid  1 mg Oral Daily  . insulin aspart  0-9 Units Subcutaneous TID WC  . isosorbide mononitrate  30 mg Oral Daily  . lisinopril  10 mg Oral Daily  . methotrexate  12.5 mg Oral Q Fri  . metoprolol tartrate  12.5 mg Oral BID  . prednisoLONE acetate  1 drop Left Eye Daily  . regadenoson  0.4 mg  Intravenous Once  . DISCONTD: aspirin  324 mg Oral NOW  . DISCONTD: aspirin  300 mg Rectal NOW  . DISCONTD: enoxaparin (LOVENOX) injection  75 mg Subcutaneous Q12H  . DISCONTD: pravastatin  20 mg Oral q1800  . DISCONTD: simvastatin  10 mg Oral q1800   Continuous Infusions:   . sodium chloride 50 mL/hr at 12/19/11 0844   PRN Meds:.acetaminophen, nitroGLYCERIN, ondansetron (ZOFRAN) IV, technetium tetrofosmin, technetium tetrofosmin  Social History:  reports that he has quit smoking. He does not have any smokeless tobacco history on file. He reports that he uses illicit drugs (Marijuana). He reports that he does not drink alcohol.  Family History  Problem Relation Age of Onset  . Heart attack Mother     Review of Systems:  Constitutional: Denies fever, chills, diaphoresis, appetite change and fatigue.  HEENT: Denies photophobia, eye pain, redness, hearing loss, ear pain, congestion, sore throat, rhinorrhea, sneezing, mouth sores, trouble swallowing, neck pain, neck stiffness and tinnitus.   Respiratory: Denies SOB, DOE, cough, chest tightness,  and wheezing.   Cardiovascular: Denies palpitations and leg swelling.  Gastrointestinal: Denies nausea, vomiting, abdominal pain, diarrhea, constipation, blood in stool and abdominal distention.  Genitourinary: Denies dysuria, urgency, frequency, hematuria, flank pain and difficulty urinating.  Musculoskeletal: Denies myalgias, back pain, joint swelling, arthralgias and gait problem.  Skin: Denies pallor, rash and wound.  Neurological: Denies  dizziness, seizures, syncope, weakness, light-headedness, headaches.  Hematological: Denies adenopathy. Easy bruising, personal or family bleeding history  Psychiatric/Behavioral: Denies suicidal ideation, mood changes, confusion, nervousness, sleep disturbance and agitation   Physical Exam:  Filed Vitals:   12/19/11 1037 12/19/11 1039 12/19/11 1136 12/19/11 1400  BP: 100/49 111/56 105/62 105/60    Pulse: 90 76 62 69  Temp:    97.8 F (36.6 C)  TempSrc:    Oral  Resp:    20  Height:      Weight:      SpO2:    98%     Intake/Output Summary (Last 24 hours) at 12/19/11 2138 Last data filed at 12/19/11 1800  Gross per 24 hour  Intake    180 ml  Output      0 ml  Net    180 ml    General: Alert, awake, oriented x3, in no acute distress. HEENT: No bruits, no goiter. Moist mucous membranes. Heart: Regular rate and rhythm, without murmurs, rubs, gallops. Lungs: Clear to auscultation bilaterally. No wheezing, no rhonchi, no rales. Abdomen: Soft, nontender, nondistended, positive bowel sounds. Extremities: No clubbing or cyanosis, no edema, positive pedal pulses. Neuro: Grossly nonfocal.  Labs on Admission:  CBC:    Component Value Date/Time   WBC 6.3 12/19/2011 0138   HGB 14.6 12/19/2011 0138   HCT 41.5 12/19/2011 0138   PLT 190 12/19/2011 0138   MCV 91.6 12/19/2011 0138   NEUTROABS 2.6 12/18/2011 1400   LYMPHSABS 1.7 12/18/2011 1400   MONOABS 0.3 12/18/2011 1400   EOSABS 0.0 12/18/2011 1400   BASOSABS 0.0 12/18/2011 1400    Basic Metabolic Panel:    Component Value Date/Time   NA 138 12/19/2011 0138   K 4.3 12/19/2011 0138   CL 102 12/19/2011 0138   CO2 27 12/19/2011 0138   BUN 17 12/19/2011 0138   CREATININE 1.18 12/19/2011 0138   GLUCOSE 110* 12/19/2011 0138   CALCIUM 9.7 12/19/2011 0138    Lab 12/19/11 0138 12/18/11 1400  WBC 6.3 4.6  HGB 14.6 16.0  HCT 41.5 45.5  PLT 190 180  MCV 91.6 91.7  MCH 32.2 32.3  MCHC 35.2 35.2  RDW 13.9 13.9  LYMPHSABS -- 1.7  MONOABS -- 0.3  EOSABS -- 0.0  BASOSABS -- 0.0  BANDABS -- --    Lab 12/19/11 0138 12/18/11 2013 12/18/11 1400  NA 138 -- 137  K 4.3 -- 4.8  CL 102 -- 102  CO2 27 -- 28  GLUCOSE 110* -- 103*  BUN 17 -- 13  CREATININE 1.18 -- 1.25  CALCIUM 9.7 -- 10.2  MG -- 1.8 --    Lab 12/18/11 1400  INR 1.01  PROTIME --   Cardiac markers:  Lab 12/19/11 1144 12/19/11 0138 12/18/11 2013  CKMB 1.1 1.1 1.2   TROPONINI <0.30 <0.30 <0.30  MYOGLOBIN -- -- --   No components found with this basename: POCBNP:3 No results found for this or any previous visit (from the past 240 hour(s)).  Radiological Exams on Admission: Ct Head W Wo Contrast  12/18/2011  *RADIOLOGY REPORT*  Clinical Data: Numbness and burning sensations in the arms and legs today.  CT HEAD WITHOUT AND WITH CONTRAST  Technique:  Contiguous axial images were obtained from the base of the skull through the vertex without and with intravenous contrast.  Contrast: 80mL OMNIPAQUE IOHEXOL 300 MG/ML IJ SOLN  Comparison: 09/12/2009.  Findings: No significant change in patchy white matter low density in both cerebral hemispheres.  The ventricles remain normal in size and position.  No intracranial hemorrhage, mass lesion, CT evidence of acute infarction or abnormal enhancement.  Unremarkable bones and included paranasal sinuses.  IMPRESSION: Stable chronic small vessel white matter ischemic changes in both cerebral hemispheres.  No acute abnormality.  Original Report Authenticated By: Darrol Angel, M.D.   Nm Myocar Multi W/spect W/wall Motion / Ef  12/19/2011  *RADIOLOGY REPORT*  Clinical Data:  Chest pain.  History of coronary artery disease, diabetes, hypertension and hyperlipidemia. Prior LAD coronary stent placement in 2007.  MYOCARDIAL IMAGING WITH SPECT (REST AND PHARMACOLOGIC-STRESS) GATED LEFT VENTRICULAR WALL MOTION STUDY LEFT VENTRICULAR EJECTION FRACTION  Technique:  Standard myocardial SPECT imaging was performed after resting intravenous injection of 10 mCi Tc-72m tetrofosmin. Subsequently, intravenous infusion of Lexiscan was performed under the supervision of the Cardiology staff.  At peak effect of the drug, 30 mCi Tc-9m tetrofosmin was injected intravenously and standard myocardial SPECT  imaging was performed.  Quantitative gated imaging was also performed to evaluate left ventricular wall motion, and estimate left ventricular ejection  fraction.  Comparison:  10/10/2006  Findings: Utilizing gated data, the end-diastolic volume is estimated to be 82 ml and the end-systolic volume 38 ml. Calculated ejection fraction is 53%.  Gated wall motion analysis suggests mild anterior hypokinesis.  However, quantitative ejection fraction is normal.  SPECT imaging shows no evidence of inducible ischemia with Lexiscan administration.  There is some encroachment upon the inferior wall by bowel activity on the stress acquisition.  IMPRESSION: No evidence of inducible myocardial ischemia.  Mild anterior wall hypokinesis with normal quantitative ejection fraction of 53%.  Original Report Authenticated By: Reola Calkins, M.D.   Dg Chest Port 1 View  12/18/2011  *RADIOLOGY REPORT*  Clinical Data: Chest pain for the past 4 days.  Shortness of breath.  PORTABLE CHEST - 1 VIEW  Comparison: Chest x-ray 09/12/2009.  Findings: The Lung volumes are normal.  No consolidative airspace disease.  No pleural effusions.  No pneumothorax.  No pulmonary nodule or mass noted.  Pulmonary vasculature and the cardiomediastinal silhouette are within normal limits.  IMPRESSION: 1. No radiographic evidence of acute cardiopulmonary disease.  Original Report Authenticated By: Florencia Reasons, M.D.    Ct Head W Wo Contrast  12/18/2011  *RADIOLOGY REPORT*  Clinical Data: Numbness and burning sensations in the arms and legs today.  CT HEAD WITHOUT AND WITH CONTRAST  Technique:  Contiguous axial images were obtained from the base of the skull through the vertex without and with intravenous contrast.  Contrast: 80mL OMNIPAQUE IOHEXOL 300 MG/ML IJ SOLN  Comparison: 09/12/2009.  Findings: No significant change in patchy white matter low density in both cerebral hemispheres.  The ventricles remain normal in size and position.  No intracranial hemorrhage, mass lesion, CT evidence of acute infarction or abnormal enhancement.  Unremarkable bones and included paranasal sinuses.  IMPRESSION:  Stable chronic small vessel white matter ischemic changes in both cerebral hemispheres.  No acute abnormality.  Original Report Authenticated By: Darrol Angel, M.D.   Nm Myocar Multi W/spect W/wall Motion / Ef  12/19/2011  *RADIOLOGY REPORT*  Clinical Data:  Chest pain.  History of coronary artery disease, diabetes, hypertension and hyperlipidemia. Prior LAD coronary stent placement in 2007.  MYOCARDIAL IMAGING WITH SPECT (REST AND PHARMACOLOGIC-STRESS) GATED LEFT VENTRICULAR WALL MOTION STUDY LEFT VENTRICULAR EJECTION FRACTION  Technique:  Standard myocardial SPECT imaging was performed after resting intravenous injection of 10 mCi Tc-52m tetrofosmin. Subsequently, intravenous infusion of Lexiscan  was performed under the supervision of the Cardiology staff.  At peak effect of the drug, 30 mCi Tc-39m tetrofosmin was injected intravenously and standard myocardial SPECT  imaging was performed.  Quantitative gated imaging was also performed to evaluate left ventricular wall motion, and estimate left ventricular ejection fraction.  Comparison:  10/10/2006  Findings: Utilizing gated data, the end-diastolic volume is estimated to be 82 ml and the end-systolic volume 38 ml. Calculated ejection fraction is 53%.  Gated wall motion analysis suggests mild anterior hypokinesis.  However, quantitative ejection fraction is normal.  SPECT imaging shows no evidence of inducible ischemia with Lexiscan administration.  There is some encroachment upon the inferior wall by bowel activity on the stress acquisition.  IMPRESSION: No evidence of inducible myocardial ischemia.  Mild anterior wall hypokinesis with normal quantitative ejection fraction of 53%.  Original Report Authenticated By: Reola Calkins, M.D.   Dg Chest Port 1 View  12/18/2011  *RADIOLOGY REPORT*  Clinical Data: Chest pain for the past 4 days.  Shortness of breath.  PORTABLE CHEST - 1 VIEW  Comparison: Chest x-ray 09/12/2009.  Findings: The Lung volumes are  normal.  No consolidative airspace disease.  No pleural effusions.  No pneumothorax.  No pulmonary nodule or mass noted.  Pulmonary vasculature and the cardiomediastinal silhouette are within normal limits.  IMPRESSION: 1. No radiographic evidence of acute cardiopulmonary disease.  Original Report Authenticated By: Florencia Reasons, M.D.    Assessment/Plan Principal Problem:  *Chest pain at rest - pt denies chest pain today - this is followed and managed by cardiology service  Active Problems:  Lower and upper extremity numbness and tingling - review of records indicate that this is chronic problem for the pt as he has presented to ED since 2010 with same concern which is now getting worse - certainly does not appear to be of an acute nature - I am questioning diabetic neuropathy as he [place] describing "glove and stocking" distribution of symptoms - diabetes appears to be well controlled but it is still possible to have neuropathy even in the setting of well controlled diabetes - my suspicion for CVS is low given normal neurological physical exam findings and negative CT head - can start with neurontin 100 mg TID and readjust the medication regimen as indicated   DM (diabetes mellitus) - well controlled with A1C 6.0 - continue same medication regimen   HTN (hypertension) - appears to be well controlled during the hospitalization   Dyslipidemia - continue statin  EDUCATION - test results and diagnostic studies were discussed with patient  - patien verbalized the understanding - questions were answered at the bedside and contact information was provided for additional questions or concerns  Time Spent on Admission: Over 30 minutes  MAGICK-Tamryn Popko 12/19/2011, 9:38 PM  [facility] [phone]

## 2011-12-19 NOTE — Progress Notes (Signed)
Pt arrived from ED via stretcher transported by ED RN.  When pt attempted to stand to ambulate to bed, pt became unsteady on feet and could not stand.  Pt assisted back to stretcher and stand-pivoted to bed.  Neuro assessment completed.  Pt with significant left sided weakness.  All other neuro assessment WNL.  Pt states that both arms are tingling and L arm is numb, R leg numb.  Nada Boozer, PA notified.  Orders rec'd.  Will continue to monitor.  Kenyona Rena, [facility]

## 2011-12-19 NOTE — Progress Notes (Addendum)
56 year old with a history of coronary artery disease with a stent to the LAD in 2007 in [place]. He had his last heart catheterization 2010 after presenting with chest pain was 10 out of 10 in intensity he underwent cardiac catheterization and was found to have patent LAD stent there we did have in stent restenosis of about 30%, 20-30% proximal stenosis of the left circumflex and right coronary artery that is codominant with 25-30% proximal mid stenosis LV function was 65% Patient states 3-5 days ago he started having chest pain across his chest that down both legs the numbness and tingling and burning also into his stomach he is mildly short of breath with this no nausea only once did he awakened from sleep with the discomfort and diaphoresis. He has not had to change his activity at all with the pain as exertion does not increase the pain.   On arrival to floor he had lt. Sided weakness, along with the numbness and tingling.  CT head without contrast was negative and unchanged.  Subjective: Continues with mild chest pressure,  Continues with numbness of arms and legs.   Objective: Vital signs in last 24 hours: Temp:  [97.4 F (36.3 C)-98.7 F (37.1 C)] 97.7 F (36.5 C) (03/10 0500) Pulse Rate:  [56-78] 68  (03/10 0500) Resp:  [9-20] 16  (03/10 0500) BP: (109-151)/(62-97) 116/66 mmHg (03/10 0500) SpO2:  [99 %-100 %] 99 % (03/10 0500) Weight:  [74.299 kg (163 lb 12.8 oz)-76.658 kg (169 lb)] 74.299 kg (163 lb 12.8 oz) (03/09 1904) Weight change:  Last BM Date: 12/15/11 (patient requests laxative after AM myoview) Intake/Output from previous day:   Intake/Output this shift:    PE: General:A&O X 3, Pleasant affect Heart:S1S2, RRR Lungs: clear without rales, rhonchi or wheezes Abd:+BS soft non tender Ext:no edema Neuro:alert and orientated, MAE    Lab Results:  Basename 12/19/11 0138 12/18/11 1400  WBC 6.3 4.6  HGB 14.6 16.0  HCT 41.5 45.5  PLT 190 180   BMET  Basename  12/19/11 0138 12/18/11 1400  NA 138 137  K 4.3 4.8  CL 102 102  CO2 27 28  GLUCOSE 110* 103*  BUN 17 13  CREATININE 1.18 1.25  CALCIUM 9.7 10.2    Basename 12/19/11 0138 12/18/11 2013  TROPONINI <0.30 <0.30    Lab Results  Component Value Date   CHOL 176 12/19/2011   HDL 68 12/19/2011   LDLCALC 96 12/19/2011   TRIG 59 12/19/2011   CHOLHDL 2.6 12/19/2011   Lab Results  Component Value Date   HGBA1C  Value: 6.6 (NOTE) The ADA recommends the following therapeutic goal for glycemic control related to Hgb A1c measurement: Goal of therapy: <6.5 Hgb A1c  Reference: American Diabetes Association: Clinical Practice Recommendations 2010, Diabetes Care, 2010, 33: (Suppl  1).* 07/28/2009       Hepatic Function Panel  Basename 12/18/11 1400  PROT 7.9  ALBUMIN 4.3  AST 20  ALT 17  ALKPHOS 50  BILITOT 1.1  BILIDIR --  IBILI --    Basename 12/19/11 0138  CHOL 176   No results found for this basename: PROTIME in the last 72 hours  Studies/Results: Ct Head W Wo Contrast  12/18/2011  *RADIOLOGY REPORT*  Clinical Data: Numbness and burning sensations in the arms and legs today.  CT HEAD WITHOUT AND WITH CONTRAST  Technique:  Contiguous axial images were obtained from the base of the skull through the vertex without and with intravenous contrast.  Contrast: 80mL OMNIPAQUE IOHEXOL 300 MG/ML IJ SOLN  Comparison: 09/12/2009.  Findings: No significant change in patchy white matter low density in both cerebral hemispheres.  The ventricles remain normal in size and position.  No intracranial hemorrhage, mass lesion, CT evidence of acute infarction or abnormal enhancement.  Unremarkable bones and included paranasal sinuses.  IMPRESSION: Stable chronic small vessel white matter ischemic changes in both cerebral hemispheres.  No acute abnormality.  Original Report Authenticated By: Darrol Angel, M.D.   Dg Chest Port 1 View  12/18/2011  *RADIOLOGY REPORT*  Clinical Data: Chest pain for the past 4  days.  Shortness of breath.  PORTABLE CHEST - 1 VIEW  Comparison: Chest x-ray 09/12/2009.  Findings: The Lung volumes are normal.  No consolidative airspace disease.  No pleural effusions.  No pneumothorax.  No pulmonary nodule or mass noted.  Pulmonary vasculature and the cardiomediastinal silhouette are within normal limits.  IMPRESSION: 1. No radiographic evidence of acute cardiopulmonary disease.  Original Report Authenticated By: Florencia Reasons, M.D.    Medications: I have reviewed the patient's current medications.    Marland Kitchen aspirin  324 mg Oral Once  . aspirin  324 mg Oral NOW   Or  . aspirin  300 mg Rectal NOW  . aspirin EC  81 mg Oral Daily  . cycloSPORINE  75 mg Oral BID  . enoxaparin (LOVENOX) injection  75 mg Subcutaneous Q12H  . folic acid  1 mg Oral Daily  . insulin aspart  0-9 Units Subcutaneous TID WC  . isosorbide mononitrate  30 mg Oral Daily  . lisinopril  10 mg Oral Daily  . methotrexate  12.5 mg Oral Q Fri  . metoprolol tartrate  12.5 mg Oral BID  . nitroGLYCERIN  1 inch Topical Once  . prednisoLONE acetate  1 drop Left Eye Daily  . simvastatin  10 mg Oral q1800  . DISCONTD: nitroGLYCERIN  1 inch Topical Q6H   Assessment/Plan: Patient Active Problem List  Diagnoses  . Chest pain at rest  . CAD (coronary artery disease), known LAD stent placed in 2007 in [place] ,last cath 2010 with patent stent and nonobstructive CAD  . 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 History of corneal transplant, lt. eye with blurred vision  . DM (diabetes mellitus)  . HTN (hypertension)  . Dyslipidemia  . Blindness, legal   PLAN:  For nuclear stress  Test today.  Negative cardiac enzymes.  Addition:  Completed Lexiscan myoview without complications, no chest pain, no SOB.  LOS: 1 day   INGOLD,LAURA R 12/19/2011, 10:10 AM  LexiScan Myoview: No inducible myocardial ischemia. Mild anterior wall hypokinesis with EF of 53%. No evidence of infarct.  I have seen the patient along with Nada Boozer, NP. I agree with her findings as noted above. I agree with the examination as well.  Is a very complex situation and 56 year old on a very strange complement of symptoms including bilateral leg numbness left worse than right with some weakness. He scattered hand paresthesias this discomfort in his chest that is makes it very hard to find a unifying diagnosis.  We have performed a nuclear stress test which showed no evidence of ischemia which would suggest that his previously placed LAD stent is patent with no significant stenosis. His chest is most likely nonanginal/noncardiac.  With this in mind he will be stable for discharge from cardiac standpoint. However I still think we have not answered question. Certainly so the paresthesias could be related  to diabetic neuropathy but the leg weaknesses are confusing.   We did do a head CT without contrast last night which showed no acute abnormalities, which makes a stroke less likely.  At this point I think he still requires a lumbar workup but would probably better served to have his deformed by an internist with less focused on the heart and vascular system. I have contacted the hospitalist to consult and provide assistance with this diagnostic dilemma. Appreciate any input.  At this point off for quite stable he will be ready for discharge pending any other evaluation that the the hospital is like to perform. I was thinking he apply home tomorrow which will allow time to see [place] in the doing was up walking around etc. He is somewhat limited due to his near blindness.  We can discontinue the Lovenox. He is aspirin ACE inhibitor beta blocker statin. He is no longer on Plavix following his stent in 2007.  One of the potential etiologies that he's been on simvastatin in combination with cyclosporine which does have some contraindication due to myopathies and rhabdomyolysis, so will discontinue simvastatin for now,  andthen switch to pravastatin  as an  outpatient if his symptoms improve.  Rhabdomyolysis is unlikely as his CPK was not elevated.  For his diabetes he is on sliding scale insulin. We continued his methotrexate and cyclosporine, but I'm unclear as to why these are being used at the yesterday with his glaucoma or the corneal transplant.  Marykay Lex, M.D., M.S. [facility] [address] [place], [place]  [address]  [phone]  12/19/2011 3:29 PM  A

## 2011-12-20 ENCOUNTER — Inpatient Hospital Stay (HOSPITAL_COMMUNITY): Payer: Medicare Other

## 2011-12-20 DIAGNOSIS — R109 Unspecified abdominal pain: Secondary | ICD-10-CM | POA: Diagnosis not present

## 2011-12-20 DIAGNOSIS — K5900 Constipation, unspecified: Secondary | ICD-10-CM | POA: Diagnosis not present

## 2011-12-20 DIAGNOSIS — I10 Essential (primary) hypertension: Secondary | ICD-10-CM | POA: Diagnosis not present

## 2011-12-20 DIAGNOSIS — R0789 Other chest pain: Secondary | ICD-10-CM | POA: Diagnosis not present

## 2011-12-20 DIAGNOSIS — H5440 Blindness, one eye, unspecified eye: Secondary | ICD-10-CM | POA: Diagnosis not present

## 2011-12-20 DIAGNOSIS — I2510 Atherosclerotic heart disease of native coronary artery without angina pectoris: Secondary | ICD-10-CM | POA: Diagnosis not present

## 2011-12-20 DIAGNOSIS — R079 Chest pain, unspecified: Secondary | ICD-10-CM | POA: Diagnosis not present

## 2011-12-20 DIAGNOSIS — R209 Unspecified disturbances of skin sensation: Secondary | ICD-10-CM | POA: Diagnosis not present

## 2011-12-20 DIAGNOSIS — E119 Type 2 diabetes mellitus without complications: Secondary | ICD-10-CM | POA: Diagnosis not present

## 2011-12-20 DIAGNOSIS — Z951 Presence of aortocoronary bypass graft: Secondary | ICD-10-CM | POA: Diagnosis not present

## 2011-12-20 LAB — GLUCOSE, CAPILLARY: Glucose-Capillary: 123 mg/dL — ABNORMAL HIGH (ref 70–99)

## 2011-12-20 MED ORDER — DOCUSATE SODIUM 100 MG PO CAPS
100.0000 mg | ORAL_CAPSULE | Freq: Two times a day (BID) | ORAL | Status: DC
  Administered 2011-12-20 – 2011-12-21 (×2): 100 mg via ORAL
  Filled 2011-12-20 (×4): qty 1

## 2011-12-20 MED ORDER — GABAPENTIN 100 MG PO CAPS
200.0000 mg | ORAL_CAPSULE | Freq: Three times a day (TID) | ORAL | Status: DC
  Filled 2011-12-20 (×2): qty 2

## 2011-12-20 MED ORDER — POLYETHYLENE GLYCOL 3350 17 G PO PACK
17.0000 g | PACK | Freq: Two times a day (BID) | ORAL | Status: DC
  Administered 2011-12-20 – 2011-12-21 (×3): 17 g via ORAL
  Filled 2011-12-20 (×4): qty 1

## 2011-12-20 MED ORDER — GABAPENTIN 100 MG PO CAPS
200.0000 mg | ORAL_CAPSULE | Freq: Three times a day (TID) | ORAL | Status: DC
  Administered 2011-12-20 (×3): 200 mg via ORAL
  Filled 2011-12-20 (×6): qty 2

## 2011-12-20 MED ORDER — POLYETHYLENE GLYCOL 3350 17 G PO PACK
17.0000 g | PACK | Freq: Once | ORAL | Status: AC
  Administered 2011-12-20: 17 g via ORAL
  Filled 2011-12-20: qty 1

## 2011-12-20 NOTE — Evaluation (Signed)
Physical Therapy Evaluation Patient Details Name: FARAH BENISH MRN: [id number] DOB: October 22, 1955 Today's Date: 12/20/2011  Problem List:  Patient Active Problem List  Diagnoses  . Chest pain at rest  . CAD (coronary artery disease), known LAD stent placed in 2007 in [place] ,last cath 2010 with patent stent and nonobstructive CAD  . 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 History of corneal transplant, lt. eye with blurred vision  . DM (diabetes mellitus)  . HTN (hypertension)  . Dyslipidemia  . Blindness, legal    Past Medical History:  Past Medical History  Diagnosis Date  . Diabetes mellitus   . Hypertension   . Chest pain at rest 12/18/2011  . CAD (coronary artery disease), known LAD stent placed in 2007 in [place] ,last cath 2010 with patent stent and nonobstructive CAD 12/18/2011  . 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 History of corneal transplant, lt. eye with blurred vision 12/18/2011  . DM (diabetes mellitus) 12/18/2011  . HTN (hypertension) 12/18/2011  . Dyslipidemia 12/18/2011  . Blindness, legal 12/18/2011   Past Surgical History:  Past Surgical History  Procedure Date  . Coronary stent placement   . Cardiac surgery     stent placed in 2007  . Coronary angioplasty   . Corneal transplant     PT Assessment/Plan/Recommendation PT Assessment Clinical Impression Statement: Pt is 56 yo male who presented with chest pain which has resolved but all has recent onset of numbness bilateral hands and feet.  Given his visual deficits, educated him on the balance issues that this can cause as he ages and gave him a balance program to perform at home.  Pt is mod I with activity and is encouraged to ambulate as tolerated.  No further acute or f/u PT needs. PT Recommendation/Assessment: Patent does not need any further PT services No Skilled PT: Patient is modified independent with all activity/mobility PT Recommendation Follow Up Recommendations: No PT follow up Equipment Recommended: None  recommended by PT PT Goals  Acute Rehab PT Goals PT Goal Formulation: With patient (N/A) Time For Goal Achievement:  (N/A)  PT Evaluation Precautions/Restrictions  Precautions Required Braces or Orthoses: No Restrictions Weight Bearing Restrictions: No Prior Functioning  Home Living Lives With: Daughter (daughters 51 and 72 yo) Receives Help From: Family Type of Home: Apartment Home Layout: Two level Alternate Level Stairs-Rails: Right Alternate Level Stairs-Number of Steps: 13 Home Access: Level entry Home Adaptive Equipment: None Prior Function Level of Independence: Independent with basic ADLs;Independent with homemaking with ambulation;Independent with gait;Independent with transfers Able to Take Stairs?: Reciprically Driving: Yes Vocation: On disability Comments: Pt is blind in right eye from glaucoma and has poor vision in left eye, especially peripherally.  He has a contact lens for the left eye that allows him to see peripherally, he did not have it on eval.  Pt has had onset of numbness and tingling in bilateral hands and feet over the past 2 weeks. Cognition Cognition Arousal/Alertness: Awake/alert Overall Cognitive Status: Appears within functional limits for tasks assessed Orientation Level: Oriented X4 Sensation/Coordination Sensation Light Touch: Impaired Detail Light Touch Impaired Details: Impaired RUE;Impaired LUE;Impaired RLE;Impaired LLE Stereognosis: Appears Intact Hot/Cold: Not tested Proprioception: Impaired by gross assessment Coordination Gross Motor Movements are Fluid and Coordinated: Yes Fine Motor Movements are Fluid and Coordinated: Yes Extremity Assessment RUE Assessment RUE Assessment: Within Functional Limits LUE Assessment LUE Assessment: Within Functional Limits RLE Assessment RLE Assessment: Within Functional Limits LLE Assessment LLE Assessment: Within Functional Limits Mobility (including  Balance) Bed Mobility Bed Mobility: No  (pt reports independence) Transfers Transfers: Yes Sit to Stand: 6: Modified independent (Device/Increase time);From bed;With upper extremity assist Stand to Sit: 6: Modified independent (Device/Increase time);With upper extremity assist;To bed Ambulation/Gait Ambulation/Gait: Yes Ambulation/Gait Assistance: 6: Modified independent (Device/Increase time) Ambulation Distance (Feet): 400 Feet Assistive device: None Gait Pattern: Within Functional Limits Gait velocity: WFL Stairs: Yes Stairs Assistance: 6: Modified independent (Device/Increase time) Stair Management Technique: One rail Right;Alternating pattern;Forwards Number of Stairs: 5  (only 5 due to IV pole) Wheelchair Mobility Wheelchair Mobility: No  Posture/Postural Control Posture/Postural Control: No significant limitations Balance Balance Assessed: Yes High Level Balance High Level Balance Activites: Direction changes High Level Balance Comments: Pt has difficulty with unilateral stance, right worse than left, LOB after 2 sec with self-correction.  He assumes tandem stance with independence and no LOB and is able to raise onto toes with no LOB.  Pt educated on components of balance and the fact that he is limited in vision and now sensory input and is relying heavily on his vestibular system.  Instructed pt to perform balance exercises at least once/ day.  Exercise  Other Exercises Other Exercises: unilateral stance, each leg x 30sec Other Exercises: tandem stance with each leg in front, x30 sec each Other Exercises: heel raises x 10 with no UE support Other Exercises: same exercises with the following changes: eyes closed, unstable surface (standing on pillow) End of Session PT - End of Session Equipment Utilized During Treatment: Gait belt Activity Tolerance: Patient tolerated treatment well Patient left: in bed;with call bell in reach Nurse Communication: Mobility status for ambulation General Behavior During  Session: [facility] for tasks performed Cognition: [facility] for tasks performed  Kade Rickels, [place] 12/20/2011, 2:20 PM

## 2011-12-20 NOTE — Progress Notes (Signed)
[facility]  Subjective: Numbness and tingling in hands and lower ext continues.  Also complaining of right lower quad pain and right shoulder pain.  Claiming he can not raise his arm above the shoulder level.  Last BM was about five days ago.  Unintentional wt loss:  177 in Dec and 163 now.  Remote tobacco use.  Objective: Vital signs in last 24 hours: Temp:  [97.8 F (36.6 C)-98.1 F (36.7 C)] 98.1 F (36.7 C) (03/11 0500) Pulse Rate:  [57-69] 65  (03/11 1109) Resp:  [18-20] 18  (03/11 0500) BP: (101-129)/(58-66) 115/64 mmHg (03/11 1106) SpO2:  [98 %-99 %] 99 % (03/11 0500) Last BM Date: 12/15/11 (patient requests laxative after AM myoview)  Intake/Output from previous day: 03/10 0701 - 03/11 0700 In: 180 [P.O.:180] Out: 250 [Urine:250] Intake/Output this shift: Total I/O In: 240 [P.O.:240] Out: -   Medications Current Facility-Administered Medications  Medication Dose Route Frequency Provider Last Rate Last Dose  . 0.9 %  sodium chloride infusion   Intravenous Continuous Nada Boozer, NP 50 mL/hr at 12/19/11 0844    . acetaminophen (TYLENOL) tablet 650 mg  650 mg Oral Q4H PRN Nada Boozer, NP   650 mg at 12/20/11 4098  . aspirin EC tablet 81 mg  81 mg Oral Daily Nada Boozer, NP   81 mg at 12/20/11 1104  . cycloSPORINE (SANDIMMUNE) capsule 75 mg  75 mg Oral BID Nada Boozer, NP   75 mg at 12/20/11 1105  . folic acid (FOLVITE) tablet 1 mg  1 mg Oral Daily Nada Boozer, NP   1 mg at 12/20/11 1105  . gabapentin (NEURONTIN) capsule 200 mg  200 mg Oral TID Marykay Lex, MD      . insulin aspart (novoLOG) injection 0-9 Units  0-9 Units Subcutaneous TID WC Nada Boozer, NP      . isosorbide mononitrate (IMDUR) 24 hr tablet 30 mg  30 mg Oral Daily Nada Boozer, NP   30 mg at 12/20/11 1105  . lisinopril (PRINIVIL,ZESTRIL) tablet 10 mg  10 mg Oral Daily Nada Boozer, NP   10 mg at 12/20/11 1106  . methotrexate (RHEUMATREX) tablet 12.5 mg  12.5 mg Oral  Q Fri Nada Boozer, NP      . metoprolol tartrate (LOPRESSOR) tablet 12.5 mg  12.5 mg Oral BID Nada Boozer, NP   12.5 mg at 12/20/11 1109  . nitroGLYCERIN (NITROSTAT) SL tablet 0.4 mg  0.4 mg Sublingual Q5 Min x 3 PRN Nada Boozer, NP      . ondansetron [facility]) injection 4 mg  4 mg Intravenous Q6H PRN Nada Boozer, NP      . prednisoLONE acetate (PRED FORTE) 1 % ophthalmic suspension 1 drop  1 drop Left Eye Daily Nada Boozer, NP   1 drop at 12/20/11 1105  . DISCONTD: aspirin chewable tablet 324 mg  324 mg Oral NOW Nada Boozer, NP      . DISCONTD: aspirin suppository 300 mg  300 mg Rectal NOW Nada Boozer, NP      . DISCONTD: enoxaparin (LOVENOX) injection 75 mg  75 mg Subcutaneous Q12H Ann Held, PHARMD   75 mg at 12/19/11 1135  . DISCONTD: gabapentin (NEURONTIN) capsule 100 mg  100 mg Oral TID Dorothea Ogle, MD   100 mg at 12/19/11 2300  . DISCONTD: gabapentin (NEURONTIN) capsule 200 mg  200 mg Oral TID Dorothea Ogle, MD      . DISCONTD: pravastatin (PRAVACHOL) tablet 20  mg  20 mg Oral q1800 Joline Salt Barrett, PA      . DISCONTD: simvastatin (ZOCOR) tablet 10 mg  10 mg Oral q1800 Nada Boozer, NP        PE: General appearance: alert, cooperative and no distress Lungs: clear to auscultation bilaterally Heart: regular rate and rhythm, S1, S2 normal, no murmur, click, rub or gallop Abdomen: Abdomen appears tense.  +BS in all quads.  7/10 right lower quadrant tenderness without rebound.Marland Kitchen  LLQ is also tender..  No masses or bruits. Extremities: No LEE Pulses: 2+ and symmetric Neurologic: Strength 5/5 equal upper and lower ext. MS:  Tender to palpation around [facility] joint.  Decreased range of motion. Can abduct to 90 deg. But  Then experiences pain.  Lab Results:   Basename 12/19/11 0138 12/18/11 1400  WBC 6.3 4.6  HGB 14.6 16.0  HCT 41.5 45.5  PLT 190 180   BMET  Basename 12/19/11 0138 12/18/11 1400  NA 138 137  K 4.3 4.8  CL 102 102  CO2 27 28  GLUCOSE 110* 103*  BUN 17  13  CREATININE 1.18 1.25  CALCIUM 9.7 10.2   PT/INR  Basename 12/18/11 1400  LABPROT 13.5  INR 1.01   Cholesterol  Basename 12/19/11 0138  CHOL 176    Assessment/Plan   Principal Problem:  *Chest pain at rest Active Problems:  CAD (coronary artery disease), known LAD stent placed in 2007 in [place] 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 eye with blurred vision  DM (diabetes mellitus)  HTN (hypertension)  Dyslipidemia  Blindness, legal  New:   1. Right and left lower quad Abd pain.    2. Right shoulder pain  3. Weight loss, unintentional  Plan:  Negative Nuc.  Troponin negative times three.  HTN well controlled 96/63 - 129/66.  Gabapentin started by IM.  WBCs not elevated. No BM for ~5 days. Will add miralax and get a KUB.  Probably OP ortho follow-up for shoulder would be appropriate. Etiology of weight loss unclear.  No family history of any type of cancer.    LOS: 2 days    HAGER,BRYAN W 12/20/2011 11:19 AM  I have seen & examined the patient.  I agree with Bryan's note.  Exam is relatively normal.  So far the work-up has been "normal" .  His paraesthesias are doing better with Neurontin, but still present.  Dose increased today.  This does seem like a more chronic issue.  Etiology of wgt loss is unclear, but he & I are both concerned re: the possibility of malignancy --> perhaps with his neurologis symptoms being treated, his wgt will pick up.  This can be evaluated in the outpt setting.  DM - well controlled on SSE On MTX & Cyclosporine for corneal transplant  CAD seems stable - no more CP & NST negative for ischemia. Would restart statin -- pravastatin & not simvastatin on discharge due to cross-reactivity with cyclosporine.  On BB & ACE, ASA  Agree with bowel regimen.  At this point, with his symptoms improving notably - I would like to ensure that he tolerates the increased  dose of Neurontin today.  Anticipate d/c tomorrow if he continues to do well.  Will need OP f/u established with PMD & [facility].  Marykay Lex, M.D., M.S. [facility] [address] [place], [place]  [address]  [phone]  12/20/2011  5:39 PM

## 2011-12-20 NOTE — Progress Notes (Signed)
Patient ID: Steven [name], male   DOB: 04-22-1956, 56 y.o.   MRN: [id number]  Subjective: No events overnight. Patient denies chest pain, shortness of breath, abdominal pain. Still reports numbness and tingling in hands and feet.  Objective:  Vital signs in last 24 hours:  Filed Vitals:   12/20/11 0500 12/20/11 1106 12/20/11 1109 12/20/11 1309  BP: 129/66 115/64  114/67  Pulse: 65  65 63  Temp: 98.1 F (36.7 C)   98.2 F (36.8 C)  TempSrc:    Oral  Resp: 18   18  Height:      Weight:      SpO2: 99%   99%    Intake/Output from previous day:   Intake/Output Summary (Last 24 hours) at 12/20/11 1343 Last data filed at 12/20/11 0900  Gross per 24 hour  Intake    360 ml  Output    250 ml  Net    110 ml    Physical Exam: General: Alert, awake, oriented x3, in no acute distress. HEENT: No bruits, no goiter. Moist mucous membranes, no scleral icterus, no conjunctival pallor. Heart: Regular rate and rhythm, S1/S2 +, no murmurs, rubs, gallops. Lungs: Clear to auscultation bilaterally. No wheezing, no rhonchi, no rales.  Abdomen: Soft, nontender, nondistended, positive bowel sounds. Extremities: No clubbing or cyanosis, no pitting edema,  positive pedal pulses. Neuro: Grossly nonfocal.  Lab Results:  Basic Metabolic Panel:    Component Value Date/Time   NA 138 12/19/2011 0138   K 4.3 12/19/2011 0138   CL 102 12/19/2011 0138   CO2 27 12/19/2011 0138   BUN 17 12/19/2011 0138   CREATININE 1.18 12/19/2011 0138   GLUCOSE 110* 12/19/2011 0138   CALCIUM 9.7 12/19/2011 0138   CBC:    Component Value Date/Time   WBC 6.3 12/19/2011 0138   HGB 14.6 12/19/2011 0138   HCT 41.5 12/19/2011 0138   PLT 190 12/19/2011 0138   MCV 91.6 12/19/2011 0138   NEUTROABS 2.6 12/18/2011 1400   LYMPHSABS 1.7 12/18/2011 1400   MONOABS 0.3 12/18/2011 1400   EOSABS 0.0 12/18/2011 1400   BASOSABS 0.0 12/18/2011 1400      Lab 12/19/11 0138 12/18/11 1400  WBC 6.3 4.6  HGB 14.6 16.0  HCT 41.5 45.5  PLT 190  180  MCV 91.6 91.7  MCH 32.2 32.3  MCHC 35.2 35.2  RDW 13.9 13.9  LYMPHSABS -- 1.7  MONOABS -- 0.3  EOSABS -- 0.0  BASOSABS -- 0.0  BANDABS -- --    Lab 12/19/11 0138 12/18/11 2013 12/18/11 1400  NA 138 -- 137  K 4.3 -- 4.8  CL 102 -- 102  CO2 27 -- 28  GLUCOSE 110* -- 103*  BUN 17 -- 13  CREATININE 1.18 -- 1.25  CALCIUM 9.7 -- 10.2  MG -- 1.8 --    Lab 12/18/11 1400  INR 1.01  PROTIME --   Cardiac markers:  Lab 12/19/11 1144 12/19/11 0138 12/18/11 2013  CKMB 1.1 1.1 1.2  TROPONINI <0.30 <0.30 <0.30  MYOGLOBIN -- -- --   No components found with this basename: POCBNP:3 No results found for this or any previous visit (from the past 240 hour(s)).  Studies/Results: Ct Head W Wo Contrast  12/18/2011  *RADIOLOGY REPORT*  Clinical Data: Numbness and burning sensations in the arms and legs today.  CT HEAD WITHOUT AND WITH CONTRAST  Technique:  Contiguous axial images were obtained from the base of the skull through the vertex without and with intravenous  contrast.  Contrast: 80mL OMNIPAQUE IOHEXOL 300 MG/ML IJ SOLN  Comparison: 09/12/2009.  Findings: No significant change in patchy white matter low density in both cerebral hemispheres.  The ventricles remain normal in size and position.  No intracranial hemorrhage, mass lesion, CT evidence of acute infarction or abnormal enhancement.  Unremarkable bones and included paranasal sinuses.  IMPRESSION: Stable chronic small vessel white matter ischemic changes in both cerebral hemispheres.  No acute abnormality.  Original Report Authenticated By: Darrol Angel, M.D.   Nm Myocar Multi W/spect W/wall Motion / Ef  12/19/2011  *RADIOLOGY REPORT*  Clinical Data:  Chest pain.  History of coronary artery disease, diabetes, hypertension and hyperlipidemia. Prior LAD coronary stent placement in 2007.  MYOCARDIAL IMAGING WITH SPECT (REST AND PHARMACOLOGIC-STRESS) GATED LEFT VENTRICULAR WALL MOTION STUDY LEFT VENTRICULAR EJECTION FRACTION   Technique:  Standard myocardial SPECT imaging was performed after resting intravenous injection of 10 mCi Tc-35m tetrofosmin. Subsequently, intravenous infusion of Lexiscan was performed under the supervision of the Cardiology staff.  At peak effect of the drug, 30 mCi Tc-75m tetrofosmin was injected intravenously and standard myocardial SPECT  imaging was performed.  Quantitative gated imaging was also performed to evaluate left ventricular wall motion, and estimate left ventricular ejection fraction.  Comparison:  10/10/2006  Findings: Utilizing gated data, the end-diastolic volume is estimated to be 82 ml and the end-systolic volume 38 ml. Calculated ejection fraction is 53%.  Gated wall motion analysis suggests mild anterior hypokinesis.  However, quantitative ejection fraction is normal.  SPECT imaging shows no evidence of inducible ischemia with Lexiscan administration.  There is some encroachment upon the inferior wall by bowel activity on the stress acquisition.  IMPRESSION: No evidence of inducible myocardial ischemia.  Mild anterior wall hypokinesis with normal quantitative ejection fraction of 53%.  Original Report Authenticated By: Reola Calkins, M.D.   Dg Chest Port 1 View  12/18/2011  *RADIOLOGY REPORT*  Clinical Data: Chest pain for the past 4 days.  Shortness of breath.  PORTABLE CHEST - 1 VIEW  Comparison: Chest x-ray 09/12/2009.  Findings: The Lung volumes are normal.  No consolidative airspace disease.  No pleural effusions.  No pneumothorax.  No pulmonary nodule or mass noted.  Pulmonary vasculature and the cardiomediastinal silhouette are within normal limits.  IMPRESSION: 1. No radiographic evidence of acute cardiopulmonary disease.  Original Report Authenticated By: Florencia Reasons, M.D.    Medications: Scheduled Meds:   . aspirin EC  81 mg Oral Daily  . cycloSPORINE  75 mg Oral BID  . folic acid  1 mg Oral Daily  . gabapentin  200 mg Oral TID  . insulin aspart  0-9 Units  Subcutaneous TID WC  . isosorbide mononitrate  30 mg Oral Daily  . lisinopril  10 mg Oral Daily  . methotrexate  12.5 mg Oral Q Fri  . metoprolol tartrate  12.5 mg Oral BID  . polyethylene glycol  17 g Oral BID  . prednisoLONE acetate  1 drop Left Eye Daily  . DISCONTD: aspirin  324 mg Oral NOW  . DISCONTD: aspirin  300 mg Rectal NOW  . DISCONTD: enoxaparin (LOVENOX) injection  75 mg Subcutaneous Q12H  . DISCONTD: gabapentin  100 mg Oral TID  . DISCONTD: gabapentin  200 mg Oral TID  . DISCONTD: pravastatin  20 mg Oral q1800   Continuous Infusions:   . sodium chloride 50 mL/hr at 12/19/11 0844   PRN Meds:.acetaminophen, nitroGLYCERIN, ondansetron (ZOFRAN) IV  Assessment/Plan:  Principal Problem:  *  Chest pain at rest  - pt denies chest pain today  - this is followed and managed by cardiology service   Active Problems:  Lower and upper extremity numbness and tingling  - review of records indicate that this is chronic problem for the pt as he has presented to ED since 2010 with same concern which is now getting worse  - certainly does not appear to be of an acute nature  - I am questioning diabetic neuropathy as he [place] describing "glove and stocking" distribution of symptoms  - diabetes appears to be well controlled but it is still possible to have neuropathy even in the setting of well controlled diabetes  - will increase neurontin 200 mg TID and readjust the medication regimen as indicated in outpatient setting  DM (diabetes mellitus)  - well controlled with A1C 6.0  - continue same medication regimen   HTN (hypertension)  - appears to be well controlled during the hospitalization   Dyslipidemia  - continue statin   EDUCATION  - test results and diagnostic studies were discussed with patient  - patien verbalized the understanding  - questions were answered at the bedside and contact information was provided for additional questions or concerns   LOS: 2 days    MAGICK-Shelitha Magley 12/20/2011, 1:43 PM  [facility] Pager: [phone]

## 2011-12-21 DIAGNOSIS — E119 Type 2 diabetes mellitus without complications: Secondary | ICD-10-CM | POA: Diagnosis not present

## 2011-12-21 DIAGNOSIS — H5440 Blindness, one eye, unspecified eye: Secondary | ICD-10-CM | POA: Diagnosis not present

## 2011-12-21 DIAGNOSIS — R079 Chest pain, unspecified: Secondary | ICD-10-CM | POA: Diagnosis not present

## 2011-12-21 DIAGNOSIS — I2510 Atherosclerotic heart disease of native coronary artery without angina pectoris: Secondary | ICD-10-CM | POA: Diagnosis not present

## 2011-12-21 DIAGNOSIS — R0789 Other chest pain: Secondary | ICD-10-CM | POA: Diagnosis not present

## 2011-12-21 DIAGNOSIS — K5900 Constipation, unspecified: Secondary | ICD-10-CM | POA: Diagnosis not present

## 2011-12-21 DIAGNOSIS — K5904 Chronic idiopathic constipation: Secondary | ICD-10-CM | POA: Diagnosis present

## 2011-12-21 DIAGNOSIS — R209 Unspecified disturbances of skin sensation: Secondary | ICD-10-CM | POA: Diagnosis not present

## 2011-12-21 DIAGNOSIS — K5909 Other constipation: Secondary | ICD-10-CM | POA: Diagnosis present

## 2011-12-21 DIAGNOSIS — I10 Essential (primary) hypertension: Secondary | ICD-10-CM | POA: Diagnosis not present

## 2011-12-21 LAB — GLUCOSE, CAPILLARY
Glucose-Capillary: 107 mg/dL — ABNORMAL HIGH (ref 70–99)
Glucose-Capillary: 103 mg/dL — ABNORMAL HIGH (ref 70–99)
Glucose-Capillary: 102 mg/dL — ABNORMAL HIGH (ref 70–99)

## 2011-12-21 MED ORDER — METOPROLOL TARTRATE 12.5 MG HALF TABLET: 12.5000 mg | ORAL_TABLET | Freq: Two times a day (BID) | ORAL | Status: DC

## 2011-12-21 MED ORDER — GABAPENTIN 300 MG PO CAPS: 300.0000 mg | ORAL_CAPSULE | Freq: Three times a day (TID) | ORAL | Status: AC

## 2011-12-21 MED ORDER — POLYETHYLENE GLYCOL 3350 17 G PO PACK: 17.0000 g | PACK | Freq: Two times a day (BID) | ORAL | Status: AC

## 2011-12-21 MED ORDER — NITROGLYCERIN 0.4 MG SL SUBL: 0.4000 mg | SUBLINGUAL_TABLET | SUBLINGUAL | Status: AC | PRN

## 2011-12-21 MED ORDER — MAGNESIUM CITRATE PO SOLN
300.0000 mL | Freq: Once | ORAL | Status: AC
  Administered 2011-12-21: 300 mL via ORAL
  Filled 2011-12-21: qty 592

## 2011-12-21 MED ORDER — BISACODYL 10 MG RE SUPP: 10.0000 mg | Freq: Every day | RECTAL | Status: DC | PRN

## 2011-12-21 MED ORDER — FLEET ENEMA 7-19 GM/118ML RE ENEM
1.0000 | ENEMA | Freq: Once | RECTAL | Status: DC
  Filled 2011-12-21: qty 1

## 2011-12-21 MED ORDER — ISOSORBIDE MONONITRATE ER 30 MG PO TB24: 30.0000 mg | ORAL_TABLET | Freq: Every day | ORAL | Status: DC

## 2011-12-21 MED ORDER — ACETAMINOPHEN 325 MG PO TABS: 650.0000 mg | ORAL_TABLET | ORAL | Status: AC | PRN

## 2011-12-21 MED ORDER — GABAPENTIN 300 MG PO CAPS
300.0000 mg | ORAL_CAPSULE | Freq: Three times a day (TID) | ORAL | Status: DC
  Administered 2011-12-21 (×2): 300 mg via ORAL
  Filled 2011-12-21 (×3): qty 1

## 2011-12-21 NOTE — Discharge Summary (Signed)
Patient ID: Steven [name],  MRN: [id number], DOB/AGE: 05-28-1956 56 y.o.  Admit date: 12/18/2011 Discharge date: 12/21/2011  Primary Care Provider:  Primary Cardiologist: Dr Royann Shivers  Discharge Diagnoses  Principal Problem:  *Chest pain at rest, low risk Myoview this admission  Active Problems:  CAD known LAD stent placed in 2007 in [place] ,last cath 2010 with patent stent and nonobstructive CAD  DM (diabetes mellitus), with neuropathy  HTN (hypertension)  Dyslipidemia  Blindness, legal, s/p transplant  Chronic constipation    Procedures: [facility] Course:HPI: 56 year old with a history of coronary artery disease with a stent to the LAD in 2007 in [place]. He had his last heart catheterization 2010 after presenting with chest pain was 10 out of 10 in intensity he underwent cardiac catheterization and was found to have patent LAD stent there. He did have in stent restenosis of about 30%, 20-30% proximally of the left circumflex and right coronary artery.LV function was 65%. Patient states 3-5 days prior to admission he started having chest pain across his chest and that down both legs he had numbness and tingling and burning. Also, he is mildly short of breath with  no nausea. Only once did he awakened from sleep with the discomfort and diaphoresis. He has not had to change his activity at all with the pain as exertion does not increase the pain. The patient was admitted to telemetry and ruled out for an MI. He had a [place] Myoview 12/19/11 which was low risk. His EF was 53%. He continued to have multiple complaints if pain, numbness, and tingling in his extremities. He was seen in consult by Dr Danie Binder (internal medicine) and put on Neurontin. He had problems with constipation while in the hospital, this is a chronic issue. Dr Royann Shivers feels he can discharged 12/21/11. The pt actually has an annual appointment with Dr Royann Shivers and he will keep this.   Discharge Vitals:    Blood pressure 98/62, pulse 61, temperature 98.5 F (36.9 C), temperature source Oral, resp. rate 20, height 5\' 8"  (1.727 m), weight 74.98 kg (165 lb 4.8 oz), SpO2 100.00%.    Labs: Results for orders placed during the hospital encounter of 12/18/11 (from the past 48 hour(s))  GLUCOSE, CAPILLARY     Status: Normal   Collection Time   12/19/11  4:44 PM      Component Value Range Comment   Glucose-Capillary 99  70 - 99 (mg/dL)    Comment 1 Notify RN     GLUCOSE, CAPILLARY     Status: Abnormal   Collection Time   12/19/11  8:44 PM      Component Value Range Comment   Glucose-Capillary 115 (*) 70 - 99 (mg/dL)   GLUCOSE, CAPILLARY     Status: Normal   Collection Time   12/20/11  8:06 AM      Component Value Range Comment   Glucose-Capillary 96  70 - 99 (mg/dL)    Comment 1 Notify RN     GLUCOSE, CAPILLARY     Status: Abnormal   Collection Time   12/20/11 11:13 AM      Component Value Range Comment   Glucose-Capillary 123 (*) 70 - 99 (mg/dL)   GLUCOSE, CAPILLARY     Status: Normal   Collection Time   12/20/11  4:40 PM      Component Value Range Comment   Glucose-Capillary 99  70 - 99 (mg/dL)   GLUCOSE, CAPILLARY  Status: Abnormal   Collection Time   12/20/11  8:18 PM      Component Value Range Comment   Glucose-Capillary 100 (*) 70 - 99 (mg/dL)   GLUCOSE, CAPILLARY     Status: Abnormal   Collection Time   12/21/11  7:37 AM      Component Value Range Comment   Glucose-Capillary 107 (*) 70 - 99 (mg/dL)    Comment 1 Notify RN     GLUCOSE, CAPILLARY     Status: Abnormal   Collection Time   12/21/11 11:43 AM      Component Value Range Comment   Glucose-Capillary 103 (*) 70 - 99 (mg/dL)     Disposition:  Follow-up Information    Follow up with Tonny Bollman, MD. Call in 2 weeks.      Follow up with INGOLD,LAURA R, NP on 01/13/2012. (2:00pm)    Contact information:   [address] [place] [address] [phone]          Discharge Medications:   Medication List  As of 12/21/2011  3:45 PM   TAKE these medications         acetaminophen 325 MG tablet   Commonly known as: TYLENOL   Take 2 tablets (650 mg total) by mouth every 4 (four) hours as needed.      aspirin EC 81 MG tablet   Take 81 mg by mouth daily.      cycloSPORINE 25 MG capsule   Commonly known as: SANDIMMUNE   Take 75 mg by mouth 2 (two) times daily.      folic acid 1 MG tablet   Commonly known as: FOLVITE   Take 1 mg by mouth daily.      gabapentin 300 MG capsule   Commonly known as: NEURONTIN   Take 1 capsule (300 mg total) by mouth 3 (three) times daily.      isosorbide mononitrate 30 MG 24 hr tablet   Commonly known as: IMDUR   Take 1 tablet (30 mg total) by mouth daily.      lisinopril 10 MG tablet   Commonly known as: PRINIVIL,ZESTRIL   Take 10 mg by mouth daily.      methotrexate 2.5 MG tablet   Commonly known as: RHEUMATREX   Take 12.5 mg by mouth once a week. Fridays. Caution:Chemotherapy. Protect from light.      metoprolol tartrate 12.5 mg Tabs   Commonly known as: LOPRESSOR   Take 0.5 tablets (12.5 mg total) by mouth 2 (two) times daily.      nitroGLYCERIN 0.4 MG SL tablet   Commonly known as: NITROSTAT   Place 1 tablet (0.4 mg total) under the tongue every 5 (five) minutes x 3 doses as needed for chest pain.      polyethylene glycol packet   Commonly known as: MIRALAX / GLYCOLAX   Take 17 g by mouth 2 (two) times daily.      pravastatin 40 MG tablet   Commonly known as: PRAVACHOL   Take 40 mg by mouth daily.      prednisoLONE acetate 1 % ophthalmic suspension   Commonly known as: PRED FORTE   Place 1 drop into the left eye daily.            Outstanding Labs/Studies  Duration of Discharge Encounter: Greater than 30 minutes including physician time.  Jolene Provost PA-C 12/21/2011 3:45 PM

## 2011-12-21 NOTE — Plan of Care (Signed)
Problem: Problem: Bowel/Bladder Progression Goal: NO CONSTIPATION Outcome: Not Progressing No results from miralax.  Magnesium Citrate ordered and given.

## 2011-12-21 NOTE — Progress Notes (Signed)
Patient ID: Steven [name], male   DOB: 07/14/1956, 56 y.o.   MRN: [id number]  Subjective: No events overnight. Patient denies chest pain, shortness of breath, abdominal pain. Reports improvement in hands/feet numbness and tingling but not entirely resolved.  Objective:  Vital signs in last 24 hours:  Filed Vitals:   12/20/11 1309 12/20/11 1350 12/20/11 2100 12/21/11 0500  BP: 114/67  101/54 113/60  Pulse: 63  59 74  Temp: 98.2 F (36.8 C)  98 F (36.7 C) 97.9 F (36.6 C)  TempSrc: Oral  Oral Oral  Resp: 18  20 20   Height:      Weight:  74.98 kg (165 lb 4.8 oz)    SpO2: 99%  99% 100%    Intake/Output from previous day:  No intake or output data in the 24 hours ending 12/21/11 0954  Physical Exam: General: Alert, awake, oriented x3, in no acute distress. HEENT: No bruits, no goiter. Moist mucous membranes, no scleral icterus, no conjunctival pallor. Heart: Regular rate and rhythm, S1/S2 +, no murmurs, rubs, gallops. Lungs: Clear to auscultation bilaterally. No wheezing, no rhonchi, no rales.  Abdomen: Soft, nontender, nondistended, positive bowel sounds. Extremities: No clubbing or cyanosis, no pitting edema,  positive pedal pulses. Neuro: Grossly nonfocal.  Lab Results:  Basic Metabolic Panel:    Component Value Date/Time   NA 138 12/19/2011 0138   K 4.3 12/19/2011 0138   CL 102 12/19/2011 0138   CO2 27 12/19/2011 0138   BUN 17 12/19/2011 0138   CREATININE 1.18 12/19/2011 0138   GLUCOSE 110* 12/19/2011 0138   CALCIUM 9.7 12/19/2011 0138   CBC:    Component Value Date/Time   WBC 6.3 12/19/2011 0138   HGB 14.6 12/19/2011 0138   HCT 41.5 12/19/2011 0138   PLT 190 12/19/2011 0138   MCV 91.6 12/19/2011 0138   NEUTROABS 2.6 12/18/2011 1400   LYMPHSABS 1.7 12/18/2011 1400   MONOABS 0.3 12/18/2011 1400   EOSABS 0.0 12/18/2011 1400   BASOSABS 0.0 12/18/2011 1400      Lab 12/19/11 0138 12/18/11 1400  WBC 6.3 4.6  HGB 14.6 16.0  HCT 41.5 45.5  PLT 190 180  MCV 91.6 91.7  MCH  32.2 32.3  MCHC 35.2 35.2  RDW 13.9 13.9  LYMPHSABS -- 1.7  MONOABS -- 0.3  EOSABS -- 0.0  BASOSABS -- 0.0  BANDABS -- --    Lab 12/19/11 0138 12/18/11 2013 12/18/11 1400  NA 138 -- 137  K 4.3 -- 4.8  CL 102 -- 102  CO2 27 -- 28  GLUCOSE 110* -- 103*  BUN 17 -- 13  CREATININE 1.18 -- 1.25  CALCIUM 9.7 -- 10.2  MG -- 1.8 --    Lab 12/18/11 1400  INR 1.01  PROTIME --   Cardiac markers:  Lab 12/19/11 1144 12/19/11 0138 12/18/11 2013  CKMB 1.1 1.1 1.2  TROPONINI <0.30 <0.30 <0.30  MYOGLOBIN -- -- --   No components found with this basename: POCBNP:3 No results found for this or any previous visit (from the past 240 hour(s)).  Studies/Results: Dg Abd 1 View  12/20/2011  *RADIOLOGY REPORT*  Clinical Data: Abdominal pain.  Constipation.  ABDOMEN - 1 VIEW  Comparison: 03/04/2009  Findings: There is air and stool scattered throughout the nondistended colon.  No small bowel dilatation.  Osseous structures are normal.  No abnormal calcifications.  IMPRESSION: Benign-appearing abdomen.  Original Report Authenticated By: Gwynn Burly, M.D.   Nm Myocar Multi W/spect W/wall Motion /  Ef  12/19/2011  *RADIOLOGY REPORT*  Clinical Data:  Chest pain.  History of coronary artery disease, diabetes, hypertension and hyperlipidemia. Prior LAD coronary stent placement in 2007.  MYOCARDIAL IMAGING WITH SPECT (REST AND PHARMACOLOGIC-STRESS) GATED LEFT VENTRICULAR WALL MOTION STUDY LEFT VENTRICULAR EJECTION FRACTION  Technique:  Standard myocardial SPECT imaging was performed after resting intravenous injection of 10 mCi Tc-69m tetrofosmin. Subsequently, intravenous infusion of Lexiscan was performed under the supervision of the Cardiology staff.  At peak effect of the drug, 30 mCi Tc-59m tetrofosmin was injected intravenously and standard myocardial SPECT  imaging was performed.  Quantitative gated imaging was also performed to evaluate left ventricular wall motion, and estimate left ventricular  ejection fraction.  Comparison:  10/10/2006  Findings: Utilizing gated data, the end-diastolic volume is estimated to be 82 ml and the end-systolic volume 38 ml. Calculated ejection fraction is 53%.  Gated wall motion analysis suggests mild anterior hypokinesis.  However, quantitative ejection fraction is normal.  SPECT imaging shows no evidence of inducible ischemia with Lexiscan administration.  There is some encroachment upon the inferior wall by bowel activity on the stress acquisition.  IMPRESSION: No evidence of inducible myocardial ischemia.  Mild anterior wall hypokinesis with normal quantitative ejection fraction of 53%.  Original Report Authenticated By: Reola Calkins, M.D.    Medications: Scheduled Meds:   . aspirin EC  81 mg Oral Daily  . cycloSPORINE  75 mg Oral BID  . docusate sodium  100 mg Oral BID  . folic acid  1 mg Oral Daily  . gabapentin  200 mg Oral TID  . insulin aspart  0-9 Units Subcutaneous TID WC  . isosorbide mononitrate  30 mg Oral Daily  . lisinopril  10 mg Oral Daily  . methotrexate  12.5 mg Oral Q Fri  . metoprolol tartrate  12.5 mg Oral BID  . polyethylene glycol  17 g Oral BID  . polyethylene glycol  17 g Oral Once  . prednisoLONE acetate  1 drop Left Eye Daily  . DISCONTD: gabapentin  100 mg Oral TID  . DISCONTD: gabapentin  200 mg Oral TID   Continuous Infusions:   . sodium chloride 50 mL/hr at 12/19/11 0844   PRN Meds:.acetaminophen, nitroGLYCERIN, ondansetron (ZOFRAN) IV  Assessment/Plan:  Principal Problem:  *Chest pain at rest  - pt denies chest pain today  - this is followed and managed by cardiology service   Active Problems:  Lower and upper extremity numbness and tingling  - review of records indicate that this is chronic problem for the pt as he has presented to ED since 2010 with same concern which is now getting worse  - certainly does not appear to be of an acute nature  - I am questioning diabetic neuropathy as he [place]  describing "glove and stocking" distribution of symptoms  - diabetes appears to be well controlled but it is still possible to have neuropathy even in the setting of well controlled diabetes  - will increase neurontin 300 mg TID 3  DM (diabetes mellitus)  - well controlled with A1C 6.0  - continue same medication regimen   HTN (hypertension)  - appears to be well controlled during the hospitalization   Dyslipidemia  - continue statin   EDUCATION  - test results and diagnostic studies were discussed with patient  - patien verbalized the understanding  - questions were answered at the bedside and contact information was provided for additional questions or concerns  - will sign off -  please call for questions   LOS: 3 days   MAGICK-Neymar Dowe 12/21/2011, 9:54 AM  [facility] Pager: [phone]

## 2011-12-21 NOTE — Progress Notes (Signed)
Patient reports having large bowel movement after Magnesium citrate.  Corine Shelter PA notified. Orders received to discharge patient to home.  Medications to be called in to [facility] per [place] PA.  Colman Cater

## 2011-12-21 NOTE — Discharge Instructions (Signed)
Chest Pain (Nonspecific) It is often hard to give a specific diagnosis for the cause of chest pain. There is always a chance that your pain could be related to something serious, such as a heart attack or a blood clot in the lungs. You need to follow up with your caregiver for further evaluation. CAUSES   Heartburn.   Pneumonia or bronchitis.   Anxiety or stress.   Inflammation around your heart (pericarditis) or lung (pleuritis or pleurisy).   A blood clot in the lung.   A collapsed lung (pneumothorax). It can develop suddenly on its own (spontaneous pneumothorax) or from injury (trauma) to the chest.   Shingles infection (herpes zoster virus).  The chest wall is composed of bones, muscles, and cartilage. Any of these can be the source of the pain.  The bones can be bruised by injury.   The muscles or cartilage can be strained by coughing or overwork.   The cartilage can be affected by inflammation and become sore (costochondritis).  DIAGNOSIS  Lab tests or other studies, such as X-rays, electrocardiography, stress testing, or cardiac imaging, may be needed to find the cause of your pain.  TREATMENT   Treatment depends on what may be causing your chest pain. Treatment may include:   Acid blockers for heartburn.   Anti-inflammatory medicine.   Pain medicine for inflammatory conditions.   Antibiotics if an infection is present.   You may be advised to change lifestyle habits. This includes stopping smoking and avoiding alcohol, caffeine, and chocolate.   You may be advised to keep your head raised (elevated) when sleeping. This reduces the chance of acid going backward from your stomach into your esophagus.   Most of the time, nonspecific chest pain will improve within 2 to 3 days with rest and mild pain medicine.  HOME CARE INSTRUCTIONS   If antibiotics were prescribed, take your antibiotics as directed. Finish them even if you start to feel better.   For the next few  days, avoid physical activities that bring on chest pain. Continue physical activities as directed.   Do not smoke.   Avoid drinking alcohol.   Only take over-the-counter or prescription medicine for pain, discomfort, or fever as directed by your caregiver.   Follow your caregiver's suggestions for further testing if your chest pain does not go away.   Keep any follow-up appointments you made. If you do not go to an appointment, you could develop lasting (chronic) problems with pain. If there is any problem keeping an appointment, you must call to reschedule.  SEEK MEDICAL CARE IF:   You think you are having problems from the medicine you are taking. Read your medicine instructions carefully.   Your chest pain does not go away, even after treatment.   You develop a rash with blisters on your chest.  SEEK IMMEDIATE MEDICAL CARE IF:   You have increased chest pain or pain that spreads to your arm, neck, jaw, back, or abdomen.   You develop shortness of breath, an increasing cough, or you are coughing up blood.   You have severe back or abdominal pain, feel nauseous, or vomit.   You develop severe weakness, fainting, or chills.   You have a fever.  THIS IS AN EMERGENCY. Do not wait to see if the pain will go away. Get medical help at once. Call your local emergency services (911 in [place] Do not drive yourself to the hospital. MAKE SURE YOU:   Understand these instructions.     Will watch your condition.   Will get help right away if you are not doing well or get worse.  Document Released: 07/07/2005 Document Revised: 09/16/2011 Document Reviewed: 05/02/2008 [employer] Patient Information 2012 [employer]. 

## 2011-12-21 NOTE — Progress Notes (Signed)
[facility]  DAILY PROGRESS NOTE   Subjective:  Now essentially asymptomatic, but claims no BM in 6 days. Miralax did not help. No abdominal pain. Normal KUB.  Objective:  Temp:  [97.9 F (36.6 C)-98.5 F (36.9 C)] 98.5 F (36.9 C) (03/12 1419) Pulse Rate:  [59-74] 61  (03/12 1419) Resp:  [20] 20  (03/12 1419) BP: (98-113)/(54-62) 98/62 mmHg (03/12 1419) SpO2:  [99 %-100 %] 100 % (03/12 1419) Weight change:   Intake/Output from previous day: 03/11 0701 - 03/12 0700 In: 240 [P.O.:240] Out: -   Intake/Output from this shift: Total I/O In: 140 [P.O.:140] Out: -   Medications: Current Facility-Administered Medications  Medication Dose Route Frequency Provider Last Rate Last Dose  . 0.9 %  sodium chloride infusion   Intravenous Continuous Nada Boozer, NP 50 mL/hr at 12/19/11 0844    . acetaminophen (TYLENOL) tablet 650 mg  650 mg Oral Q4H PRN Nada Boozer, NP   650 mg at 12/20/11 1610  . aspirin EC tablet 81 mg  81 mg Oral Daily Nada Boozer, NP   81 mg at 12/21/11 1011  . bisacodyl (DULCOLAX) suppository 10 mg  10 mg Rectal Daily PRN Analynn Daum, MD      . cycloSPORINE (SANDIMMUNE) capsule 75 mg  75 mg Oral BID Nada Boozer, NP   75 mg at 12/21/11 1011  . docusate sodium (COLACE) capsule 100 mg  100 mg Oral BID Dwana Melena, PA   100 mg at 12/21/11 1011  . folic acid (FOLVITE) tablet 1 mg  1 mg Oral Daily Nada Boozer, NP   1 mg at 12/21/11 1011  . gabapentin (NEURONTIN) capsule 300 mg  300 mg Oral TID Dorothea Ogle, MD   300 mg at 12/21/11 1011  . insulin aspart (novoLOG) injection 0-9 Units  0-9 Units Subcutaneous TID WC Nada Boozer, NP      . isosorbide mononitrate (IMDUR) 24 hr tablet 30 mg  30 mg Oral Daily Nada Boozer, NP   30 mg at 12/21/11 1011  . lisinopril (PRINIVIL,ZESTRIL) tablet 10 mg  10 mg Oral Daily Nada Boozer, NP   10 mg at 12/21/11 1011  . magnesium citrate solution 300 mL  300 mL Oral Once Dulcinea Kinser, MD      .  methotrexate (RHEUMATREX) tablet 12.5 mg  12.5 mg Oral Q Fri Nada Boozer, NP      . metoprolol tartrate (LOPRESSOR) tablet 12.5 mg  12.5 mg Oral BID Nada Boozer, NP   12.5 mg at 12/21/11 1011  . nitroGLYCERIN (NITROSTAT) SL tablet 0.4 mg  0.4 mg Sublingual Q5 Min x 3 PRN Nada Boozer, NP      . ondansetron [facility]) injection 4 mg  4 mg Intravenous Q6H PRN Nada Boozer, NP      . polyethylene glycol (MIRALAX / GLYCOLAX) packet 17 g  17 g Oral BID Dwana Melena, PA   17 g at 12/21/11 1011  . polyethylene glycol (MIRALAX / GLYCOLAX) packet 17 g  17 g Oral Once Dwana Melena, PA   17 g at 12/20/11 1714  . prednisoLONE acetate (PRED FORTE) 1 % ophthalmic suspension 1 drop  1 drop Left Eye Daily Nada Boozer, NP   1 drop at 12/21/11 1010  . sodium phosphate (FLEET) 7-19 GM/118ML enema 1 enema  1 enema Rectal Once Delshon Blanchfield, MD      . DISCONTD: gabapentin (NEURONTIN) capsule 200 mg  200 mg Oral TID Marykay Lex,  MD   200 mg at 12/20/11 2300    Physical Exam: General appearance: alert and no distress Neck: no adenopathy, no carotid bruit, no JVD, supple, symmetrical, trachea midline and thyroid not enlarged, symmetric, no tenderness/mass/nodules Lungs: clear to auscultation bilaterally Heart: regular rate and rhythm, S1, S2 normal, no murmur, click, rub or gallop Abdomen: soft, non-tender; bowel sounds normal; no masses,  no organomegaly Extremities: extremities normal, atraumatic, no cyanosis or edema Pulses: 2+ and symmetric Skin: Skin color, texture, turgor normal. No rashes or lesions Neurologic: Grossly normal  Lab Results: Results for orders placed during the hospital encounter of 12/18/11 (from the past 48 hour(s))  GLUCOSE, CAPILLARY     Status: Normal   Collection Time   12/19/11  4:44 PM      Component Value Range Comment   Glucose-Capillary 99  70 - 99 (mg/dL)    Comment 1 Notify RN     GLUCOSE, CAPILLARY     Status: Abnormal   Collection Time   12/19/11  8:44 PM       Component Value Range Comment   Glucose-Capillary 115 (*) 70 - 99 (mg/dL)   GLUCOSE, CAPILLARY     Status: Normal   Collection Time   12/20/11  8:06 AM      Component Value Range Comment   Glucose-Capillary 96  70 - 99 (mg/dL)    Comment 1 Notify RN     GLUCOSE, CAPILLARY     Status: Abnormal   Collection Time   12/20/11 11:13 AM      Component Value Range Comment   Glucose-Capillary 123 (*) 70 - 99 (mg/dL)   GLUCOSE, CAPILLARY     Status: Normal   Collection Time   12/20/11  4:40 PM      Component Value Range Comment   Glucose-Capillary 99  70 - 99 (mg/dL)   GLUCOSE, CAPILLARY     Status: Abnormal   Collection Time   12/20/11  8:18 PM      Component Value Range Comment   Glucose-Capillary 100 (*) 70 - 99 (mg/dL)   GLUCOSE, CAPILLARY     Status: Abnormal   Collection Time   12/21/11  7:37 AM      Component Value Range Comment   Glucose-Capillary 107 (*) 70 - 99 (mg/dL)    Comment 1 Notify RN     GLUCOSE, CAPILLARY     Status: Abnormal   Collection Time   12/21/11 11:43 AM      Component Value Range Comment   Glucose-Capillary 103 (*) 70 - 99 (mg/dL)     Imaging: Dg Abd 1 View  12/20/2011  *RADIOLOGY REPORT*  Clinical Data: Abdominal pain.  Constipation.  ABDOMEN - 1 VIEW  Comparison: 03/04/2009  Findings: There is air and stool scattered throughout the nondistended colon.  No small bowel dilatation.  Osseous structures are normal.  No abnormal calcifications.  IMPRESSION: Benign-appearing abdomen.  Original Report Authenticated By: Gwynn Burly, M.D.    Assessment:  1. Principal Problem: 2.  *Chest pain at rest 3. Active Problems: 4.  CAD (coronary artery disease), known LAD stent placed in 2007 in [place] ,last cath 2010 with patent stent and nonobstructive CAD 5.  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  History of corneal transplant, lt. eye with blurred vision 7.  DM (diabetes mellitus) 8.  HTN (hypertension) 9.  Dyslipidemia 10.  Blindness, legal 11.   Plan:  1. Try  dulcolax suppository and Mag citrate, enema if necessary. Otherwise ready  for discharge.  Time Spent Directly with Patient:  Length of Stay:  LOS: 3 days    Steven [name] 12/21/2011, 2:37 PM

## 2011-12-27 DIAGNOSIS — M25519 Pain in unspecified shoulder: Secondary | ICD-10-CM | POA: Diagnosis not present

## 2011-12-27 DIAGNOSIS — Z981 Arthrodesis status: Secondary | ICD-10-CM | POA: Diagnosis not present

## 2011-12-27 DIAGNOSIS — Z09 Encounter for follow-up examination after completed treatment for conditions other than malignant neoplasm: Secondary | ICD-10-CM | POA: Diagnosis not present

## 2011-12-27 DIAGNOSIS — M542 Cervicalgia: Secondary | ICD-10-CM | POA: Diagnosis not present

## 2011-12-27 DIAGNOSIS — M4712 Other spondylosis with myelopathy, cervical region: Secondary | ICD-10-CM | POA: Diagnosis not present

## 2011-12-31 DIAGNOSIS — I10 Essential (primary) hypertension: Secondary | ICD-10-CM | POA: Diagnosis not present

## 2011-12-31 DIAGNOSIS — I2510 Atherosclerotic heart disease of native coronary artery without angina pectoris: Secondary | ICD-10-CM | POA: Diagnosis not present

## 2011-12-31 DIAGNOSIS — E782 Mixed hyperlipidemia: Secondary | ICD-10-CM | POA: Diagnosis not present

## 2012-01-11 DIAGNOSIS — H44119 Panuveitis, unspecified eye: Secondary | ICD-10-CM | POA: Diagnosis not present

## 2012-01-11 DIAGNOSIS — D899 Disorder involving the immune mechanism, unspecified: Secondary | ICD-10-CM | POA: Diagnosis not present

## 2012-01-11 DIAGNOSIS — D849 Immunodeficiency, unspecified: Secondary | ICD-10-CM | POA: Insufficient documentation

## 2012-01-11 DIAGNOSIS — D869 Sarcoidosis, unspecified: Secondary | ICD-10-CM | POA: Diagnosis not present

## 2012-01-19 DIAGNOSIS — R079 Chest pain, unspecified: Secondary | ICD-10-CM | POA: Diagnosis not present

## 2012-01-19 DIAGNOSIS — E1129 Type 2 diabetes mellitus with other diabetic kidney complication: Secondary | ICD-10-CM | POA: Diagnosis not present

## 2012-01-19 DIAGNOSIS — E1142 Type 2 diabetes mellitus with diabetic polyneuropathy: Secondary | ICD-10-CM | POA: Diagnosis not present

## 2012-01-19 DIAGNOSIS — N182 Chronic kidney disease, stage 2 (mild): Secondary | ICD-10-CM | POA: Diagnosis not present

## 2012-01-19 DIAGNOSIS — E1149 Type 2 diabetes mellitus with other diabetic neurological complication: Secondary | ICD-10-CM | POA: Diagnosis not present

## 2012-01-19 DIAGNOSIS — E782 Mixed hyperlipidemia: Secondary | ICD-10-CM | POA: Diagnosis not present

## 2012-01-19 DIAGNOSIS — E8881 Metabolic syndrome: Secondary | ICD-10-CM | POA: Diagnosis not present

## 2012-01-19 DIAGNOSIS — E7800 Pure hypercholesterolemia, unspecified: Secondary | ICD-10-CM | POA: Diagnosis not present

## 2012-01-19 DIAGNOSIS — Z79899 Other long term (current) drug therapy: Secondary | ICD-10-CM | POA: Diagnosis not present

## 2012-02-10 DIAGNOSIS — K625 Hemorrhage of anus and rectum: Secondary | ICD-10-CM | POA: Diagnosis not present

## 2012-02-10 DIAGNOSIS — R634 Abnormal weight loss: Secondary | ICD-10-CM | POA: Diagnosis not present

## 2012-02-10 DIAGNOSIS — K5900 Constipation, unspecified: Secondary | ICD-10-CM | POA: Diagnosis not present

## 2012-02-16 DIAGNOSIS — E1149 Type 2 diabetes mellitus with other diabetic neurological complication: Secondary | ICD-10-CM | POA: Diagnosis not present

## 2012-02-16 DIAGNOSIS — Z79899 Other long term (current) drug therapy: Secondary | ICD-10-CM | POA: Diagnosis not present

## 2012-02-16 DIAGNOSIS — E291 Testicular hypofunction: Secondary | ICD-10-CM | POA: Diagnosis not present

## 2012-02-16 DIAGNOSIS — E1142 Type 2 diabetes mellitus with diabetic polyneuropathy: Secondary | ICD-10-CM | POA: Diagnosis not present

## 2012-03-08 DIAGNOSIS — R634 Abnormal weight loss: Secondary | ICD-10-CM | POA: Diagnosis not present

## 2012-03-08 DIAGNOSIS — Z1211 Encounter for screening for malignant neoplasm of colon: Secondary | ICD-10-CM | POA: Diagnosis not present

## 2012-03-08 DIAGNOSIS — K625 Hemorrhage of anus and rectum: Secondary | ICD-10-CM | POA: Diagnosis not present

## 2012-03-08 DIAGNOSIS — K5900 Constipation, unspecified: Secondary | ICD-10-CM | POA: Diagnosis not present

## 2012-03-23 DIAGNOSIS — K648 Other hemorrhoids: Secondary | ICD-10-CM | POA: Diagnosis not present

## 2012-03-23 DIAGNOSIS — K5900 Constipation, unspecified: Secondary | ICD-10-CM | POA: Diagnosis not present

## 2012-03-28 DIAGNOSIS — H44119 Panuveitis, unspecified eye: Secondary | ICD-10-CM | POA: Diagnosis not present

## 2012-03-28 DIAGNOSIS — H35359 Cystoid macular degeneration, unspecified eye: Secondary | ICD-10-CM | POA: Diagnosis not present

## 2012-03-28 DIAGNOSIS — D899 Disorder involving the immune mechanism, unspecified: Secondary | ICD-10-CM | POA: Diagnosis not present

## 2012-03-28 DIAGNOSIS — D869 Sarcoidosis, unspecified: Secondary | ICD-10-CM | POA: Diagnosis not present

## 2012-03-29 DIAGNOSIS — H44119 Panuveitis, unspecified eye: Secondary | ICD-10-CM | POA: Diagnosis not present

## 2012-03-29 DIAGNOSIS — H35359 Cystoid macular degeneration, unspecified eye: Secondary | ICD-10-CM | POA: Diagnosis not present

## 2012-03-29 DIAGNOSIS — T85398A Other mechanical complication of other ocular prosthetic devices, implants and grafts, initial encounter: Secondary | ICD-10-CM | POA: Diagnosis not present

## 2012-04-19 DIAGNOSIS — T85398A Other mechanical complication of other ocular prosthetic devices, implants and grafts, initial encounter: Secondary | ICD-10-CM | POA: Diagnosis not present

## 2012-04-19 DIAGNOSIS — H35359 Cystoid macular degeneration, unspecified eye: Secondary | ICD-10-CM | POA: Diagnosis not present

## 2012-04-19 DIAGNOSIS — H44119 Panuveitis, unspecified eye: Secondary | ICD-10-CM | POA: Diagnosis not present

## 2012-05-24 DIAGNOSIS — T85398A Other mechanical complication of other ocular prosthetic devices, implants and grafts, initial encounter: Secondary | ICD-10-CM | POA: Diagnosis not present

## 2012-06-26 DIAGNOSIS — E1129 Type 2 diabetes mellitus with other diabetic kidney complication: Secondary | ICD-10-CM | POA: Diagnosis not present

## 2012-06-26 DIAGNOSIS — N181 Chronic kidney disease, stage 1: Secondary | ICD-10-CM | POA: Diagnosis not present

## 2012-06-26 DIAGNOSIS — Z79899 Other long term (current) drug therapy: Secondary | ICD-10-CM | POA: Diagnosis not present

## 2012-06-26 DIAGNOSIS — E1149 Type 2 diabetes mellitus with other diabetic neurological complication: Secondary | ICD-10-CM | POA: Diagnosis not present

## 2012-06-26 DIAGNOSIS — I129 Hypertensive chronic kidney disease with stage 1 through stage 4 chronic kidney disease, or unspecified chronic kidney disease: Secondary | ICD-10-CM | POA: Diagnosis not present

## 2012-06-26 DIAGNOSIS — Z0000 Encounter for general adult medical examination without abnormal findings: Secondary | ICD-10-CM | POA: Diagnosis not present

## 2012-06-26 DIAGNOSIS — I2510 Atherosclerotic heart disease of native coronary artery without angina pectoris: Secondary | ICD-10-CM | POA: Diagnosis not present

## 2012-06-26 DIAGNOSIS — M2550 Pain in unspecified joint: Secondary | ICD-10-CM | POA: Diagnosis not present

## 2012-06-26 DIAGNOSIS — N182 Chronic kidney disease, stage 2 (mild): Secondary | ICD-10-CM | POA: Diagnosis not present

## 2012-07-06 DIAGNOSIS — H44119 Panuveitis, unspecified eye: Secondary | ICD-10-CM | POA: Diagnosis not present

## 2012-07-06 DIAGNOSIS — T85398A Other mechanical complication of other ocular prosthetic devices, implants and grafts, initial encounter: Secondary | ICD-10-CM | POA: Diagnosis not present

## 2012-07-06 DIAGNOSIS — I10 Essential (primary) hypertension: Secondary | ICD-10-CM | POA: Diagnosis not present

## 2012-07-06 DIAGNOSIS — Z87891 Personal history of nicotine dependence: Secondary | ICD-10-CM | POA: Diagnosis not present

## 2012-07-06 DIAGNOSIS — Z79899 Other long term (current) drug therapy: Secondary | ICD-10-CM | POA: Diagnosis not present

## 2012-07-06 DIAGNOSIS — E119 Type 2 diabetes mellitus without complications: Secondary | ICD-10-CM | POA: Diagnosis not present

## 2012-07-06 DIAGNOSIS — D869 Sarcoidosis, unspecified: Secondary | ICD-10-CM | POA: Diagnosis not present

## 2012-07-06 DIAGNOSIS — I2510 Atherosclerotic heart disease of native coronary artery without angina pectoris: Secondary | ICD-10-CM | POA: Diagnosis not present

## 2012-07-06 DIAGNOSIS — H35359 Cystoid macular degeneration, unspecified eye: Secondary | ICD-10-CM | POA: Diagnosis not present

## 2012-07-07 DIAGNOSIS — H44119 Panuveitis, unspecified eye: Secondary | ICD-10-CM | POA: Diagnosis not present

## 2012-07-07 DIAGNOSIS — E119 Type 2 diabetes mellitus without complications: Secondary | ICD-10-CM | POA: Diagnosis not present

## 2012-07-07 DIAGNOSIS — T85398A Other mechanical complication of other ocular prosthetic devices, implants and grafts, initial encounter: Secondary | ICD-10-CM | POA: Diagnosis not present

## 2012-07-07 DIAGNOSIS — Z79899 Other long term (current) drug therapy: Secondary | ICD-10-CM | POA: Diagnosis not present

## 2012-07-07 DIAGNOSIS — I2510 Atherosclerotic heart disease of native coronary artery without angina pectoris: Secondary | ICD-10-CM | POA: Diagnosis not present

## 2012-07-07 DIAGNOSIS — I10 Essential (primary) hypertension: Secondary | ICD-10-CM | POA: Diagnosis not present

## 2012-07-26 DIAGNOSIS — Z79899 Other long term (current) drug therapy: Secondary | ICD-10-CM | POA: Diagnosis not present

## 2012-07-26 DIAGNOSIS — Z833 Family history of diabetes mellitus: Secondary | ICD-10-CM | POA: Diagnosis not present

## 2012-07-26 DIAGNOSIS — E1149 Type 2 diabetes mellitus with other diabetic neurological complication: Secondary | ICD-10-CM | POA: Diagnosis not present

## 2012-07-26 DIAGNOSIS — E1142 Type 2 diabetes mellitus with diabetic polyneuropathy: Secondary | ICD-10-CM | POA: Diagnosis not present

## 2012-07-28 DIAGNOSIS — N1330 Unspecified hydronephrosis: Secondary | ICD-10-CM | POA: Diagnosis not present

## 2012-07-28 DIAGNOSIS — N200 Calculus of kidney: Secondary | ICD-10-CM | POA: Diagnosis not present

## 2012-08-03 DIAGNOSIS — N1330 Unspecified hydronephrosis: Secondary | ICD-10-CM | POA: Diagnosis not present

## 2012-08-03 DIAGNOSIS — N401 Enlarged prostate with lower urinary tract symptoms: Secondary | ICD-10-CM | POA: Diagnosis not present

## 2012-08-03 DIAGNOSIS — N134 Hydroureter: Secondary | ICD-10-CM | POA: Diagnosis not present

## 2012-08-10 DIAGNOSIS — N1330 Unspecified hydronephrosis: Secondary | ICD-10-CM | POA: Diagnosis not present

## 2012-08-10 DIAGNOSIS — N401 Enlarged prostate with lower urinary tract symptoms: Secondary | ICD-10-CM | POA: Diagnosis not present

## 2012-08-22 DIAGNOSIS — H44119 Panuveitis, unspecified eye: Secondary | ICD-10-CM | POA: Diagnosis not present

## 2012-08-22 DIAGNOSIS — D869 Sarcoidosis, unspecified: Secondary | ICD-10-CM | POA: Diagnosis not present

## 2012-08-22 DIAGNOSIS — H35359 Cystoid macular degeneration, unspecified eye: Secondary | ICD-10-CM | POA: Diagnosis not present

## 2012-08-22 DIAGNOSIS — E119 Type 2 diabetes mellitus without complications: Secondary | ICD-10-CM | POA: Diagnosis not present

## 2012-08-22 DIAGNOSIS — I10 Essential (primary) hypertension: Secondary | ICD-10-CM | POA: Diagnosis not present

## 2012-08-22 DIAGNOSIS — D899 Disorder involving the immune mechanism, unspecified: Secondary | ICD-10-CM | POA: Diagnosis not present

## 2012-10-25 DIAGNOSIS — T85398A Other mechanical complication of other ocular prosthetic devices, implants and grafts, initial encounter: Secondary | ICD-10-CM | POA: Diagnosis not present

## 2012-10-25 DIAGNOSIS — Z947 Corneal transplant status: Secondary | ICD-10-CM | POA: Diagnosis not present

## 2012-11-27 DIAGNOSIS — E559 Vitamin D deficiency, unspecified: Secondary | ICD-10-CM | POA: Diagnosis not present

## 2012-11-27 DIAGNOSIS — N182 Chronic kidney disease, stage 2 (mild): Secondary | ICD-10-CM | POA: Diagnosis not present

## 2012-11-27 DIAGNOSIS — I129 Hypertensive chronic kidney disease with stage 1 through stage 4 chronic kidney disease, or unspecified chronic kidney disease: Secondary | ICD-10-CM | POA: Diagnosis not present

## 2012-11-27 DIAGNOSIS — E1149 Type 2 diabetes mellitus with other diabetic neurological complication: Secondary | ICD-10-CM | POA: Diagnosis not present

## 2012-11-27 DIAGNOSIS — E1142 Type 2 diabetes mellitus with diabetic polyneuropathy: Secondary | ICD-10-CM | POA: Diagnosis not present

## 2012-12-12 DIAGNOSIS — I10 Essential (primary) hypertension: Secondary | ICD-10-CM | POA: Diagnosis not present

## 2012-12-12 DIAGNOSIS — R079 Chest pain, unspecified: Secondary | ICD-10-CM | POA: Diagnosis not present

## 2012-12-12 DIAGNOSIS — I2510 Atherosclerotic heart disease of native coronary artery without angina pectoris: Secondary | ICD-10-CM | POA: Diagnosis not present

## 2012-12-13 DIAGNOSIS — Z981 Arthrodesis status: Secondary | ICD-10-CM | POA: Diagnosis not present

## 2012-12-13 DIAGNOSIS — M47812 Spondylosis without myelopathy or radiculopathy, cervical region: Secondary | ICD-10-CM | POA: Diagnosis not present

## 2012-12-18 DIAGNOSIS — R0602 Shortness of breath: Secondary | ICD-10-CM | POA: Diagnosis not present

## 2012-12-18 DIAGNOSIS — R079 Chest pain, unspecified: Secondary | ICD-10-CM | POA: Diagnosis not present

## 2012-12-20 DIAGNOSIS — Z947 Corneal transplant status: Secondary | ICD-10-CM | POA: Diagnosis not present

## 2013-01-03 ENCOUNTER — Other Ambulatory Visit (HOSPITAL_COMMUNITY): Payer: Self-pay | Admitting: Cardiovascular Disease

## 2013-01-03 DIAGNOSIS — R942 Abnormal results of pulmonary function studies: Secondary | ICD-10-CM

## 2013-01-10 DIAGNOSIS — Z4789 Encounter for other orthopedic aftercare: Secondary | ICD-10-CM | POA: Insufficient documentation

## 2013-01-12 DIAGNOSIS — E119 Type 2 diabetes mellitus without complications: Secondary | ICD-10-CM | POA: Diagnosis not present

## 2013-01-12 DIAGNOSIS — Z947 Corneal transplant status: Secondary | ICD-10-CM | POA: Diagnosis not present

## 2013-01-12 DIAGNOSIS — H44119 Panuveitis, unspecified eye: Secondary | ICD-10-CM | POA: Diagnosis not present

## 2013-01-12 DIAGNOSIS — Z981 Arthrodesis status: Secondary | ICD-10-CM | POA: Diagnosis not present

## 2013-01-12 DIAGNOSIS — D869 Sarcoidosis, unspecified: Secondary | ICD-10-CM | POA: Diagnosis not present

## 2013-01-12 DIAGNOSIS — D899 Disorder involving the immune mechanism, unspecified: Secondary | ICD-10-CM | POA: Diagnosis not present

## 2013-01-19 ENCOUNTER — Ambulatory Visit (HOSPITAL_COMMUNITY)
Admission: RE | Admit: 2013-01-19 | Discharge: 2013-01-19 | Disposition: A | Discharge status: Home / Self Care | Payer: Medicare Other | Source: Ambulatory Visit | Attending: Cardiovascular Disease | Admitting: Cardiovascular Disease

## 2013-01-19 DIAGNOSIS — R942 Abnormal results of pulmonary function studies: Secondary | ICD-10-CM

## 2013-01-19 DIAGNOSIS — R079 Chest pain, unspecified: Secondary | ICD-10-CM | POA: Diagnosis not present

## 2013-01-19 DIAGNOSIS — I2510 Atherosclerotic heart disease of native coronary artery without angina pectoris: Secondary | ICD-10-CM | POA: Insufficient documentation

## 2013-01-19 DIAGNOSIS — E119 Type 2 diabetes mellitus without complications: Secondary | ICD-10-CM | POA: Insufficient documentation

## 2013-01-19 DIAGNOSIS — Z87891 Personal history of nicotine dependence: Secondary | ICD-10-CM | POA: Insufficient documentation

## 2013-01-19 DIAGNOSIS — R9430 Abnormal result of cardiovascular function study, unspecified: Secondary | ICD-10-CM | POA: Diagnosis not present

## 2013-01-19 DIAGNOSIS — I10 Essential (primary) hypertension: Secondary | ICD-10-CM | POA: Diagnosis not present

## 2013-01-19 DIAGNOSIS — R0602 Shortness of breath: Secondary | ICD-10-CM | POA: Insufficient documentation

## 2013-01-19 DIAGNOSIS — Z9861 Coronary angioplasty status: Secondary | ICD-10-CM | POA: Diagnosis not present

## 2013-01-19 HISTORY — PX: OTHER SURGICAL HISTORY: SHX169

## 2013-01-19 MED ORDER — TECHNETIUM TC 99M SESTAMIBI GENERIC - CARDIOLITE
31.6000 | Freq: Once | INTRAVENOUS | Status: AC | PRN
  Administered 2013-01-19: 32 via INTRAVENOUS

## 2013-01-19 MED ORDER — TECHNETIUM TC 99M SESTAMIBI GENERIC - CARDIOLITE
10.6000 | Freq: Once | INTRAVENOUS | Status: AC | PRN
  Administered 2013-01-19: 11 via INTRAVENOUS

## 2013-01-19 MED ORDER — REGADENOSON 0.4 MG/5ML IV SOLN
0.4000 mg | Freq: Once | INTRAVENOUS | Status: AC
  Administered 2013-01-19: 0.4 mg via INTRAVENOUS

## 2013-01-19 NOTE — Procedures (Addendum)
 [facility] [address] [place] [address] [place] [address] [phone]  Cardiology Nuclear Med Study  Steven [name] is a 57 y.o. male     MRN : [id number]     DOB: 1956-04-10  Procedure Date: 01/19/2013  Nuclear Med Background Indication for Stress Test:  Stent Patency and PTCA Patency History:  CAD;STENT/PTCA--2008;PTCA--07/29/2009 Cardiac Risk Factors: Family History - CAD, History of Smoking, Hypertension, Lipids and NIDDM  Symptoms:  Chest Pain and SOB   Nuclear Pre-Procedure Caffeine/Decaff Intake:  10:00pm NPO After: 8:00am   IV Site: R Antecubital  IV 0.9% NS with Angio Cath:  22g  Chest Size (in):  44" IV Started by: Emmit Pomfret, RN  Height: 5\' 6"  (1.676 m)  Cup Size: n/a  BMI:  Body mass index is 27.94 kg/(m^2). Weight:  173 lb (78.472 kg)   Tech Comments:  N/A    Nuclear Med Study 1 or 2 day study: 1 day  Stress Test Type:  Lexiscan  Order Authorizing Provider:  Thurmon Fair, MD   Resting Radionuclide: Technetium 40m Sestamibi  Resting Radionuclide Dose: 10.6 mCi   Stress Radionuclide:  Technetium 35m Sestamibi  Stress Radionuclide Dose: 31.6 mCi           Stress Protocol Rest HR: 56 Stress HR: 93  Rest BP: 122/82 Stress BP: 126/61  Exercise Time (min): n/a METS: n/a          Dose of Adenosine (mg):  n/a Dose of Lexiscan: 0.4 mg  Dose of Atropine (mg): n/a Dose of Dobutamine: n/a mcg/kg/min (at max HR)  Stress Test Technologist: Ernestene Mention, CCT Nuclear Technologist: Gonzella Lex, CNMT   Rest Procedure:  Myocardial perfusion imaging was performed at rest 45 minutes following the intravenous administration of Technetium 65m Sestamibi. Stress Procedure:  The patient received IV Lexiscan 0.4 mg over 15-seconds.  Technetium 71m Sestamibi injected at 30-seconds.  There were no significant changes with Lexiscan.  Quantitative spect images were obtained after a 45 minute delay.  Transient Ischemic Dilatation  (Normal <1.22):  0.96 Lung/Heart Ratio (Normal <0.45):  0.27 QGS EDV:  93 ml QGS ESV:  44 ml LV Ejection Fraction: 53%  Signed by  Gonzella Lex, CNMT  PHYSICIAN INTERPRETATION  Rest ECG: NSR with non-specific ST-T wave changes -- J-point elevation in Precordial and Inferior Limb leads  Stress ECG: No significant change from baseline ECG  QPS Raw Data Images:  There is interference from nuclear activity from structures below the diaphragm. This does not affect the ability to read the study. Stress Images:  There is mild apical thinning with normal uptake in other regions. Rest Images:  There is mild apical thinning with normal uptake in other regions. Subtraction (SDS):  There is no evidence of scar or ischemia.   Impression Exercise Capacity:  Lexiscan with no exercise. BP Response:  Normal blood pressure response. Clinical Symptoms:  No significant symptoms noted. ECG Impression:  No significant ECG changes with Lexiscan. LV Wall Motion:  NL LV Function; NL Wall Motion Comparison with Prior Nuclear Study: No significant change from previous study.  No evidence of an anterior defect.  Overall Impression:  Normal stress nuclear study. Low risk stress nuclear study.    Marykay Lex, MD  01/19/2013 6:06 PM

## 2013-02-02 DIAGNOSIS — Z947 Corneal transplant status: Secondary | ICD-10-CM | POA: Diagnosis not present

## 2013-02-07 DIAGNOSIS — N1330 Unspecified hydronephrosis: Secondary | ICD-10-CM | POA: Diagnosis not present

## 2013-02-07 DIAGNOSIS — N401 Enlarged prostate with lower urinary tract symptoms: Secondary | ICD-10-CM | POA: Diagnosis not present

## 2013-03-14 DIAGNOSIS — Z947 Corneal transplant status: Secondary | ICD-10-CM | POA: Diagnosis not present

## 2013-04-05 IMAGING — CR DG ABDOMEN 1V
1 series · 1 of 1 positions shown · non-contrast
Comparison: 03/04/2009

CLINICAL DATA: Abdominal pain.  Constipation.

ABDOMEN - 1 VIEW

[t abdomen supine]
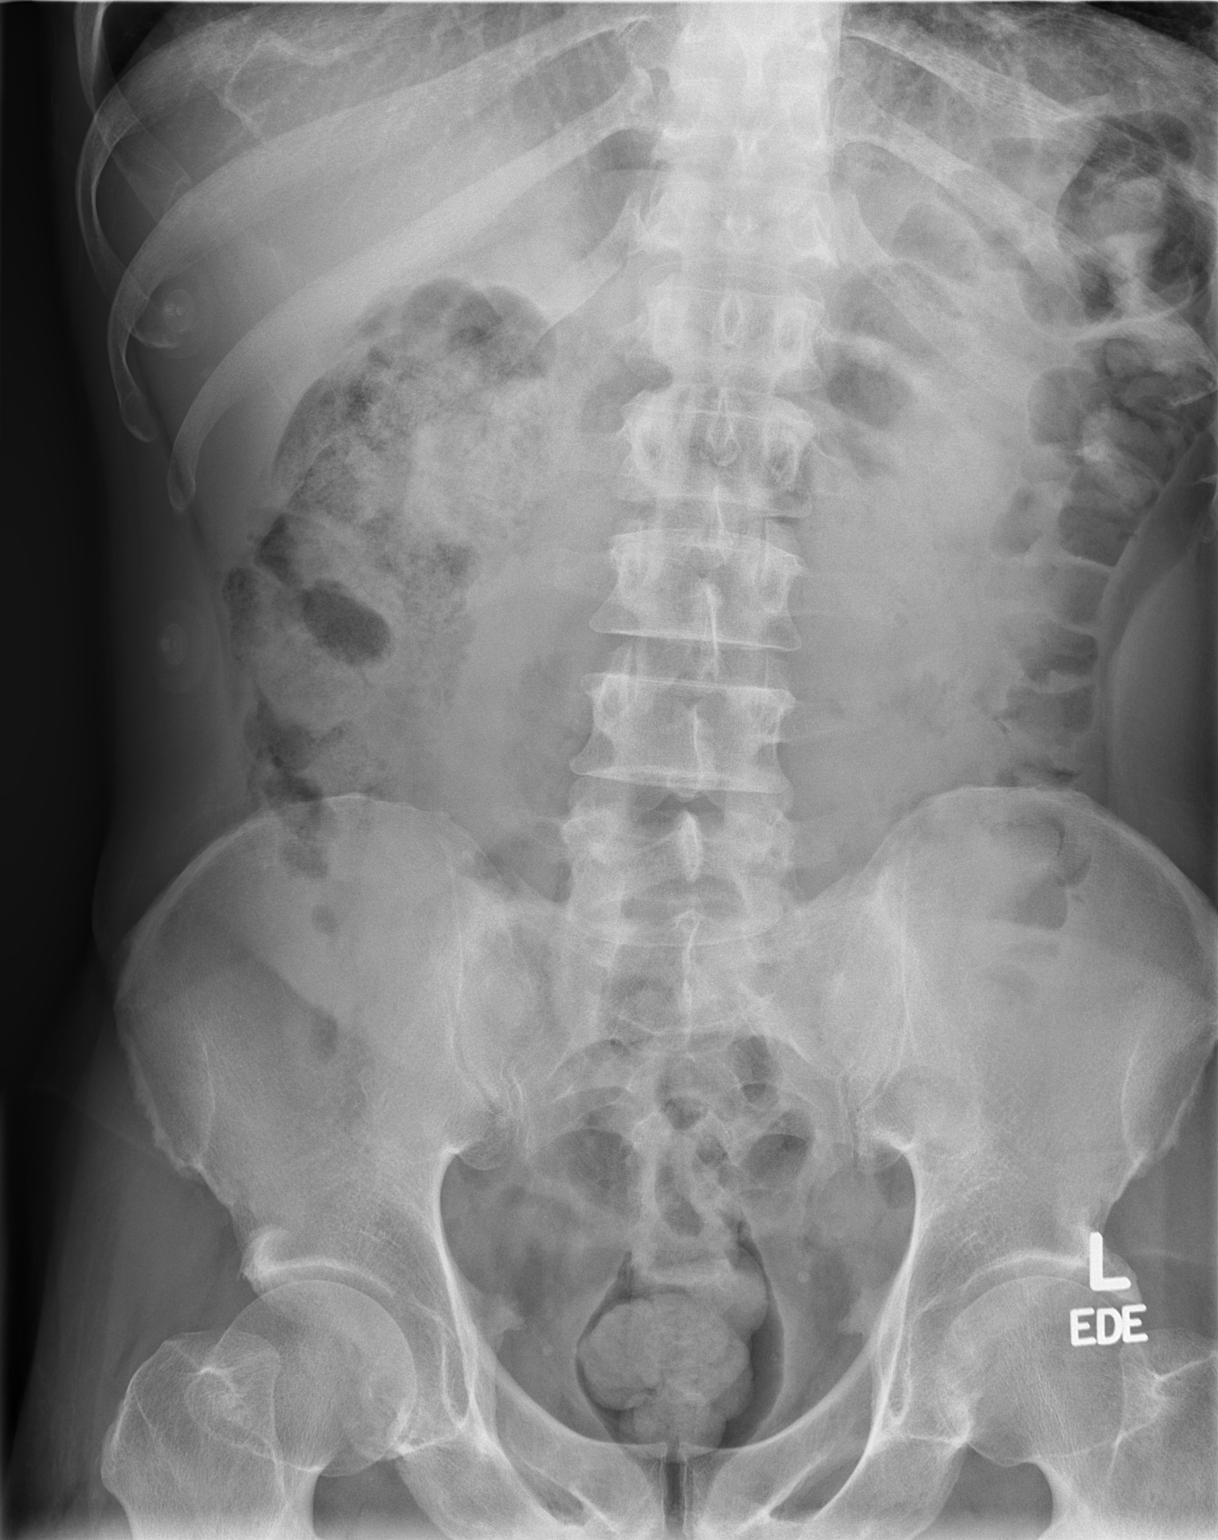

[1 of 1 positions shown; findings below may reference images not displayed]

FINDINGS: There is air and stool scattered throughout the
nondistended colon.  No small bowel dilatation.  Osseous structures
are normal.  No abnormal calcifications.
IMPRESSION: Benign-appearing abdomen.

## 2013-04-18 DIAGNOSIS — Z947 Corneal transplant status: Secondary | ICD-10-CM | POA: Diagnosis not present

## 2013-04-27 DIAGNOSIS — N182 Chronic kidney disease, stage 2 (mild): Secondary | ICD-10-CM | POA: Diagnosis not present

## 2013-04-27 DIAGNOSIS — E119 Type 2 diabetes mellitus without complications: Secondary | ICD-10-CM | POA: Diagnosis not present

## 2013-04-27 DIAGNOSIS — H44119 Panuveitis, unspecified eye: Secondary | ICD-10-CM | POA: Diagnosis not present

## 2013-04-27 DIAGNOSIS — E1142 Type 2 diabetes mellitus with diabetic polyneuropathy: Secondary | ICD-10-CM | POA: Diagnosis not present

## 2013-04-27 DIAGNOSIS — Z79899 Other long term (current) drug therapy: Secondary | ICD-10-CM | POA: Diagnosis not present

## 2013-04-27 DIAGNOSIS — D869 Sarcoidosis, unspecified: Secondary | ICD-10-CM | POA: Diagnosis not present

## 2013-04-27 DIAGNOSIS — E559 Vitamin D deficiency, unspecified: Secondary | ICD-10-CM | POA: Diagnosis not present

## 2013-04-27 DIAGNOSIS — I10 Essential (primary) hypertension: Secondary | ICD-10-CM | POA: Diagnosis not present

## 2013-04-27 DIAGNOSIS — I2510 Atherosclerotic heart disease of native coronary artery without angina pectoris: Secondary | ICD-10-CM | POA: Diagnosis not present

## 2013-04-27 DIAGNOSIS — E1149 Type 2 diabetes mellitus with other diabetic neurological complication: Secondary | ICD-10-CM | POA: Diagnosis not present

## 2013-04-27 DIAGNOSIS — M25579 Pain in unspecified ankle and joints of unspecified foot: Secondary | ICD-10-CM | POA: Diagnosis not present

## 2013-04-27 DIAGNOSIS — I129 Hypertensive chronic kidney disease with stage 1 through stage 4 chronic kidney disease, or unspecified chronic kidney disease: Secondary | ICD-10-CM | POA: Diagnosis not present

## 2013-05-30 DIAGNOSIS — Z947 Corneal transplant status: Secondary | ICD-10-CM | POA: Diagnosis not present

## 2013-07-02 DIAGNOSIS — I129 Hypertensive chronic kidney disease with stage 1 through stage 4 chronic kidney disease, or unspecified chronic kidney disease: Secondary | ICD-10-CM | POA: Diagnosis not present

## 2013-07-02 DIAGNOSIS — N182 Chronic kidney disease, stage 2 (mild): Secondary | ICD-10-CM | POA: Diagnosis not present

## 2013-07-02 DIAGNOSIS — Z0110 Encounter for examination of ears and hearing without abnormal findings: Secondary | ICD-10-CM | POA: Diagnosis not present

## 2013-07-02 DIAGNOSIS — E1149 Type 2 diabetes mellitus with other diabetic neurological complication: Secondary | ICD-10-CM | POA: Diagnosis not present

## 2013-07-02 DIAGNOSIS — I2510 Atherosclerotic heart disease of native coronary artery without angina pectoris: Secondary | ICD-10-CM | POA: Diagnosis not present

## 2013-07-02 DIAGNOSIS — E1049 Type 1 diabetes mellitus with other diabetic neurological complication: Secondary | ICD-10-CM | POA: Diagnosis not present

## 2013-07-02 DIAGNOSIS — Z0000 Encounter for general adult medical examination without abnormal findings: Secondary | ICD-10-CM | POA: Diagnosis not present

## 2013-07-02 DIAGNOSIS — R209 Unspecified disturbances of skin sensation: Secondary | ICD-10-CM | POA: Diagnosis not present

## 2013-07-02 DIAGNOSIS — E1142 Type 2 diabetes mellitus with diabetic polyneuropathy: Secondary | ICD-10-CM | POA: Diagnosis not present

## 2013-07-13 DIAGNOSIS — Z79899 Other long term (current) drug therapy: Secondary | ICD-10-CM | POA: Diagnosis not present

## 2013-07-13 DIAGNOSIS — D899 Disorder involving the immune mechanism, unspecified: Secondary | ICD-10-CM | POA: Diagnosis not present

## 2013-07-13 DIAGNOSIS — I2510 Atherosclerotic heart disease of native coronary artery without angina pectoris: Secondary | ICD-10-CM | POA: Diagnosis not present

## 2013-07-16 DIAGNOSIS — Z48298 Encounter for aftercare following other organ transplant: Secondary | ICD-10-CM | POA: Diagnosis not present

## 2013-07-16 DIAGNOSIS — Z947 Corneal transplant status: Secondary | ICD-10-CM | POA: Diagnosis not present

## 2013-08-09 DIAGNOSIS — N200 Calculus of kidney: Secondary | ICD-10-CM | POA: Diagnosis not present

## 2013-08-21 DIAGNOSIS — K5900 Constipation, unspecified: Secondary | ICD-10-CM | POA: Diagnosis not present

## 2013-10-24 DIAGNOSIS — Z947 Corneal transplant status: Secondary | ICD-10-CM | POA: Diagnosis not present

## 2013-10-24 DIAGNOSIS — H04229 Epiphora due to insufficient drainage, unspecified lacrimal gland: Secondary | ICD-10-CM | POA: Diagnosis not present

## 2013-11-01 DIAGNOSIS — E1129 Type 2 diabetes mellitus with other diabetic kidney complication: Secondary | ICD-10-CM | POA: Diagnosis not present

## 2013-11-01 DIAGNOSIS — M2550 Pain in unspecified joint: Secondary | ICD-10-CM | POA: Diagnosis not present

## 2013-11-01 DIAGNOSIS — M545 Low back pain, unspecified: Secondary | ICD-10-CM | POA: Diagnosis not present

## 2013-11-01 DIAGNOSIS — N182 Chronic kidney disease, stage 2 (mild): Secondary | ICD-10-CM | POA: Diagnosis not present

## 2013-11-13 DIAGNOSIS — Z79899 Other long term (current) drug therapy: Secondary | ICD-10-CM | POA: Diagnosis not present

## 2013-11-13 DIAGNOSIS — H44119 Panuveitis, unspecified eye: Secondary | ICD-10-CM | POA: Diagnosis not present

## 2013-11-13 DIAGNOSIS — D899 Disorder involving the immune mechanism, unspecified: Secondary | ICD-10-CM | POA: Diagnosis not present

## 2013-11-13 DIAGNOSIS — D869 Sarcoidosis, unspecified: Secondary | ICD-10-CM | POA: Diagnosis not present

## 2013-11-28 DIAGNOSIS — I10 Essential (primary) hypertension: Secondary | ICD-10-CM | POA: Diagnosis not present

## 2013-11-28 DIAGNOSIS — E1149 Type 2 diabetes mellitus with other diabetic neurological complication: Secondary | ICD-10-CM | POA: Diagnosis not present

## 2013-11-28 DIAGNOSIS — N182 Chronic kidney disease, stage 2 (mild): Secondary | ICD-10-CM | POA: Diagnosis not present

## 2013-12-13 ENCOUNTER — Encounter: Payer: Self-pay | Admitting: Cardiovascular Disease

## 2013-12-13 ENCOUNTER — Ambulatory Visit (INDEPENDENT_AMBULATORY_CARE_PROVIDER_SITE_OTHER): Payer: Medicare Other | Admitting: Cardiovascular Disease

## 2013-12-13 VITALS — BP 130/78 | HR 62 | Resp 16 | Ht 63.0 in | Wt 175.5 lb

## 2013-12-13 DIAGNOSIS — I2510 Atherosclerotic heart disease of native coronary artery without angina pectoris: Secondary | ICD-10-CM | POA: Diagnosis not present

## 2013-12-13 NOTE — Assessment & Plan Note (Signed)
He is quite active and remains asymptomatic. He is encouraged to keep walking on a daily basis. Weight loss is recommended. We'll get his labs, recently performed by his primary care physician. Followup yearly.

## 2013-12-13 NOTE — Patient Instructions (Signed)
Your physician recommends that you schedule a follow-up appointment in: 1 YEAR. No changes were made in your therapy today.

## 2013-12-13 NOTE — Progress Notes (Signed)
Patient ID: Steven [name], male   DOB: 1955/12/10, 58 y.o.   MRN: [id number]     Reason for office visit CAD  Steven [name] is now 58 years old and 8 years have passed since he received a stent to the LAD artery. Repeat cardiac catheterization in 2010 showed the stents to be patent and no evidence of other obstructive lesions. In March 2014, he had a low-risk myocardial perfusion study.  He has diabetes mellitus and dyslipidemia and hypertension, which are well compensated. 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 His vision is substantially better after the corneal transplant and he is now able to drive again.  He walks daily for about a mile at a time and has no complaints.   No Known Allergies  Current Outpatient Prescriptions  Medication Sig Dispense Refill  . aspirin EC 81 MG tablet Take 81 mg by mouth daily.      . cycloSPORINE (SANDIMMUNE) 25 MG capsule Take 75 mg by mouth 2 (two) times daily.      . folic acid (FOLVITE) 1 MG tablet Take 1 mg by mouth daily.      Steven [name] Kitchen. lisinopril (PRINIVIL,ZESTRIL) 10 MG tablet Take 10 mg by mouth daily.      . methotrexate (RHEUMATREX) 2.5 MG tablet Take 12.5 mg by mouth once a week. Fridays. Caution:Chemotherapy. Protect from light.      . pravastatin (PRAVACHOL) 40 MG tablet Take 40 mg by mouth daily.      . prednisoLONE acetate (PRED FORTE) 1 % ophthalmic suspension Place 1 drop into the left eye daily.      Steven [name] Kitchen. HYDROcodone-acetaminophen (NORCO/VICODIN) 5-325 MG per tablet as needed.      Steven [name] Kitchen. LINZESS 290 MCG CAPS capsule Take 290 mcg by mouth daily.       No current facility-administered medications for this visit.    Past Medical History  Diagnosis Date  . Diabetes mellitus   . Hypertension   . Chest pain at rest 12/18/2011  . CAD (coronary artery disease), known LAD stent placed in 2007 in [place] ,last cath 2010 with patent stent and nonobstructive CAD 12/18/2011  . Glaucoma,  blind in rt. 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 History of corneal transplant, lt. eye with blurred vision 12/18/2011  . DM (diabetes mellitus) 12/18/2011  . HTN (hypertension) 12/18/2011  . Dyslipidemia 12/18/2011  . Blindness, legal 12/18/2011    Past Surgical History  Procedure Laterality Date  . Coronary stent placement    . Cardiac surgery      stent placed in 2007  . Coronary angioplasty    . Corneal transplant      Family History  Problem Relation Age of Onset  . Heart attack Mother     History   Social History  . Marital Status: Divorced    Spouse Name: N/A    Number of Children: N/A  . Years of Education: N/A   Occupational History  . Not on file.   Social History Main Topics  . Smoking status: Former Games developermoker  . Smokeless tobacco: Not on file  . Alcohol Use: No  . Drug Use: Yes    Special: Marijuana  . Sexual Activity:    Other Topics Concern  . Not on file   Social History Narrative  . No narrative on file    Review of systems: The patient specifically denies any chest pain at rest or with exertion, dyspnea at rest  or with exertion, orthopnea, paroxysmal nocturnal dyspnea, syncope, palpitations, focal neurological deficits, intermittent claudication, lower extremity edema, unexplained weight gain, cough, hemoptysis or wheezing.  The patient also denies abdominal pain, nausea, vomiting, dysphagia, diarrhea, constipation, polyuria, polydipsia, dysuria, hematuria, frequency, urgency, abnormal bleeding or bruising, fever, chills, unexpected weight changes, mood swings, change in skin or hair texture, change in voice quality, auditory or visual problems, allergic reactions or rashes, new musculoskeletal complaints other than usual "aches and pains".   PHYSICAL EXAM BP 130/78  Pulse 62  Resp 16  Ht 5\' 3"  (1.6 m)  Wt 79.606 kg (175 lb 8 oz)  BMI 31.10 kg/m2  General: Alert, oriented x3, no distress Head: no evidence of trauma, PERRL, EOMI, no exophtalmos or lid lag, no myxedema,  no xanthelasma; normal ears, nose and oropharynx Neck: normal jugular venous pulsations and no hepatojugular reflux; brisk carotid pulses without delay and no carotid bruits Chest: clear to auscultation, no signs of consolidation by percussion or palpation, normal fremitus, symmetrical and full respiratory excursions Cardiovascular: normal position and quality of the apical impulse, regular rhythm, normal first and second heart sounds, no murmurs, rubs or gallops Abdomen: no tenderness or distention, no masses by palpation, no abnormal pulsatility or arterial bruits, normal bowel sounds, no hepatosplenomegaly Extremities: no clubbing, cyanosis or edema; 2+ radial, ulnar and brachial pulses bilaterally; 2+ right femoral, posterior tibial and dorsalis pedis pulses; 2+ left femoral, posterior tibial and dorsalis pedis pulses; no subclavian or femoral bruits Neurological: grossly nonfocal   EKG: NSR  Lipid Panel     Component Value Date/Time   CHOL 176 12/19/2011 0138   TRIG 59 12/19/2011 0138   HDL 68 12/19/2011 0138   CHOLHDL 2.6 12/19/2011 0138   VLDL 12 12/19/2011 0138   LDLCALC 96 12/19/2011 0138    BMET    Component Value Date/Time   NA 138 12/19/2011 0138   K 4.3 12/19/2011 0138   CL 102 12/19/2011 0138   CO2 27 12/19/2011 0138   GLUCOSE 110* 12/19/2011 0138   BUN 17 12/19/2011 0138   CREATININE 1.18 12/19/2011 0138   CALCIUM 9.7 12/19/2011 0138   GFRNONAA 68* 12/19/2011 0138   GFRAA 79* 12/19/2011 0138     ASSESSMENT AND PLAN CAD s/p LAD stent 2007 He is quite active and remains asymptomatic. He is encouraged to keep walking on a daily basis. Weight loss is recommended. We'll get his labs, recently performed by his primary care physician. Followup yearly.   Patient Instructions  Your physician recommends that you schedule a follow-up appointment in: 1 YEAR. No changes were made in your therapy today.    Orders Placed This Encounter  Procedures  . EKG 12-Lead   Meds ordered  this encounter  Medications  . HYDROcodone-acetaminophen (NORCO/VICODIN) 5-325 MG per tablet    Sig: as needed.  Steven [name] Kitchen LINZESS 290 MCG CAPS capsule    Sig: Take 290 mcg by mouth daily.    Junious Silk, MD, [facility] [phone] office [phone] pager

## 2014-02-27 DIAGNOSIS — Z947 Corneal transplant status: Secondary | ICD-10-CM | POA: Diagnosis not present

## 2014-02-28 DIAGNOSIS — E1129 Type 2 diabetes mellitus with other diabetic kidney complication: Secondary | ICD-10-CM | POA: Diagnosis not present

## 2014-02-28 DIAGNOSIS — N058 Unspecified nephritic syndrome with other morphologic changes: Secondary | ICD-10-CM | POA: Diagnosis not present

## 2014-02-28 DIAGNOSIS — N182 Chronic kidney disease, stage 2 (mild): Secondary | ICD-10-CM | POA: Diagnosis not present

## 2014-02-28 DIAGNOSIS — M79609 Pain in unspecified limb: Secondary | ICD-10-CM | POA: Diagnosis not present

## 2014-02-28 DIAGNOSIS — R413 Other amnesia: Secondary | ICD-10-CM | POA: Diagnosis not present

## 2014-02-28 DIAGNOSIS — E559 Vitamin D deficiency, unspecified: Secondary | ICD-10-CM | POA: Diagnosis not present

## 2014-03-19 DIAGNOSIS — Z947 Corneal transplant status: Secondary | ICD-10-CM | POA: Diagnosis not present

## 2014-03-19 DIAGNOSIS — D869 Sarcoidosis, unspecified: Secondary | ICD-10-CM | POA: Diagnosis not present

## 2014-03-19 DIAGNOSIS — H44119 Panuveitis, unspecified eye: Secondary | ICD-10-CM | POA: Diagnosis not present

## 2014-03-19 DIAGNOSIS — Z79899 Other long term (current) drug therapy: Secondary | ICD-10-CM | POA: Diagnosis not present

## 2014-04-04 DIAGNOSIS — Z87891 Personal history of nicotine dependence: Secondary | ICD-10-CM | POA: Diagnosis not present

## 2014-04-04 DIAGNOSIS — E1149 Type 2 diabetes mellitus with other diabetic neurological complication: Secondary | ICD-10-CM | POA: Diagnosis not present

## 2014-04-04 DIAGNOSIS — E1142 Type 2 diabetes mellitus with diabetic polyneuropathy: Secondary | ICD-10-CM | POA: Diagnosis not present

## 2014-04-29 DIAGNOSIS — E1149 Type 2 diabetes mellitus with other diabetic neurological complication: Secondary | ICD-10-CM | POA: Diagnosis not present

## 2014-04-29 DIAGNOSIS — E1142 Type 2 diabetes mellitus with diabetic polyneuropathy: Secondary | ICD-10-CM | POA: Diagnosis not present

## 2014-04-29 DIAGNOSIS — Z87891 Personal history of nicotine dependence: Secondary | ICD-10-CM | POA: Diagnosis not present

## 2014-07-02 DIAGNOSIS — S335XXA Sprain of ligaments of lumbar spine, initial encounter: Secondary | ICD-10-CM | POA: Diagnosis not present

## 2014-07-02 DIAGNOSIS — M533 Sacrococcygeal disorders, not elsewhere classified: Secondary | ICD-10-CM | POA: Diagnosis not present

## 2014-07-02 DIAGNOSIS — M545 Low back pain, unspecified: Secondary | ICD-10-CM | POA: Diagnosis not present

## 2014-07-02 DIAGNOSIS — M961 Postlaminectomy syndrome, not elsewhere classified: Secondary | ICD-10-CM | POA: Diagnosis not present

## 2014-07-02 DIAGNOSIS — G894 Chronic pain syndrome: Secondary | ICD-10-CM | POA: Diagnosis not present

## 2014-07-02 DIAGNOSIS — G609 Hereditary and idiopathic neuropathy, unspecified: Secondary | ICD-10-CM | POA: Diagnosis not present

## 2014-07-02 DIAGNOSIS — Z79899 Other long term (current) drug therapy: Secondary | ICD-10-CM | POA: Diagnosis not present

## 2014-07-02 DIAGNOSIS — G540 Brachial plexus disorders: Secondary | ICD-10-CM | POA: Diagnosis not present

## 2014-07-02 DIAGNOSIS — M542 Cervicalgia: Secondary | ICD-10-CM | POA: Diagnosis not present

## 2014-07-02 DIAGNOSIS — M5137 Other intervertebral disc degeneration, lumbosacral region: Secondary | ICD-10-CM | POA: Diagnosis not present

## 2014-07-16 DIAGNOSIS — M542 Cervicalgia: Secondary | ICD-10-CM | POA: Diagnosis not present

## 2014-07-16 DIAGNOSIS — G540 Brachial plexus disorders: Secondary | ICD-10-CM | POA: Diagnosis not present

## 2014-07-16 DIAGNOSIS — M961 Postlaminectomy syndrome, not elsewhere classified: Secondary | ICD-10-CM | POA: Diagnosis not present

## 2014-07-23 DIAGNOSIS — D899 Disorder involving the immune mechanism, unspecified: Secondary | ICD-10-CM | POA: Diagnosis not present

## 2014-07-23 DIAGNOSIS — D869 Sarcoidosis, unspecified: Secondary | ICD-10-CM | POA: Diagnosis not present

## 2014-07-23 DIAGNOSIS — H44113 Panuveitis, bilateral: Secondary | ICD-10-CM | POA: Diagnosis not present

## 2014-08-01 DIAGNOSIS — Z125 Encounter for screening for malignant neoplasm of prostate: Secondary | ICD-10-CM | POA: Diagnosis not present

## 2014-08-01 DIAGNOSIS — I10 Essential (primary) hypertension: Secondary | ICD-10-CM | POA: Diagnosis not present

## 2014-08-01 DIAGNOSIS — G629 Polyneuropathy, unspecified: Secondary | ICD-10-CM | POA: Diagnosis not present

## 2014-08-01 DIAGNOSIS — E1140 Type 2 diabetes mellitus with diabetic neuropathy, unspecified: Secondary | ICD-10-CM | POA: Diagnosis not present

## 2014-08-01 DIAGNOSIS — R351 Nocturia: Secondary | ICD-10-CM | POA: Diagnosis not present

## 2014-08-01 DIAGNOSIS — Z0000 Encounter for general adult medical examination without abnormal findings: Secondary | ICD-10-CM | POA: Diagnosis not present

## 2014-08-12 DIAGNOSIS — Z87442 Personal history of urinary calculi: Secondary | ICD-10-CM | POA: Diagnosis not present

## 2014-08-12 DIAGNOSIS — R351 Nocturia: Secondary | ICD-10-CM | POA: Diagnosis not present

## 2014-08-12 DIAGNOSIS — N401 Enlarged prostate with lower urinary tract symptoms: Secondary | ICD-10-CM | POA: Diagnosis not present

## 2014-08-12 DIAGNOSIS — N1330 Unspecified hydronephrosis: Secondary | ICD-10-CM | POA: Diagnosis not present

## 2014-08-13 DIAGNOSIS — M545 Low back pain: Secondary | ICD-10-CM | POA: Diagnosis not present

## 2014-08-14 DIAGNOSIS — Z79891 Long term (current) use of opiate analgesic: Secondary | ICD-10-CM | POA: Diagnosis not present

## 2014-08-14 DIAGNOSIS — G8929 Other chronic pain: Secondary | ICD-10-CM | POA: Diagnosis not present

## 2014-08-14 DIAGNOSIS — M5136 Other intervertebral disc degeneration, lumbar region: Secondary | ICD-10-CM | POA: Diagnosis not present

## 2014-08-14 DIAGNOSIS — M961 Postlaminectomy syndrome, not elsewhere classified: Secondary | ICD-10-CM | POA: Diagnosis not present

## 2014-08-14 DIAGNOSIS — M4327 Fusion of spine, lumbosacral region: Secondary | ICD-10-CM | POA: Diagnosis not present

## 2014-08-14 DIAGNOSIS — M545 Low back pain: Secondary | ICD-10-CM | POA: Diagnosis not present

## 2014-08-14 DIAGNOSIS — G540 Brachial plexus disorders: Secondary | ICD-10-CM | POA: Diagnosis not present

## 2014-08-15 DIAGNOSIS — M545 Low back pain: Secondary | ICD-10-CM | POA: Diagnosis not present

## 2014-08-20 DIAGNOSIS — M545 Low back pain: Secondary | ICD-10-CM | POA: Diagnosis not present

## 2014-08-22 DIAGNOSIS — M545 Low back pain: Secondary | ICD-10-CM | POA: Diagnosis not present

## 2014-08-27 DIAGNOSIS — M545 Low back pain: Secondary | ICD-10-CM | POA: Diagnosis not present

## 2014-08-29 DIAGNOSIS — M545 Low back pain: Secondary | ICD-10-CM | POA: Diagnosis not present

## 2014-09-11 DIAGNOSIS — M545 Low back pain: Secondary | ICD-10-CM | POA: Diagnosis not present

## 2014-09-11 DIAGNOSIS — Z947 Corneal transplant status: Secondary | ICD-10-CM | POA: Diagnosis not present

## 2014-09-12 DIAGNOSIS — M25672 Stiffness of left ankle, not elsewhere classified: Secondary | ICD-10-CM | POA: Diagnosis not present

## 2014-09-12 DIAGNOSIS — M25561 Pain in right knee: Secondary | ICD-10-CM | POA: Diagnosis not present

## 2014-09-12 DIAGNOSIS — M25562 Pain in left knee: Secondary | ICD-10-CM | POA: Diagnosis not present

## 2014-09-12 DIAGNOSIS — G8929 Other chronic pain: Secondary | ICD-10-CM | POA: Diagnosis not present

## 2014-09-12 DIAGNOSIS — M25671 Stiffness of right ankle, not elsewhere classified: Secondary | ICD-10-CM | POA: Diagnosis not present

## 2014-09-12 DIAGNOSIS — M545 Low back pain: Secondary | ICD-10-CM | POA: Diagnosis not present

## 2014-09-13 DIAGNOSIS — M545 Low back pain: Secondary | ICD-10-CM | POA: Diagnosis not present

## 2014-09-17 DIAGNOSIS — M545 Low back pain: Secondary | ICD-10-CM | POA: Diagnosis not present

## 2014-09-19 DIAGNOSIS — M545 Low back pain: Secondary | ICD-10-CM | POA: Diagnosis not present

## 2014-09-21 ENCOUNTER — Encounter (HOSPITAL_COMMUNITY): Payer: Self-pay | Admitting: Emergency Medicine

## 2014-09-21 ENCOUNTER — Emergency Department (HOSPITAL_COMMUNITY)
Admission: EM | Admit: 2014-09-21 | Discharge: 2014-09-21 | Disposition: A | Discharge status: Home / Self Care | Payer: No Typology Code available for payment source | Attending: Emergency Medicine | Admitting: Emergency Medicine

## 2014-09-21 ENCOUNTER — Emergency Department (HOSPITAL_COMMUNITY): Payer: No Typology Code available for payment source

## 2014-09-21 DIAGNOSIS — Z7982 Long term (current) use of aspirin: Secondary | ICD-10-CM | POA: Diagnosis not present

## 2014-09-21 DIAGNOSIS — Z9889 Other specified postprocedural states: Secondary | ICD-10-CM | POA: Insufficient documentation

## 2014-09-21 DIAGNOSIS — E785 Hyperlipidemia, unspecified: Secondary | ICD-10-CM | POA: Insufficient documentation

## 2014-09-21 DIAGNOSIS — Z7952 Long term (current) use of systemic steroids: Secondary | ICD-10-CM | POA: Diagnosis not present

## 2014-09-21 DIAGNOSIS — Z87891 Personal history of nicotine dependence: Secondary | ICD-10-CM | POA: Insufficient documentation

## 2014-09-21 DIAGNOSIS — S139XXA Sprain of joints and ligaments of unspecified parts of neck, initial encounter: Secondary | ICD-10-CM

## 2014-09-21 DIAGNOSIS — I10 Essential (primary) hypertension: Secondary | ICD-10-CM | POA: Diagnosis not present

## 2014-09-21 DIAGNOSIS — S138XXA Sprain of joints and ligaments of other parts of neck, initial encounter: Secondary | ICD-10-CM | POA: Diagnosis not present

## 2014-09-21 DIAGNOSIS — I2510 Atherosclerotic heart disease of native coronary artery without angina pectoris: Secondary | ICD-10-CM | POA: Diagnosis not present

## 2014-09-21 DIAGNOSIS — S134XXA Sprain of ligaments of cervical spine, initial encounter: Secondary | ICD-10-CM | POA: Insufficient documentation

## 2014-09-21 DIAGNOSIS — Z9861 Coronary angioplasty status: Secondary | ICD-10-CM | POA: Insufficient documentation

## 2014-09-21 DIAGNOSIS — Z947 Corneal transplant status: Secondary | ICD-10-CM | POA: Diagnosis not present

## 2014-09-21 DIAGNOSIS — Y998 Other external cause status: Secondary | ICD-10-CM | POA: Insufficient documentation

## 2014-09-21 DIAGNOSIS — S0990XA Unspecified injury of head, initial encounter: Secondary | ICD-10-CM | POA: Diagnosis not present

## 2014-09-21 DIAGNOSIS — M542 Cervicalgia: Secondary | ICD-10-CM | POA: Diagnosis not present

## 2014-09-21 DIAGNOSIS — H548 Legal blindness, as defined in USA: Secondary | ICD-10-CM | POA: Diagnosis not present

## 2014-09-21 DIAGNOSIS — S199XXA Unspecified injury of neck, initial encounter: Secondary | ICD-10-CM | POA: Diagnosis not present

## 2014-09-21 DIAGNOSIS — Y9389 Activity, other specified: Secondary | ICD-10-CM | POA: Diagnosis not present

## 2014-09-21 DIAGNOSIS — Z79899 Other long term (current) drug therapy: Secondary | ICD-10-CM | POA: Diagnosis not present

## 2014-09-21 DIAGNOSIS — Y92410 Unspecified street and highway as the place of occurrence of the external cause: Secondary | ICD-10-CM | POA: Insufficient documentation

## 2014-09-21 DIAGNOSIS — E119 Type 2 diabetes mellitus without complications: Secondary | ICD-10-CM | POA: Diagnosis not present

## 2014-09-21 MED ORDER — CYCLOBENZAPRINE HCL 10 MG PO TABS: 10.0000 mg | ORAL_TABLET | Freq: Two times a day (BID) | ORAL | Status: DC | PRN

## 2014-09-21 MED ORDER — NAPROXEN 500 MG PO TABS: 500.0000 mg | ORAL_TABLET | Freq: Two times a day (BID) | ORAL | Status: DC

## 2014-09-21 MED ORDER — KETOROLAC TROMETHAMINE 60 MG/2ML IM SOLN
60.0000 mg | Freq: Once | INTRAMUSCULAR | Status: AC
  Administered 2014-09-21: 60 mg via INTRAMUSCULAR
  Filled 2014-09-21: qty 2
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
## 2014-09-21 NOTE — Discharge Instructions (Signed)
Cervical Sprain A cervical sprain is when the tissues (ligaments) that hold the neck bones in place stretch or tear. HOME CARE   Put ice on the injured area.  Put ice in a plastic bag.  Place a towel between your skin and the bag.  Leave the ice on for 15-20 minutes, 3-4 times a day.  You may have been given a collar to wear. This collar keeps your neck from moving while you heal.  Do not take the collar off unless told by your doctor.  If you have long hair, keep it outside of the collar.  Ask your doctor before changing the position of your collar. You may need to change its position over time to make it more comfortable.  If you are allowed to take off the collar for cleaning or bathing, follow your doctor's instructions on how to do it safely.  Keep your collar clean by wiping it with mild soap and water. Dry it completely. If the collar has removable pads, remove them every 1-2 days to hand wash them with soap and water. Allow them to air dry. They should be dry before you wear them in the collar.  Do not drive while wearing the collar.  Only take medicine as told by your doctor.  Keep all doctor visits as told.  Keep all physical therapy visits as told.  Adjust your work station so that you have good posture while you work.  Avoid positions and activities that make your problems worse.  Warm up and stretch before being active. GET HELP IF:  Your pain is not controlled with medicine.  You cannot take less pain medicine over time as planned.  Your activity level does not improve as expected. GET HELP RIGHT AWAY IF:   You are bleeding.  Your stomach is upset.  You have an allergic reaction to your medicine.  You develop new problems that you cannot explain.  You lose feeling (become numb) or you cannot move any part of your body (paralysis).  You have tingling or weakness in any part of your body.  Your symptoms get worse. Symptoms include:  Pain,  soreness, stiffness, puffiness (swelling), or a burning feeling in your neck.  Pain when your neck is touched.  Shoulder or upper back pain.  Limited ability to move your neck.  Headache.  Dizziness.  Your hands or arms feel week, lose feeling, or tingle.  Muscle spasms.  Difficulty swallowing or chewing. MAKE SURE YOU:   Understand these instructions.  Will watch your condition.  Will get help right away if you are not doing well or get worse. Document Released: 03/15/2008 Document Revised: 05/30/2013 Document Reviewed: 04/04/2013 [facility]ExitCare Patient Information 2015 [place]. This information is not intended to replace advice given to you by your health care provider. Make sure you discuss any questions you have with your health care provider.  Please call your doctor for a followup appointment within 24-48 hours. When you talk to your doctor please let them know that you were seen in the emergency department and have them acquire all of your records so that they can discuss the findings with you and formulate a treatment plan to fully care for your new and ongoing problems.

## 2014-09-24 DIAGNOSIS — H44113 Panuveitis, bilateral: Secondary | ICD-10-CM | POA: Diagnosis not present

## 2014-09-24 DIAGNOSIS — E119 Type 2 diabetes mellitus without complications: Secondary | ICD-10-CM | POA: Diagnosis not present

## 2014-09-24 DIAGNOSIS — Z947 Corneal transplant status: Secondary | ICD-10-CM | POA: Diagnosis not present

## 2014-09-24 DIAGNOSIS — D899 Disorder involving the immune mechanism, unspecified: Secondary | ICD-10-CM | POA: Diagnosis not present

## 2014-09-26 DIAGNOSIS — Z79891 Long term (current) use of opiate analgesic: Secondary | ICD-10-CM | POA: Diagnosis not present

## 2014-09-26 DIAGNOSIS — M545 Low back pain: Secondary | ICD-10-CM | POA: Diagnosis not present

## 2014-09-26 DIAGNOSIS — G8929 Other chronic pain: Secondary | ICD-10-CM | POA: Diagnosis not present

## 2014-10-16 DIAGNOSIS — G8929 Other chronic pain: Secondary | ICD-10-CM | POA: Diagnosis not present

## 2014-10-16 DIAGNOSIS — M79652 Pain in left thigh: Secondary | ICD-10-CM | POA: Diagnosis not present

## 2014-10-16 DIAGNOSIS — M79651 Pain in right thigh: Secondary | ICD-10-CM | POA: Diagnosis not present

## 2014-10-16 DIAGNOSIS — M545 Low back pain: Secondary | ICD-10-CM | POA: Diagnosis not present

## 2014-10-18 DIAGNOSIS — M545 Low back pain: Secondary | ICD-10-CM | POA: Diagnosis not present

## 2014-11-18 DIAGNOSIS — M25561 Pain in right knee: Secondary | ICD-10-CM | POA: Diagnosis not present

## 2014-11-18 DIAGNOSIS — R202 Paresthesia of skin: Secondary | ICD-10-CM | POA: Diagnosis not present

## 2014-11-18 DIAGNOSIS — G8929 Other chronic pain: Secondary | ICD-10-CM | POA: Diagnosis not present

## 2014-11-18 DIAGNOSIS — M79671 Pain in right foot: Secondary | ICD-10-CM | POA: Diagnosis not present

## 2014-11-18 DIAGNOSIS — M79672 Pain in left foot: Secondary | ICD-10-CM | POA: Diagnosis not present

## 2014-11-18 DIAGNOSIS — M545 Low back pain: Secondary | ICD-10-CM | POA: Diagnosis not present

## 2014-11-18 DIAGNOSIS — M25562 Pain in left knee: Secondary | ICD-10-CM | POA: Diagnosis not present

## 2014-11-28 DIAGNOSIS — D869 Sarcoidosis, unspecified: Secondary | ICD-10-CM | POA: Diagnosis not present

## 2014-11-28 DIAGNOSIS — H44113 Panuveitis, bilateral: Secondary | ICD-10-CM | POA: Diagnosis not present

## 2014-11-28 DIAGNOSIS — Z947 Corneal transplant status: Secondary | ICD-10-CM | POA: Diagnosis not present

## 2014-11-28 DIAGNOSIS — H5441 Blindness, right eye, normal vision left eye: Secondary | ICD-10-CM | POA: Diagnosis not present

## 2014-11-28 DIAGNOSIS — Z79899 Other long term (current) drug therapy: Secondary | ICD-10-CM | POA: Diagnosis not present

## 2014-12-10 ENCOUNTER — Encounter: Payer: Self-pay | Admitting: *Deleted

## 2014-12-10 DIAGNOSIS — H402224 Chronic angle-closure glaucoma, left eye, indeterminate stage: Secondary | ICD-10-CM | POA: Diagnosis not present

## 2014-12-13 ENCOUNTER — Encounter: Payer: Self-pay | Admitting: Cardiovascular Disease

## 2014-12-13 ENCOUNTER — Ambulatory Visit (INDEPENDENT_AMBULATORY_CARE_PROVIDER_SITE_OTHER): Payer: Medicare Other | Admitting: Cardiovascular Disease

## 2014-12-13 VITALS — BP 112/62 | HR 62 | Resp 16 | Ht 63.0 in | Wt 176.1 lb

## 2014-12-13 DIAGNOSIS — I10 Essential (primary) hypertension: Secondary | ICD-10-CM | POA: Diagnosis not present

## 2014-12-13 DIAGNOSIS — E785 Hyperlipidemia, unspecified: Secondary | ICD-10-CM

## 2014-12-13 DIAGNOSIS — I2510 Atherosclerotic heart disease of native coronary artery without angina pectoris: Secondary | ICD-10-CM | POA: Diagnosis not present

## 2014-12-13 NOTE — Progress Notes (Signed)
Cardiology Office Note   Date:  12/13/2014   ID:  Steven [name], Steven [name] Mar 07, 1956, MRN [id number]  PCP:  Gwynneth Aliment, MD  Cardiologist:   Thurmon Fair, MD   Chief Complaint  Patient presents with  . Annual Exam    patient reports no problems since las visit.      History of Present Illness: Steven [name] is a 59 y.o. male who presents for follow-up for coronary artery disease. He received a stent to the LAD artery in 2007 (last cath 2010 showed mild diffuse in-stent restenosis, nuclear perfusion study in 2014 was normal), hypertension and hyperlipidemia. His vision has improved after adjustment in his medications for corneal transplant. He now sees 20/30. He has not had any angina, dyspnea or other cardiac complaints since his appointment 1 year ago. He continues to walk a mile every day, at a fairly leisurely pace.  Dr. Allyne Gee checked his cholesterol within the last several weeks. I don't have a copy of those results yet, but he did receive a call back that his cholesterol was "good".  Past Medical History  Diagnosis Date  . Diabetes mellitus   . Hypertension   . Chest pain at rest 12/18/2011  . CAD (coronary artery disease), known LAD stent placed in 2007 in [place] ,last cath 2010 with patent stent and nonobstructive CAD 12/18/2011  . 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 History of corneal transplant, lt. eye with blurred vision 12/18/2011  . DM (diabetes mellitus) 12/18/2011  . HTN (hypertension) 12/18/2011  . Dyslipidemia 12/18/2011  . Blindness, legal 12/18/2011    Past Surgical History  Procedure Laterality Date  . Coronary stent placement    . Cardiac surgery      stent placed in 2007  . Corneal transplant    . Coronary angioplasty  2008    STENT PLACED IN LAD.MILD DIFFUSE IN-STENT RESTENOSIS IN 2010.  . Transthoracic echocardiogram  07/28/2009    LV= MILD LVH. EF 60% TO 65%.  . Myocardial perfusion study  01/19/13    NORMAL STRESS NUCLEAR STUDY.  . Vascular US   01/15/09    NORMAL LOWER ARTERIAL DOPPLER     Current Outpatient Prescriptions  Medication Sig Dispense Refill  . aspirin EC 81 MG tablet Take 81 mg by mouth daily.    Marland Kitchen atorvastatin (LIPITOR) 10 MG tablet Take 1 tablet by mouth daily.  2  . cyclobenzaprine (FLEXERIL) 10 MG tablet Take 1 tablet (10 mg total) by mouth 2 (two) times daily as needed for muscle spasms. 20 tablet 0  . cycloSPORINE (SANDIMMUNE) 25 MG capsule Take 75 mg by mouth 2 (two) times daily.    . folic acid (FOLVITE) 1 MG tablet Take 1 mg by mouth daily.    Marland Kitchen HYDROcodone-acetaminophen (NORCO/VICODIN) 5-325 MG per tablet Take 1 tablet by mouth every 6 (six) hours as needed for moderate pain.     Marland Kitchen LINZESS 290 MCG CAPS capsule Take 290 mcg by mouth daily.    Marland Kitchen lisinopril (PRINIVIL,ZESTRIL) 10 MG tablet Take 10 mg by mouth daily.    . methotrexate (RHEUMATREX) 2.5 MG tablet Take 12.5 mg by mouth once a week. Fridays. Caution:Chemotherapy. Protect from light.    . pravastatin (PRAVACHOL) 40 MG tablet Take 40 mg by mouth daily.    . prednisoLONE acetate (PRED FORTE) 1 % ophthalmic suspension Place 1 drop into the left eye daily.    . pregabalin (LYRICA) 150 MG capsule Take 150 mg by mouth 2 (two)  times daily.     No current facility-administered medications for this visit.    Allergies:   Review of patient's allergies indicates no known allergies.    Social History:  The patient  reports that he has quit smoking. He does not have any smokeless tobacco history on file. He reports that he uses illicit drugs (Marijuana). He reports that he does not drink alcohol.   Family History:  The patient's family history includes Heart attack in his mother.    ROS:  Please see the history of present illness.   The patient specifically denies any chest pain at rest or with exertion, dyspnea at rest or with exertion, orthopnea, paroxysmal nocturnal dyspnea, syncope, palpitations, focal neurological deficits, intermittent claudication,  lower extremity edema, unexplained weight gain, cough, hemoptysis or wheezing.  The patient also denies abdominal pain, nausea, vomiting, dysphagia, diarrhea, constipation, polyuria, polydipsia, dysuria, hematuria, frequency, urgency, abnormal bleeding or bruising, fever, chills, unexpected weight changes, mood swings, change in skin or hair texture, change in voice quality, auditory or visual problems, allergic reactions or rashes, new musculoskeletal complaints other than usual "aches and pains".   All other systems are reviewed and negative.    PHYSICAL EXAM: VS:  BP 112/62 mmHg  Pulse 62  Resp 16  Ht 5\' 3"  (1.6 m)  Wt 176 lb 1.6 oz (79.878 kg)  BMI 31.20 kg/m2 , BMI Body mass index is 31.2 kg/(m^2).  General: Alert, oriented x3, no distress Head: no evidence of trauma, PERRL, EOMI, no exophtalmos or lid lag, no myxedema, no xanthelasma; normal ears, nose and oropharynx Neck: normal jugular venous pulsations and no hepatojugular reflux; brisk carotid pulses without delay and no carotid bruits Chest: clear to auscultation, no signs of consolidation by percussion or palpation, normal fremitus, symmetrical and full respiratory excursions Cardiovascular: normal position and quality of the apical impulse, regular rhythm, normal first and second heart sounds, no murmurs, rubs or gallops Abdomen: no tenderness or distention, no masses by palpation, no abnormal pulsatility or arterial bruits, normal bowel sounds, no hepatosplenomegaly Extremities: no clubbing, cyanosis or edema; 2+ radial, ulnar and brachial pulses bilaterally; 2+ right femoral, posterior tibial and dorsalis pedis pulses; 2+ left femoral, posterior tibial and dorsalis pedis pulses; no subclavian or femoral bruits Neurological: grossly nonfocal    EKG:  EKG is ordered today. The ekg ordered today demonstrates normal sinus rhythm with mild early repolarization changes   Recent Labs: No results found for requested labs within  last 365 days.    Lipid Panel    Component Value Date/Time   CHOL 176 12/19/2011 0138   TRIG 59 12/19/2011 0138   HDL 68 12/19/2011 0138   CHOLHDL 2.6 12/19/2011 0138   VLDL 12 12/19/2011 0138   LDLCALC 96 12/19/2011 0138      Wt Readings from Last 3 Encounters:  12/13/14 176 lb 1.6 oz (79.878 kg)  09/21/14 168 lb (76.204 kg)  12/13/13 175 lb 8 oz (79.606 kg)     ASSESSMENT AND PLAN:  Mr. Mayford KnifeWilliams has asymptomatic coronary artery disease status post LAD stent almost 10 years ago. He has well-controlled coronary risk factors. His target LDL cholesterol should be less than 100 mg/deciliter, preferably less than 70 mg/deciliter. Weight loss is recommended. He is congratulated on what sounds like a healthy diet and daily exercise. He will follow-up yearly.   Current medicines are reviewed at length with the patient today.  The patient does not have concerns regarding medicines.  The following changes have been made:  no change  Patient Instructions  Your physician wants you to follow-up in: 1 year or sooner if needed with Dr. Royann Shivers. You will receive a reminder letter in the mail two months in advance. If you don't receive a letter, please call our office to schedule the follow-up appointment.      Joie Bimler, MD  12/13/2014 10:05 AM    [facility] [address] [place], [place], [place]  [address] Phone: [phone]; Fax: [phone]

## 2014-12-13 NOTE — Patient Instructions (Signed)
Your physician wants you to follow-up in: 1 year or sooner if needed with Dr. Croitoru. You will receive a reminder letter in the mail two months in advance. If you don't receive a letter, please call our office to schedule the follow-up appointment. 

## 2014-12-16 DIAGNOSIS — M545 Low back pain: Secondary | ICD-10-CM | POA: Diagnosis not present

## 2014-12-16 DIAGNOSIS — R202 Paresthesia of skin: Secondary | ICD-10-CM | POA: Diagnosis not present

## 2014-12-16 DIAGNOSIS — M542 Cervicalgia: Secondary | ICD-10-CM | POA: Diagnosis not present

## 2014-12-16 DIAGNOSIS — G890 Central pain syndrome: Secondary | ICD-10-CM | POA: Diagnosis not present

## 2015-01-14 DIAGNOSIS — M546 Pain in thoracic spine: Secondary | ICD-10-CM | POA: Diagnosis not present

## 2015-01-14 DIAGNOSIS — M5412 Radiculopathy, cervical region: Secondary | ICD-10-CM | POA: Diagnosis not present

## 2015-01-14 DIAGNOSIS — G541 Lumbosacral plexus disorders: Secondary | ICD-10-CM | POA: Diagnosis not present

## 2015-01-14 DIAGNOSIS — M545 Low back pain: Secondary | ICD-10-CM | POA: Diagnosis not present

## 2015-01-14 DIAGNOSIS — G8929 Other chronic pain: Secondary | ICD-10-CM | POA: Diagnosis not present

## 2015-01-14 DIAGNOSIS — G603 Idiopathic progressive neuropathy: Secondary | ICD-10-CM | POA: Diagnosis not present

## 2015-01-26 DIAGNOSIS — Z5181 Encounter for therapeutic drug level monitoring: Secondary | ICD-10-CM | POA: Diagnosis not present

## 2015-01-26 DIAGNOSIS — Z79899 Other long term (current) drug therapy: Secondary | ICD-10-CM | POA: Diagnosis not present

## 2015-01-26 DIAGNOSIS — M2550 Pain in unspecified joint: Secondary | ICD-10-CM | POA: Diagnosis not present

## 2015-01-27 DIAGNOSIS — E1140 Type 2 diabetes mellitus with diabetic neuropathy, unspecified: Secondary | ICD-10-CM | POA: Diagnosis not present

## 2015-01-27 DIAGNOSIS — Z947 Corneal transplant status: Secondary | ICD-10-CM | POA: Diagnosis not present

## 2015-01-27 DIAGNOSIS — Z79899 Other long term (current) drug therapy: Secondary | ICD-10-CM | POA: Diagnosis not present

## 2015-01-27 DIAGNOSIS — I10 Essential (primary) hypertension: Secondary | ICD-10-CM | POA: Diagnosis not present

## 2015-01-30 DIAGNOSIS — H402224 Chronic angle-closure glaucoma, left eye, indeterminate stage: Secondary | ICD-10-CM | POA: Diagnosis not present

## 2015-02-11 DIAGNOSIS — K5900 Constipation, unspecified: Secondary | ICD-10-CM | POA: Diagnosis not present

## 2015-02-13 DIAGNOSIS — G8929 Other chronic pain: Secondary | ICD-10-CM | POA: Diagnosis not present

## 2015-02-13 DIAGNOSIS — M25561 Pain in right knee: Secondary | ICD-10-CM | POA: Diagnosis not present

## 2015-02-13 DIAGNOSIS — M545 Low back pain: Secondary | ICD-10-CM | POA: Diagnosis not present

## 2015-02-13 DIAGNOSIS — M25562 Pain in left knee: Secondary | ICD-10-CM | POA: Diagnosis not present

## 2015-02-25 DIAGNOSIS — H44113 Panuveitis, bilateral: Secondary | ICD-10-CM | POA: Diagnosis not present

## 2015-04-21 DIAGNOSIS — H402224 Chronic angle-closure glaucoma, left eye, indeterminate stage: Secondary | ICD-10-CM | POA: Diagnosis not present

## 2015-06-17 DIAGNOSIS — H44512 Absolute glaucoma, left eye: Secondary | ICD-10-CM | POA: Insufficient documentation

## 2015-06-17 DIAGNOSIS — H3581 Retinal edema: Secondary | ICD-10-CM | POA: Diagnosis not present

## 2015-06-17 DIAGNOSIS — Z947 Corneal transplant status: Secondary | ICD-10-CM | POA: Diagnosis not present

## 2015-06-17 DIAGNOSIS — H4042X2 Glaucoma secondary to eye inflammation, left eye, moderate stage: Secondary | ICD-10-CM | POA: Diagnosis not present

## 2015-06-17 DIAGNOSIS — I119 Hypertensive heart disease without heart failure: Secondary | ICD-10-CM | POA: Insufficient documentation

## 2015-06-17 DIAGNOSIS — H35033 Hypertensive retinopathy, bilateral: Secondary | ICD-10-CM | POA: Diagnosis not present

## 2015-06-17 DIAGNOSIS — H209 Unspecified iridocyclitis: Secondary | ICD-10-CM | POA: Diagnosis not present

## 2015-06-17 DIAGNOSIS — H44521 Atrophy of globe, right eye: Secondary | ICD-10-CM | POA: Insufficient documentation

## 2015-06-17 DIAGNOSIS — H44111 Panuveitis, right eye: Secondary | ICD-10-CM | POA: Diagnosis not present

## 2015-06-17 DIAGNOSIS — H30033 Focal chorioretinal inflammation, peripheral, bilateral: Secondary | ICD-10-CM | POA: Diagnosis not present

## 2015-06-19 DIAGNOSIS — H30033 Focal chorioretinal inflammation, peripheral, bilateral: Secondary | ICD-10-CM | POA: Diagnosis not present

## 2015-06-19 DIAGNOSIS — Z5181 Encounter for therapeutic drug level monitoring: Secondary | ICD-10-CM | POA: Diagnosis not present

## 2015-08-05 DIAGNOSIS — Z79899 Other long term (current) drug therapy: Secondary | ICD-10-CM | POA: Diagnosis not present

## 2015-08-05 DIAGNOSIS — H3581 Retinal edema: Secondary | ICD-10-CM | POA: Diagnosis not present

## 2015-08-05 DIAGNOSIS — H30033 Focal chorioretinal inflammation, peripheral, bilateral: Secondary | ICD-10-CM | POA: Insufficient documentation

## 2015-08-05 DIAGNOSIS — H44521 Atrophy of globe, right eye: Secondary | ICD-10-CM | POA: Diagnosis not present

## 2015-08-05 DIAGNOSIS — H4042X2 Glaucoma secondary to eye inflammation, left eye, moderate stage: Secondary | ICD-10-CM | POA: Diagnosis not present

## 2015-08-05 DIAGNOSIS — H209 Unspecified iridocyclitis: Secondary | ICD-10-CM | POA: Diagnosis not present

## 2015-08-05 DIAGNOSIS — E1140 Type 2 diabetes mellitus with diabetic neuropathy, unspecified: Secondary | ICD-10-CM | POA: Diagnosis not present

## 2015-08-05 DIAGNOSIS — H35033 Hypertensive retinopathy, bilateral: Secondary | ICD-10-CM | POA: Diagnosis not present

## 2015-08-05 DIAGNOSIS — I10 Essential (primary) hypertension: Secondary | ICD-10-CM | POA: Diagnosis not present

## 2015-08-05 DIAGNOSIS — Z947 Corneal transplant status: Secondary | ICD-10-CM | POA: Diagnosis not present

## 2015-08-18 DIAGNOSIS — I119 Hypertensive heart disease without heart failure: Secondary | ICD-10-CM | POA: Diagnosis not present

## 2015-08-18 DIAGNOSIS — Z125 Encounter for screening for malignant neoplasm of prostate: Secondary | ICD-10-CM | POA: Diagnosis not present

## 2015-08-18 DIAGNOSIS — Z1389 Encounter for screening for other disorder: Secondary | ICD-10-CM | POA: Diagnosis not present

## 2015-08-18 DIAGNOSIS — Z0000 Encounter for general adult medical examination without abnormal findings: Secondary | ICD-10-CM | POA: Diagnosis not present

## 2015-08-18 DIAGNOSIS — E1140 Type 2 diabetes mellitus with diabetic neuropathy, unspecified: Secondary | ICD-10-CM | POA: Diagnosis not present

## 2015-08-18 DIAGNOSIS — R232 Flushing: Secondary | ICD-10-CM | POA: Diagnosis not present

## 2015-08-18 DIAGNOSIS — D8689 Sarcoidosis of other sites: Secondary | ICD-10-CM | POA: Diagnosis not present

## 2015-08-18 DIAGNOSIS — Z1212 Encounter for screening for malignant neoplasm of rectum: Secondary | ICD-10-CM | POA: Diagnosis not present

## 2015-08-18 DIAGNOSIS — H44512 Absolute glaucoma, left eye: Secondary | ICD-10-CM | POA: Diagnosis not present

## 2015-08-18 DIAGNOSIS — I2584 Coronary atherosclerosis due to calcified coronary lesion: Secondary | ICD-10-CM | POA: Diagnosis not present

## 2015-09-02 ENCOUNTER — Encounter: Payer: Self-pay | Admitting: Internal Medicine

## 2015-09-02 ENCOUNTER — Ambulatory Visit (INDEPENDENT_AMBULATORY_CARE_PROVIDER_SITE_OTHER)
Admission: RE | Admit: 2015-09-02 | Discharge: 2015-09-02 | Disposition: A | Discharge status: Home / Self Care | Payer: Medicare Other | Source: Ambulatory Visit | Attending: Internal Medicine | Admitting: Internal Medicine

## 2015-09-02 ENCOUNTER — Ambulatory Visit (INDEPENDENT_AMBULATORY_CARE_PROVIDER_SITE_OTHER): Payer: Medicare Other | Admitting: Internal Medicine

## 2015-09-02 VITALS — BP 130/88 | HR 69 | Ht 63.0 in | Wt 166.0 lb

## 2015-09-02 DIAGNOSIS — I2510 Atherosclerotic heart disease of native coronary artery without angina pectoris: Secondary | ICD-10-CM | POA: Diagnosis not present

## 2015-09-02 DIAGNOSIS — E119 Type 2 diabetes mellitus without complications: Secondary | ICD-10-CM | POA: Diagnosis not present

## 2015-09-02 DIAGNOSIS — R61 Generalized hyperhidrosis: Secondary | ICD-10-CM

## 2015-09-02 NOTE — Progress Notes (Signed)
   Subjective:    Patient ID: Steven [name], male    DOB: 07/11/1956,    MRN: [id number]  HPI  [age] yobm quit smoking 2003 on mtx for corneal transplant f/u [facility] referred to pulmonary clinic 09/02/2015 by Steven [name] for eval of NS ? Sarcoid   09/02/2015 1st [facility] office visit/ Steven [name]   Chief Complaint  Patient presents with  . Pulmonary Consult    Referred by Steven. Dorothyann Pengobyn [name] for eval of Sarcoid.  Pt c/o night sweats for the past 6 months.   ns 3-4 nights per week not soaking, no chills abd pain comes and goes near where appendix was x 4-5 min, never supine  Nausea after meals occ but otherwise   good appetite  s early satiety typically   No obvious  patterns in day to day or daytime variabilty or assoc chronic cough /sob or cp or chest tightness, subjective wheeze overt sinus or hb symptoms. No unusual exp hx or h/o childhood pna/ asthma or knowledge of premature birth.  Sleeping ok without nocturnal  or early am exacerbation  of respiratory  c/o's or need for noct saba. Also denies any obvious fluctuation of symptoms with weather or environmental changes or other aggravating or alleviating factors except as outlined above   Current Medications, Allergies, Complete Past Medical History, Past Surgical History, Family History, and Social History were reviewed in [facility] electronic medical record.            Review of Systems  Constitutional: Positive for diaphoresis. Negative for fever, chills, activity change, appetite change and unexpected weight change.  HENT: Negative for congestion, dental problem, postnasal drip, rhinorrhea, sneezing, sore throat, trouble swallowing and voice change.   Eyes: Negative for visual disturbance.  Respiratory: Negative for cough, choking and shortness of breath.   Cardiovascular: Negative for chest pain and leg swelling.  Gastrointestinal: Positive for abdominal pain. Negative for nausea and vomiting.  Genitourinary: Negative for  difficulty urinating.  Musculoskeletal: Positive for arthralgias.  Skin: Negative for rash.  Psychiatric/Behavioral: Negative for behavioral problems and confusion.       Objective:   Physical Exam  amb bm nad  Wt Readings from Last 3 Encounters:  09/02/15 166 lb (75.297 kg)  12/13/14 176 lb 1.6 oz (79.878 kg)  09/21/14 168 lb (76.204 kg)    Vital signs reviewed   HEENT: nl dentition, turbinates, and oropharynx. Nl external ear canals without cough reflex   NECK :  without JVD/Nodes/TM/ nl carotid upstrokes bilaterally   LUNGS: no acc muscle use, clear to A and P bilaterally without cough on insp or exp maneuvers   CV:  RRR  no s3 or murmur or increase in P2, no edema   ABD:  soft and nontender with nl excursion in the supine position. No bruits or organomegaly, bowel sounds nl  MS:  warm without deformities, calf tenderness, cyanosis or clubbing  SKIN: warm and dry without lesions    NEURO:  alert, approp, no deficits     CXR PA and Lateral:   09/02/2015 :    I personally reviewed images and agree with radiology impression as follows:    No active cardiopulmonary disease.   [place] [facility] [place] Ca, alb/prot         Assessment & Plan:

## 2015-09-02 NOTE — Patient Instructions (Signed)
Please remember to go to the  x-ray department downstairs for your tests - we will call you with the results when they are available along with the lab results from Dr Allyne [facility]' office

## 2015-09-03 DIAGNOSIS — H402224 Chronic angle-closure glaucoma, left eye, indeterminate stage: Secondary | ICD-10-CM | POA: Diagnosis not present

## 2015-09-03 NOTE — Progress Notes (Signed)
Quick Note:  Called and spoke to pt. Informed him of the results and recs per MW. Pt verbalized understanding and denied any further questions or concerns at this time.   ______ 

## 2015-09-04 ENCOUNTER — Encounter: Payer: Self-pay | Admitting: Internal Medicine

## 2015-09-04 NOTE — Assessment & Plan Note (Addendum)
No evidence at all here to support sarcoid or any other granulomatous pulm source for his chronic NS or other systemic complaints  A good rule of thumb is that 95% of sarcoid pts have an abnormal cxr that looks typically much worse than the pt's symptoms would suggest. The opposite is true here and that's why I think sarcoid is not his problem and would not pursue with any further chest imaging at this poin t  Total time devoted to counseling  = 25/2433m review case with pt/ discussion of options/alternatives/ giving and going over instructions (see avs)

## 2015-09-11 DIAGNOSIS — H30033 Focal chorioretinal inflammation, peripheral, bilateral: Secondary | ICD-10-CM | POA: Diagnosis not present

## 2015-09-16 DIAGNOSIS — H4042X2 Glaucoma secondary to eye inflammation, left eye, moderate stage: Secondary | ICD-10-CM | POA: Diagnosis not present

## 2015-09-16 DIAGNOSIS — H209 Unspecified iridocyclitis: Secondary | ICD-10-CM | POA: Diagnosis not present

## 2015-09-16 DIAGNOSIS — H3581 Retinal edema: Secondary | ICD-10-CM | POA: Diagnosis not present

## 2015-09-16 DIAGNOSIS — H44521 Atrophy of globe, right eye: Secondary | ICD-10-CM | POA: Diagnosis not present

## 2015-09-16 DIAGNOSIS — Z79899 Other long term (current) drug therapy: Secondary | ICD-10-CM | POA: Diagnosis not present

## 2015-09-16 DIAGNOSIS — H35033 Hypertensive retinopathy, bilateral: Secondary | ICD-10-CM | POA: Diagnosis not present

## 2015-09-16 DIAGNOSIS — H30033 Focal chorioretinal inflammation, peripheral, bilateral: Secondary | ICD-10-CM | POA: Diagnosis not present

## 2015-09-16 DIAGNOSIS — Z947 Corneal transplant status: Secondary | ICD-10-CM | POA: Diagnosis not present

## 2015-10-28 DIAGNOSIS — H30033 Focal chorioretinal inflammation, peripheral, bilateral: Secondary | ICD-10-CM | POA: Diagnosis not present

## 2015-10-28 DIAGNOSIS — H3581 Retinal edema: Secondary | ICD-10-CM | POA: Diagnosis not present

## 2015-10-28 DIAGNOSIS — Z947 Corneal transplant status: Secondary | ICD-10-CM | POA: Diagnosis not present

## 2015-10-28 DIAGNOSIS — H4042X2 Glaucoma secondary to eye inflammation, left eye, moderate stage: Secondary | ICD-10-CM | POA: Diagnosis not present

## 2015-10-28 DIAGNOSIS — H44521 Atrophy of globe, right eye: Secondary | ICD-10-CM | POA: Diagnosis not present

## 2015-10-28 DIAGNOSIS — H35033 Hypertensive retinopathy, bilateral: Secondary | ICD-10-CM | POA: Diagnosis not present

## 2015-10-28 DIAGNOSIS — Z79899 Other long term (current) drug therapy: Secondary | ICD-10-CM | POA: Diagnosis not present

## 2015-10-28 DIAGNOSIS — H209 Unspecified iridocyclitis: Secondary | ICD-10-CM | POA: Diagnosis not present

## 2015-10-29 DIAGNOSIS — H402222 Chronic angle-closure glaucoma, left eye, moderate stage: Secondary | ICD-10-CM | POA: Diagnosis not present

## 2016-01-12 DIAGNOSIS — H402222 Chronic angle-closure glaucoma, left eye, moderate stage: Secondary | ICD-10-CM | POA: Diagnosis not present

## 2016-02-16 DIAGNOSIS — E1140 Type 2 diabetes mellitus with diabetic neuropathy, unspecified: Secondary | ICD-10-CM | POA: Diagnosis not present

## 2016-02-16 DIAGNOSIS — E559 Vitamin D deficiency, unspecified: Secondary | ICD-10-CM | POA: Diagnosis not present

## 2016-02-16 DIAGNOSIS — I2584 Coronary atherosclerosis due to calcified coronary lesion: Secondary | ICD-10-CM | POA: Diagnosis not present

## 2016-02-16 DIAGNOSIS — R61 Generalized hyperhidrosis: Secondary | ICD-10-CM | POA: Diagnosis not present

## 2016-02-16 DIAGNOSIS — I119 Hypertensive heart disease without heart failure: Secondary | ICD-10-CM | POA: Diagnosis not present

## 2016-02-16 DIAGNOSIS — E785 Hyperlipidemia, unspecified: Secondary | ICD-10-CM | POA: Diagnosis not present

## 2016-05-25 DIAGNOSIS — K641 Second degree hemorrhoids: Secondary | ICD-10-CM | POA: Diagnosis not present

## 2016-05-25 DIAGNOSIS — K5904 Chronic idiopathic constipation: Secondary | ICD-10-CM | POA: Diagnosis not present

## 2016-06-21 DIAGNOSIS — H402222 Chronic angle-closure glaucoma, left eye, moderate stage: Secondary | ICD-10-CM | POA: Diagnosis not present

## 2016-08-16 ENCOUNTER — Emergency Department (HOSPITAL_COMMUNITY)
Admission: EM | Admit: 2016-08-16 | Discharge: 2016-08-16 | Disposition: A | Discharge status: Home / Self Care | Payer: Medicare Other | Attending: Emergency Medicine | Admitting: Emergency Medicine

## 2016-08-16 ENCOUNTER — Encounter (HOSPITAL_COMMUNITY): Payer: Self-pay | Admitting: Emergency Medicine

## 2016-08-16 DIAGNOSIS — E119 Type 2 diabetes mellitus without complications: Secondary | ICD-10-CM | POA: Diagnosis not present

## 2016-08-16 DIAGNOSIS — I10 Essential (primary) hypertension: Secondary | ICD-10-CM | POA: Diagnosis not present

## 2016-08-16 DIAGNOSIS — R202 Paresthesia of skin: Secondary | ICD-10-CM

## 2016-08-16 DIAGNOSIS — I2510 Atherosclerotic heart disease of native coronary artery without angina pectoris: Secondary | ICD-10-CM | POA: Diagnosis not present

## 2016-08-16 DIAGNOSIS — M5412 Radiculopathy, cervical region: Secondary | ICD-10-CM | POA: Diagnosis not present

## 2016-08-16 DIAGNOSIS — Z87891 Personal history of nicotine dependence: Secondary | ICD-10-CM | POA: Insufficient documentation

## 2016-08-16 DIAGNOSIS — R200 Anesthesia of skin: Secondary | ICD-10-CM | POA: Diagnosis present

## 2016-08-16 DIAGNOSIS — Z79899 Other long term (current) drug therapy: Secondary | ICD-10-CM | POA: Insufficient documentation

## 2016-08-16 DIAGNOSIS — Z7982 Long term (current) use of aspirin: Secondary | ICD-10-CM | POA: Diagnosis not present

## 2016-08-16 DIAGNOSIS — Z955 Presence of coronary angioplasty implant and graft: Secondary | ICD-10-CM | POA: Diagnosis not present

## 2016-08-16 MED ORDER — TRAMADOL HCL 50 MG PO TABS: 50.0000 mg | ORAL_TABLET | Freq: Four times a day (QID) | ORAL | 0 refills | Status: DC | PRN

## 2016-08-16 MED ORDER — METHYLPREDNISOLONE 4 MG PO TBPK
ORAL_TABLET | ORAL | 0 refills | Status: DC
Start: 2016-08-16 — End: 2018-08-08

## 2016-08-16 NOTE — ED Provider Notes (Signed)
[facility]-EMERGENCY DEPT Provider Note   CSN: [id number] Arrival date & time: 08/16/16  1049     History   Chief Complaint Chief Complaint  Patient presents with  . Numbness    HPI Steven [name] is a 60 y.o. male.  He presents for evaluation of right arm numbness and shoulder and neck pain. He had previous surgery for a "pinched nerve" in his neck in 2010. Good results from the surgery. About one month ago he started getting some pain in his neck and shoulder. Now has some numbness extending down his right upper extremity. He has normal use of the hand. He is not weaker clumsy with it.  HPI  Past Medical History:  Diagnosis Date  . 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 CAD (coronary artery disease), known LAD stent placed in 2007 in [place] ,last cath 2010 with patent stent and nonobstructive CAD 12/18/2011  . Chest pain at rest 12/18/2011  . Diabetes mellitus   . DM (diabetes mellitus) (HCC) 12/18/2011  . Dyslipidemia 12/18/2011  . 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 History of corneal transplant, lt. eye with blurred vision 12/18/2011  . HTN (hypertension) 12/18/2011  . Hypertension     Patient Active Problem List   Diagnosis Date Noted  . Unexplained night sweats 09/02/2015  . Chronic constipation 12/21/2011  . Chest pain at rest 12/18/2011  . CAD s/p LAD stent 2007 12/18/2011  . 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 History of corneal transplant, lt. eye with blurred vision 12/18/2011  . DM (diabetes mellitus) (HCC) 12/18/2011  . HTN (hypertension) 12/18/2011  . Dyslipidemia 12/18/2011  . 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 CARDIAC SURGERY     stent placed in 2007  . CORNEAL TRANSPLANT    . CORONARY ANGIOPLASTY  2008   STENT PLACED IN LAD.MILD DIFFUSE IN-STENT RESTENOSIS IN 2010.  Marland Kitchen. CORONARY STENT PLACEMENT    . MYOCARDIAL PERFUSION STUDY  01/19/13   NORMAL STRESS NUCLEAR STUDY.  . TRANSTHORACIC ECHOCARDIOGRAM  07/28/2009   LV=  MILD LVH. EF 60% TO 65%.  Marland Kitchen. VASCULAR US  01/15/09   NORMAL LOWER ARTERIAL DOPPLER       Home Medications    Prior to Admission medications   Medication Sig Start Date End Date Taking? Authorizing Provider  aspirin EC 81 MG tablet Take 81 mg by mouth daily.    Historical Provider, MD  atorvastatin (LIPITOR) 10 MG tablet Take 1 tablet by mouth daily. 10/12/14   Historical Provider, MD  cyclobenzaprine (FLEXERIL) 10 MG tablet Take 1 tablet (10 mg total) by mouth 2 (two) times daily as needed for muscle spasms. 09/21/14   Eber HongBrian Miller, MD  cycloSPORINE (SANDIMMUNE) 25 MG capsule Take 75 mg by mouth 2 (two) times daily.    Historical Provider, MD  folic acid (FOLVITE) 1 MG tablet Take 1 mg by mouth daily.    Historical Provider, MD  HYDROcodone-acetaminophen (NORCO/VICODIN) 5-325 MG per tablet Take 1 tablet by mouth every 6 (six) hours as needed for moderate pain.  11/01/13   Historical Provider, MD  LINZESS 290 MCG CAPS capsule Take 290 mcg by mouth daily. 12/10/13   Historical Provider, MD  lisinopril (PRINIVIL,ZESTRIL) 10 MG tablet Take 10 mg by mouth daily.    Historical Provider, MD  methotrexate (RHEUMATREX) 2.5 MG tablet Take 12.5 mg by mouth once a week. Fridays. Caution:Chemotherapy. Protect from light.    Historical Provider, MD  methylPREDNISolone (MEDROL  DOSEPAK) 4 MG TBPK tablet 6 po on day 1, decrease by 1 tab per day 08/16/16   Rolland Porter, MD  prednisoLONE acetate (PRED FORTE) 1 % ophthalmic suspension Place 1 drop into the left eye daily.    Historical Provider, MD  pregabalin (LYRICA) 150 MG capsule Take 150 mg by mouth 2 (two) times daily.    Historical Provider, MD  traMADol (ULTRAM) 50 MG tablet Take 1 tablet (50 mg total) by mouth every 6 (six) hours as needed. 08/16/16   Rolland Porter, MD    Family History Family History  Problem Relation Age of Onset  . Heart attack Mother     Social History Social History  Substance Use Topics  . Smoking status: Former Smoker    Packs/day:  0.75    Years: 8.00    Types: Cigarettes    Quit date: 10/11/2001  . Smokeless tobacco: Never Used  . Alcohol use No     Allergies   Patient has no known allergies.   Review of Systems Review of Systems  Constitutional: Negative for appetite change, chills, diaphoresis, fatigue and fever.  HENT: Negative for mouth sores, sore throat and trouble swallowing.   Eyes: Negative for visual disturbance.  Respiratory: Negative for cough, chest tightness, shortness of breath and wheezing.   Cardiovascular: Negative for chest pain.  Gastrointestinal: Negative for abdominal distention, abdominal pain, diarrhea, nausea and vomiting.  Endocrine: Negative for polydipsia, polyphagia and polyuria.  Genitourinary: Negative for dysuria, frequency and hematuria.  Musculoskeletal: Negative for gait problem.       Neck and shoulder pain  Skin: Negative for color change, pallor and rash.  Neurological: Positive for numbness. Negative for dizziness, syncope, light-headedness and headaches.  Hematological: Does not bruise/bleed easily.  Psychiatric/Behavioral: Negative for behavioral problems and confusion.     Physical Exam Updated Vital Signs BP 133/73 (BP Location: Right Arm) Comment: Simultaneous filing. User may not have seen previous data. Comment (BP Location): Simultaneous filing. User may not have seen previous data.  Pulse (!) 57 Comment: Simultaneous filing. User may not have seen previous data.  Temp 97.9 F (36.6 C) (Oral)   Resp 17 Comment: Simultaneous filing. User may not have seen previous data.  SpO2 100% Comment: Simultaneous filing. User may not have seen previous data.  Physical Exam  Constitutional: He is oriented to person, place, and time. He appears well-developed and well-nourished. No distress.  HENT:  Head: Normocephalic.  Eyes: Conjunctivae are normal. Pupils are equal, round, and reactive to light. No scleral icterus.  Neck: Normal range of motion. Neck supple. No  thyromegaly present.  Cardiovascular: Normal rate and regular rhythm.  Exam reveals no gallop and no friction rub.   No murmur heard. Pulmonary/Chest: Effort normal and breath sounds normal. No respiratory distress. He has no wheezes. He has no rales.  Abdominal: Soft. Bowel sounds are normal. He exhibits no distension. There is no tenderness. There is no rebound.  Musculoskeletal: Normal range of motion.  Neurological: He is alert and oriented to person, place, and time.  Patient describes numbness down the entirety of the right upper extremity. Strong pulses. Good feeling when I touch him. He describes the numbness" is more "pins and needles". He has normal strength to flex and extend the wrist, flex and extend the digits, Ab/abduct the digits.  Skin: Skin is warm and dry. No rash noted.  Psychiatric: He has a normal mood and affect. His behavior is normal.     ED Treatments / Results  Labs (all labs ordered are listed, but only abnormal results are displayed) Labs Reviewed - No data to display  EKG  EKG Interpretation None       Radiology No results found.  Procedures Procedures (including critical care time)  Medications Ordered in ED Medications - No data to display   Initial Impression / Assessment and Plan / ED Course  I have reviewed the triage vital signs and the nursing notes.  Pertinent labs & imaging results that were available during my care of the patient were reviewed by me and considered in my medical decision making (see chart for details).  Clinical Course     Left upper extremity normal. Normal gait and strength in lower extremity's. Symptoms of radiculopathy but no objective findings. Plan Medrol Dosepak area trimmed off for pain. Primary care follow-up in one week if not improving to discuss possible MRI.  I discussed with him his sugars would be high unit 50-200 points higher while on the steroids but should improve as the steroid taper  continuous  Final Clinical Impressions(s) / ED Diagnoses   Final diagnoses:  Cervical radiculopathy  Paresthesia    New Prescriptions New Prescriptions   METHYLPREDNISOLONE (MEDROL DOSEPAK) 4 MG TBPK TABLET    6 po on day 1, decrease by 1 tab per day   TRAMADOL (ULTRAM) 50 MG TABLET    Take 1 tablet (50 mg total) by mouth every 6 (six) hours as needed.     Rolland PorterMark Holly Iannaccone, MD 08/16/16 1319

## 2016-08-16 NOTE — Discharge Instructions (Signed)
If your symptoms have not resolved by the time you finish her prescription, follow-up with your primary care physician for reevaluation and possible discussion of MRI.  Return to ER with left-sided symptoms, lower extremity symptoms, weakness of your arm, or other changes or worsening.

## 2016-08-16 NOTE — ED Triage Notes (Signed)
PT had pain in shoulder and arm starting 3 weeks ago; few days ago tingling in hand and lower forearm started. Hx of pinched nerve in neck- surgery performed in 2010 and has not had problems. Taking tylenol for pain and was helping but not now.

## 2016-08-25 DIAGNOSIS — H44521 Atrophy of globe, right eye: Secondary | ICD-10-CM | POA: Diagnosis not present

## 2016-08-25 DIAGNOSIS — Z947 Corneal transplant status: Secondary | ICD-10-CM | POA: Diagnosis not present

## 2016-09-08 DIAGNOSIS — Z1389 Encounter for screening for other disorder: Secondary | ICD-10-CM | POA: Diagnosis not present

## 2016-09-08 DIAGNOSIS — M25511 Pain in right shoulder: Secondary | ICD-10-CM | POA: Diagnosis not present

## 2016-09-08 DIAGNOSIS — I119 Hypertensive heart disease without heart failure: Secondary | ICD-10-CM | POA: Diagnosis not present

## 2016-09-08 DIAGNOSIS — Z1212 Encounter for screening for malignant neoplasm of rectum: Secondary | ICD-10-CM | POA: Diagnosis not present

## 2016-09-08 DIAGNOSIS — E559 Vitamin D deficiency, unspecified: Secondary | ICD-10-CM | POA: Diagnosis not present

## 2016-09-08 DIAGNOSIS — E1140 Type 2 diabetes mellitus with diabetic neuropathy, unspecified: Secondary | ICD-10-CM | POA: Diagnosis not present

## 2016-09-08 DIAGNOSIS — Z0000 Encounter for general adult medical examination without abnormal findings: Secondary | ICD-10-CM | POA: Diagnosis not present

## 2016-09-08 DIAGNOSIS — R634 Abnormal weight loss: Secondary | ICD-10-CM | POA: Diagnosis not present

## 2016-09-08 DIAGNOSIS — I2584 Coronary atherosclerosis due to calcified coronary lesion: Secondary | ICD-10-CM | POA: Diagnosis not present

## 2016-10-20 DIAGNOSIS — M5412 Radiculopathy, cervical region: Secondary | ICD-10-CM | POA: Diagnosis not present

## 2016-10-20 DIAGNOSIS — M542 Cervicalgia: Secondary | ICD-10-CM | POA: Diagnosis not present

## 2016-10-20 DIAGNOSIS — Z981 Arthrodesis status: Secondary | ICD-10-CM | POA: Diagnosis not present

## 2016-10-20 DIAGNOSIS — M4322 Fusion of spine, cervical region: Secondary | ICD-10-CM | POA: Diagnosis not present

## 2016-11-04 DIAGNOSIS — M9971 Connective tissue and disc stenosis of intervertebral foramina of cervical region: Secondary | ICD-10-CM | POA: Diagnosis not present

## 2016-11-16 DIAGNOSIS — Z79899 Other long term (current) drug therapy: Secondary | ICD-10-CM | POA: Diagnosis not present

## 2016-11-16 DIAGNOSIS — H35033 Hypertensive retinopathy, bilateral: Secondary | ICD-10-CM | POA: Diagnosis not present

## 2016-11-16 DIAGNOSIS — Z947 Corneal transplant status: Secondary | ICD-10-CM | POA: Diagnosis not present

## 2016-11-16 DIAGNOSIS — H4042X2 Glaucoma secondary to eye inflammation, left eye, moderate stage: Secondary | ICD-10-CM | POA: Diagnosis not present

## 2016-11-16 DIAGNOSIS — H209 Unspecified iridocyclitis: Secondary | ICD-10-CM | POA: Diagnosis not present

## 2016-11-16 DIAGNOSIS — H3581 Retinal edema: Secondary | ICD-10-CM | POA: Diagnosis not present

## 2016-11-16 DIAGNOSIS — H30033 Focal chorioretinal inflammation, peripheral, bilateral: Secondary | ICD-10-CM | POA: Diagnosis not present

## 2016-11-16 DIAGNOSIS — H44521 Atrophy of globe, right eye: Secondary | ICD-10-CM | POA: Diagnosis not present

## 2016-11-17 DIAGNOSIS — M5412 Radiculopathy, cervical region: Secondary | ICD-10-CM | POA: Diagnosis not present

## 2016-12-01 DIAGNOSIS — M2578 Osteophyte, vertebrae: Secondary | ICD-10-CM | POA: Diagnosis not present

## 2016-12-01 DIAGNOSIS — Z87891 Personal history of nicotine dependence: Secondary | ICD-10-CM | POA: Diagnosis not present

## 2016-12-01 DIAGNOSIS — H5461 Unqualified visual loss, right eye, normal vision left eye: Secondary | ICD-10-CM | POA: Diagnosis not present

## 2016-12-01 DIAGNOSIS — J383 Other diseases of vocal cords: Secondary | ICD-10-CM | POA: Diagnosis not present

## 2016-12-01 DIAGNOSIS — I10 Essential (primary) hypertension: Secondary | ICD-10-CM | POA: Diagnosis not present

## 2016-12-01 DIAGNOSIS — E119 Type 2 diabetes mellitus without complications: Secondary | ICD-10-CM | POA: Diagnosis not present

## 2016-12-01 DIAGNOSIS — Z01818 Encounter for other preprocedural examination: Secondary | ICD-10-CM | POA: Diagnosis not present

## 2016-12-01 DIAGNOSIS — E785 Hyperlipidemia, unspecified: Secondary | ICD-10-CM | POA: Diagnosis not present

## 2016-12-01 DIAGNOSIS — Z79891 Long term (current) use of opiate analgesic: Secondary | ICD-10-CM | POA: Diagnosis not present

## 2016-12-01 DIAGNOSIS — Z955 Presence of coronary angioplasty implant and graft: Secondary | ICD-10-CM | POA: Diagnosis not present

## 2016-12-01 DIAGNOSIS — Z981 Arthrodesis status: Secondary | ICD-10-CM | POA: Diagnosis not present

## 2016-12-01 DIAGNOSIS — M5412 Radiculopathy, cervical region: Secondary | ICD-10-CM | POA: Diagnosis not present

## 2016-12-01 DIAGNOSIS — Z7982 Long term (current) use of aspirin: Secondary | ICD-10-CM | POA: Diagnosis not present

## 2016-12-01 DIAGNOSIS — M79601 Pain in right arm: Secondary | ICD-10-CM | POA: Diagnosis not present

## 2016-12-01 DIAGNOSIS — Z8673 Personal history of transient ischemic attack (TIA), and cerebral infarction without residual deficits: Secondary | ICD-10-CM | POA: Diagnosis not present

## 2016-12-01 DIAGNOSIS — R490 Dysphonia: Secondary | ICD-10-CM | POA: Diagnosis not present

## 2016-12-01 DIAGNOSIS — I2510 Atherosclerotic heart disease of native coronary artery without angina pectoris: Secondary | ICD-10-CM | POA: Diagnosis not present

## 2016-12-01 DIAGNOSIS — D869 Sarcoidosis, unspecified: Secondary | ICD-10-CM | POA: Diagnosis not present

## 2016-12-01 DIAGNOSIS — Z79899 Other long term (current) drug therapy: Secondary | ICD-10-CM | POA: Diagnosis not present

## 2016-12-01 DIAGNOSIS — R001 Bradycardia, unspecified: Secondary | ICD-10-CM | POA: Diagnosis not present

## 2016-12-02 DIAGNOSIS — Z981 Arthrodesis status: Secondary | ICD-10-CM | POA: Diagnosis not present

## 2016-12-02 DIAGNOSIS — E119 Type 2 diabetes mellitus without complications: Secondary | ICD-10-CM | POA: Diagnosis not present

## 2016-12-02 DIAGNOSIS — E785 Hyperlipidemia, unspecified: Secondary | ICD-10-CM | POA: Diagnosis not present

## 2016-12-02 DIAGNOSIS — M5412 Radiculopathy, cervical region: Secondary | ICD-10-CM | POA: Diagnosis not present

## 2016-12-02 DIAGNOSIS — I10 Essential (primary) hypertension: Secondary | ICD-10-CM | POA: Diagnosis not present

## 2016-12-02 DIAGNOSIS — I2510 Atherosclerotic heart disease of native coronary artery without angina pectoris: Secondary | ICD-10-CM | POA: Diagnosis not present

## 2016-12-02 DIAGNOSIS — M2578 Osteophyte, vertebrae: Secondary | ICD-10-CM | POA: Diagnosis not present

## 2016-12-03 DIAGNOSIS — I10 Essential (primary) hypertension: Secondary | ICD-10-CM | POA: Diagnosis not present

## 2016-12-03 DIAGNOSIS — I2510 Atherosclerotic heart disease of native coronary artery without angina pectoris: Secondary | ICD-10-CM | POA: Diagnosis not present

## 2016-12-03 DIAGNOSIS — M5412 Radiculopathy, cervical region: Secondary | ICD-10-CM | POA: Diagnosis not present

## 2016-12-03 DIAGNOSIS — M2578 Osteophyte, vertebrae: Secondary | ICD-10-CM | POA: Diagnosis not present

## 2016-12-03 DIAGNOSIS — E119 Type 2 diabetes mellitus without complications: Secondary | ICD-10-CM | POA: Diagnosis not present

## 2016-12-03 DIAGNOSIS — E785 Hyperlipidemia, unspecified: Secondary | ICD-10-CM | POA: Diagnosis not present

## 2016-12-07 DIAGNOSIS — H402222 Chronic angle-closure glaucoma, left eye, moderate stage: Secondary | ICD-10-CM | POA: Diagnosis not present

## 2016-12-13 DIAGNOSIS — H402222 Chronic angle-closure glaucoma, left eye, moderate stage: Secondary | ICD-10-CM | POA: Diagnosis not present

## 2017-01-26 DIAGNOSIS — M4322 Fusion of spine, cervical region: Secondary | ICD-10-CM | POA: Diagnosis not present

## 2017-01-26 DIAGNOSIS — M542 Cervicalgia: Secondary | ICD-10-CM | POA: Diagnosis not present

## 2017-03-10 DIAGNOSIS — E1140 Type 2 diabetes mellitus with diabetic neuropathy, unspecified: Secondary | ICD-10-CM | POA: Diagnosis not present

## 2017-03-10 DIAGNOSIS — I2510 Atherosclerotic heart disease of native coronary artery without angina pectoris: Secondary | ICD-10-CM | POA: Diagnosis not present

## 2017-03-10 DIAGNOSIS — Z79899 Other long term (current) drug therapy: Secondary | ICD-10-CM | POA: Diagnosis not present

## 2017-03-10 DIAGNOSIS — I119 Hypertensive heart disease without heart failure: Secondary | ICD-10-CM | POA: Diagnosis not present

## 2017-04-18 DIAGNOSIS — H402222 Chronic angle-closure glaucoma, left eye, moderate stage: Secondary | ICD-10-CM | POA: Diagnosis not present

## 2017-04-18 DIAGNOSIS — H402224 Chronic angle-closure glaucoma, left eye, indeterminate stage: Secondary | ICD-10-CM | POA: Diagnosis not present

## 2017-05-02 DIAGNOSIS — Z947 Corneal transplant status: Secondary | ICD-10-CM | POA: Diagnosis not present

## 2017-05-02 DIAGNOSIS — H26492 Other secondary cataract, left eye: Secondary | ICD-10-CM | POA: Diagnosis not present

## 2017-05-19 DIAGNOSIS — K641 Second degree hemorrhoids: Secondary | ICD-10-CM | POA: Diagnosis not present

## 2017-05-19 DIAGNOSIS — K5904 Chronic idiopathic constipation: Secondary | ICD-10-CM | POA: Diagnosis not present

## 2017-07-19 DIAGNOSIS — Z79899 Other long term (current) drug therapy: Secondary | ICD-10-CM | POA: Diagnosis not present

## 2017-07-19 DIAGNOSIS — I119 Hypertensive heart disease without heart failure: Secondary | ICD-10-CM | POA: Diagnosis not present

## 2017-07-19 DIAGNOSIS — E1140 Type 2 diabetes mellitus with diabetic neuropathy, unspecified: Secondary | ICD-10-CM | POA: Diagnosis not present

## 2017-07-19 DIAGNOSIS — I2510 Atherosclerotic heart disease of native coronary artery without angina pectoris: Secondary | ICD-10-CM | POA: Diagnosis not present

## 2017-08-03 DIAGNOSIS — H402222 Chronic angle-closure glaucoma, left eye, moderate stage: Secondary | ICD-10-CM | POA: Diagnosis not present

## 2017-08-09 DIAGNOSIS — E875 Hyperkalemia: Secondary | ICD-10-CM | POA: Diagnosis not present

## 2017-08-26 DIAGNOSIS — H26492 Other secondary cataract, left eye: Secondary | ICD-10-CM | POA: Diagnosis not present

## 2017-10-10 DIAGNOSIS — Z947 Corneal transplant status: Secondary | ICD-10-CM | POA: Diagnosis not present

## 2017-10-10 DIAGNOSIS — H30033 Focal chorioretinal inflammation, peripheral, bilateral: Secondary | ICD-10-CM | POA: Diagnosis not present

## 2017-10-10 DIAGNOSIS — H402223 Chronic angle-closure glaucoma, left eye, severe stage: Secondary | ICD-10-CM | POA: Diagnosis not present

## 2017-10-21 DIAGNOSIS — H401122 Primary open-angle glaucoma, left eye, moderate stage: Secondary | ICD-10-CM | POA: Diagnosis not present

## 2017-11-14 DIAGNOSIS — H30033 Focal chorioretinal inflammation, peripheral, bilateral: Secondary | ICD-10-CM | POA: Diagnosis not present

## 2017-11-14 DIAGNOSIS — H402223 Chronic angle-closure glaucoma, left eye, severe stage: Secondary | ICD-10-CM | POA: Diagnosis not present

## 2017-11-14 DIAGNOSIS — Z947 Corneal transplant status: Secondary | ICD-10-CM | POA: Diagnosis not present

## 2017-12-23 DIAGNOSIS — H30033 Focal chorioretinal inflammation, peripheral, bilateral: Secondary | ICD-10-CM | POA: Diagnosis not present

## 2017-12-23 DIAGNOSIS — H44521 Atrophy of globe, right eye: Secondary | ICD-10-CM | POA: Diagnosis not present

## 2017-12-23 DIAGNOSIS — H4042X2 Glaucoma secondary to eye inflammation, left eye, moderate stage: Secondary | ICD-10-CM | POA: Diagnosis not present

## 2017-12-23 DIAGNOSIS — Z947 Corneal transplant status: Secondary | ICD-10-CM | POA: Diagnosis not present

## 2017-12-23 DIAGNOSIS — H35033 Hypertensive retinopathy, bilateral: Secondary | ICD-10-CM | POA: Diagnosis not present

## 2017-12-23 DIAGNOSIS — Z79899 Other long term (current) drug therapy: Secondary | ICD-10-CM | POA: Diagnosis not present

## 2017-12-23 DIAGNOSIS — H209 Unspecified iridocyclitis: Secondary | ICD-10-CM | POA: Diagnosis not present

## 2017-12-23 DIAGNOSIS — H3581 Retinal edema: Secondary | ICD-10-CM | POA: Diagnosis not present

## 2017-12-29 DIAGNOSIS — I119 Hypertensive heart disease without heart failure: Secondary | ICD-10-CM | POA: Diagnosis not present

## 2018-01-18 DIAGNOSIS — I2510 Atherosclerotic heart disease of native coronary artery without angina pectoris: Secondary | ICD-10-CM | POA: Diagnosis not present

## 2018-01-18 DIAGNOSIS — E1140 Type 2 diabetes mellitus with diabetic neuropathy, unspecified: Secondary | ICD-10-CM | POA: Diagnosis not present

## 2018-01-18 DIAGNOSIS — E785 Hyperlipidemia, unspecified: Secondary | ICD-10-CM | POA: Diagnosis not present

## 2018-01-18 DIAGNOSIS — I119 Hypertensive heart disease without heart failure: Secondary | ICD-10-CM | POA: Diagnosis not present

## 2018-01-18 DIAGNOSIS — E559 Vitamin D deficiency, unspecified: Secondary | ICD-10-CM | POA: Diagnosis not present

## 2018-02-07 DIAGNOSIS — E875 Hyperkalemia: Secondary | ICD-10-CM | POA: Diagnosis not present

## 2018-03-28 DIAGNOSIS — H3581 Retinal edema: Secondary | ICD-10-CM | POA: Diagnosis not present

## 2018-03-28 DIAGNOSIS — H402224 Chronic angle-closure glaucoma, left eye, indeterminate stage: Secondary | ICD-10-CM | POA: Diagnosis not present

## 2018-03-28 DIAGNOSIS — Z947 Corneal transplant status: Secondary | ICD-10-CM | POA: Diagnosis not present

## 2018-03-28 DIAGNOSIS — H35033 Hypertensive retinopathy, bilateral: Secondary | ICD-10-CM | POA: Diagnosis not present

## 2018-03-28 DIAGNOSIS — H4042X2 Glaucoma secondary to eye inflammation, left eye, moderate stage: Secondary | ICD-10-CM | POA: Diagnosis not present

## 2018-03-28 DIAGNOSIS — H30033 Focal chorioretinal inflammation, peripheral, bilateral: Secondary | ICD-10-CM | POA: Diagnosis not present

## 2018-03-28 DIAGNOSIS — H44521 Atrophy of globe, right eye: Secondary | ICD-10-CM | POA: Diagnosis not present

## 2018-03-28 DIAGNOSIS — Z79899 Other long term (current) drug therapy: Secondary | ICD-10-CM | POA: Diagnosis not present

## 2018-03-28 DIAGNOSIS — H209 Unspecified iridocyclitis: Secondary | ICD-10-CM | POA: Diagnosis not present

## 2018-04-26 DIAGNOSIS — Z947 Corneal transplant status: Secondary | ICD-10-CM | POA: Diagnosis not present

## 2018-04-26 DIAGNOSIS — Z961 Presence of intraocular lens: Secondary | ICD-10-CM | POA: Diagnosis not present

## 2018-06-20 DIAGNOSIS — H4042X2 Glaucoma secondary to eye inflammation, left eye, moderate stage: Secondary | ICD-10-CM | POA: Diagnosis not present

## 2018-06-20 DIAGNOSIS — H209 Unspecified iridocyclitis: Secondary | ICD-10-CM | POA: Diagnosis not present

## 2018-06-20 DIAGNOSIS — H402224 Chronic angle-closure glaucoma, left eye, indeterminate stage: Secondary | ICD-10-CM | POA: Diagnosis not present

## 2018-06-20 DIAGNOSIS — H402214 Chronic angle-closure glaucoma, right eye, indeterminate stage: Secondary | ICD-10-CM | POA: Diagnosis not present

## 2018-06-20 DIAGNOSIS — Z79899 Other long term (current) drug therapy: Secondary | ICD-10-CM | POA: Diagnosis not present

## 2018-06-20 DIAGNOSIS — H35033 Hypertensive retinopathy, bilateral: Secondary | ICD-10-CM | POA: Diagnosis not present

## 2018-06-20 DIAGNOSIS — Z961 Presence of intraocular lens: Secondary | ICD-10-CM | POA: Diagnosis not present

## 2018-06-20 DIAGNOSIS — Z947 Corneal transplant status: Secondary | ICD-10-CM | POA: Diagnosis not present

## 2018-06-20 DIAGNOSIS — H30033 Focal chorioretinal inflammation, peripheral, bilateral: Secondary | ICD-10-CM | POA: Diagnosis not present

## 2018-06-20 DIAGNOSIS — H44521 Atrophy of globe, right eye: Secondary | ICD-10-CM | POA: Diagnosis not present

## 2018-06-20 DIAGNOSIS — H3581 Retinal edema: Secondary | ICD-10-CM | POA: Diagnosis not present

## 2018-07-11 DIAGNOSIS — K5904 Chronic idiopathic constipation: Secondary | ICD-10-CM | POA: Diagnosis not present

## 2018-07-11 DIAGNOSIS — K648 Other hemorrhoids: Secondary | ICD-10-CM | POA: Diagnosis not present

## 2018-08-07 ENCOUNTER — Encounter: Payer: Self-pay | Admitting: Internal Medicine

## 2018-08-08 ENCOUNTER — Ambulatory Visit (INDEPENDENT_AMBULATORY_CARE_PROVIDER_SITE_OTHER): Payer: Medicare Other | Admitting: Internal Medicine

## 2018-08-08 ENCOUNTER — Encounter: Payer: Self-pay | Admitting: Internal Medicine

## 2018-08-08 VITALS — BP 114/76 | HR 70 | Temp 98.2°F | Ht 66.0 in | Wt 167.2 lb

## 2018-08-08 DIAGNOSIS — I119 Hypertensive heart disease without heart failure: Secondary | ICD-10-CM

## 2018-08-08 DIAGNOSIS — E1141 Type 2 diabetes mellitus with diabetic mononeuropathy: Secondary | ICD-10-CM

## 2018-08-08 DIAGNOSIS — R351 Nocturia: Secondary | ICD-10-CM

## 2018-08-08 DIAGNOSIS — E559 Vitamin D deficiency, unspecified: Secondary | ICD-10-CM | POA: Diagnosis not present

## 2018-08-08 DIAGNOSIS — I2510 Atherosclerotic heart disease of native coronary artery without angina pectoris: Secondary | ICD-10-CM | POA: Diagnosis not present

## 2018-08-08 DIAGNOSIS — H6122 Impacted cerumen, left ear: Secondary | ICD-10-CM

## 2018-08-08 DIAGNOSIS — Z0000 Encounter for general adult medical examination without abnormal findings: Secondary | ICD-10-CM

## 2018-08-08 LAB — POCT UA - MICROALBUMIN
Creatinine, POC: 300 mg/dL
Microalbumin Ur, POC: 10 mg/L

## 2018-08-08 LAB — POCT URINALYSIS DIPSTICK
BILIRUBIN UA: NEGATIVE
Glucose, UA: NEGATIVE
Ketones, UA: NEGATIVE
NITRITE UA: NEGATIVE
PROTEIN UA: NEGATIVE
RBC UA: NEGATIVE
SPEC GRAV UA: 1.02 (ref 1.010–1.025)
Urobilinogen, UA: 0.2 E.U./dL
pH, UA: 5.5 (ref 5.0–8.0)

## 2018-08-08 NOTE — Patient Instructions (Signed)

## 2018-08-09 LAB — CMP14+EGFR
ALK PHOS: 52 IU/L (ref 39–117)
ALT: 31 IU/L (ref 0–44)
AST: 26 IU/L (ref 0–40)
Albumin/Globulin Ratio: 1.6 (ref 1.2–2.2)
Albumin: 4.6 g/dL (ref 3.6–4.8)
BUN/Creatinine Ratio: 9 — ABNORMAL LOW (ref 10–24)
BUN: 11 mg/dL (ref 8–27)
Bilirubin Total: 0.8 mg/dL (ref 0.0–1.2)
CALCIUM: 9.3 mg/dL (ref 8.6–10.2)
CO2: 21 mmol/L (ref 20–29)
Chloride: 102 mmol/L (ref 96–106)
Creatinine, Ser: 1.18 mg/dL (ref 0.76–1.27)
GFR calc Af Amer: 77 mL/min/{1.73_m2} (ref >59)
GFR, EST NON AFRICAN AMERICAN: 66 mL/min/{1.73_m2} (ref >59)
Globulin, Total: 2.9 g/dL (ref 1.5–4.5)
Glucose: 121 mg/dL — ABNORMAL HIGH (ref 65–99)
POTASSIUM: 5 mmol/L (ref 3.5–5.2)
Sodium: 137 mmol/L (ref 134–144)
Total Protein: 7.5 g/dL (ref 6.0–8.5)

## 2018-08-09 LAB — CBC
Hematocrit: 41.9 % (ref 37.5–51.0)
Hemoglobin: 14.7 g/dL (ref 13.0–17.7)
MCH: 32.2 pg (ref 26.6–33.0)
MCHC: 35.1 g/dL (ref 31.5–35.7)
MCV: 92 fL (ref 79–97)
Platelets: 219 10*3/uL (ref 150–450)
RBC: 4.56 x10E6/uL (ref 4.14–5.80)
RDW: 14 % (ref 12.3–15.4)
WBC: 3.3 10*3/uL — AB (ref 3.4–10.8)

## 2018-08-09 LAB — HEMOGLOBIN A1C
ESTIMATED AVERAGE GLUCOSE: 123 mg/dL
Hgb A1c MFr Bld: 5.9 % — ABNORMAL HIGH (ref 4.8–5.6)

## 2018-08-09 LAB — LIPID PANEL
CHOLESTEROL TOTAL: 173 mg/dL (ref 100–199)
Chol/HDL Ratio: 2.5 ratio (ref 0.0–5.0)
HDL: 69 mg/dL (ref >39)
LDL Calculated: 94 mg/dL (ref 0–99)
Triglycerides: 50 mg/dL (ref 0–149)
VLDL Cholesterol Cal: 10 mg/dL (ref 5–40)

## 2018-08-09 LAB — VITAMIN D 25 HYDROXY (VIT D DEFICIENCY, FRACTURES): Vit D, 25-Hydroxy: 44.1 ng/mL (ref 30.0–100.0)

## 2018-08-11 NOTE — Progress Notes (Signed)
Your blood count is normal.   Your liver and kidney fxn is stable. Your hba1c is 5.9, this is great. Your LDL, bad chol is 94. Ideally, this should be less than 70. Are you taking your chol meds daily? Your vit d level is fairly good. Pls be sure to take your vit d supplements daily, 2000 units once daily.

## 2018-08-13 NOTE — Progress Notes (Signed)
Subjective:     Patient ID: Steven [name] , male    DOB: 1955/12/04 , 62 y.o.   MRN: [id number]   Chief Complaint  Patient presents with  . Annual Exam    HPI  HE IS HERE TODAY FOR A FULL PHYSICAL EXAM. HE OFFERS NO CONCERNS OR COMPLAINTS AT THIS TIME.   Diabetes  He presents for his follow-up diabetic visit. He has type 2 diabetes mellitus. His disease course has been stable. There are no hypoglycemic associated symptoms. Associated symptoms include foot paresthesias. There are no hypoglycemic complications. Diabetic complications include peripheral neuropathy. Risk factors for coronary artery disease include dyslipidemia, diabetes mellitus, hypertension and male sex.  Hypertension  This is a chronic problem. The current episode started more than 1 year ago. The problem has been gradually improving since onset. The problem is controlled. There are no compliance problems.      Past Medical History:  Diagnosis Date  . 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 CAD (coronary artery disease), known LAD stent placed in 2007 in [place]. ,last cath 2010 with patent stent and nonobstructive CAD 12/18/2011  . Chest pain at rest 12/18/2011  . Diabetes mellitus   . DM (diabetes mellitus) ([place]) 12/18/2011  . Dyslipidemia 12/18/2011  . 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 History of corneal transplant, lt. eye with blurred vision 12/18/2011  . HTN (hypertension) 12/18/2011  . Hypertension      Family History  Problem Relation Age of Onset  . Heart attack Mother      Current Outpatient Medications:  .  ACCU-CHEK FASTCLIX LANCETS MISC, by Does not apply route., Disp: , Rfl:  .  glucose blood (ACCU-CHEK GUIDE) test strip, 1 each by Other route as needed for other. Use as instructed, Disp: , Rfl:  .  aspirin EC 81 MG tablet, Take 81 mg by mouth daily., Disp: , Rfl:  .  atorvastatin (LIPITOR) 10 MG tablet, Take 1 tablet by mouth daily., Disp: , Rfl: 2 .  cycloSPORINE (SANDIMMUNE) 25 MG capsule, Take 75 mg by  mouth 2 (two) times daily., Disp: , Rfl:  .  folic acid (FOLVITE) 1 MG tablet, Take 1 mg by mouth daily., Disp: , Rfl:  .  LINZESS 290 MCG CAPS capsule, Take 290 mcg by mouth daily., Disp: , Rfl:  .  lisinopril (PRINIVIL,ZESTRIL) 10 MG tablet, Take 10 mg by mouth daily., Disp: , Rfl:  .  methotrexate (RHEUMATREX) 2.5 MG tablet, Take 12.5 mg by mouth once a week. Fridays. Caution:Chemotherapy. Protect from light., Disp: , Rfl:  .  prednisoLONE acetate (PRED FORTE) 1 % ophthalmic suspension, Place 1 drop into the left eye daily., Disp: , Rfl:  .  pregabalin (LYRICA) 150 MG capsule, Take 150 mg by mouth 2 (two) times daily., Disp: , Rfl:  .  traMADol (ULTRAM) 50 MG tablet, Take 1 tablet (50 mg total) by mouth every 6 (six) hours as needed., Disp: 15 tablet, Rfl: 0   Men's preventive visit. Patient Health Questionnaire (PHQ-2) is    Office Visit from 08/08/2018 in [facility]  PHQ-2 Total Score  0    . Patient is on a DIABETIC diet. Marital status: Divorced. Relevant history for alcohol use is:  Social History   Substance and Sexual Activity  Alcohol Use No  . Alcohol/week: 0.0 standard drinks  . Relevant history for tobacco use is:  Social History   Tobacco Use  Smoking Status Former Smoker  . Packs/day: 0.75  . Years:  8.00  . Pack years: 6.00  . Types: Cigarettes  . Last attempt to quit: 10/11/2001  . Years since quitting: 16.8  Smokeless Tobacco Never Used  . No Known Allergies   Review of Systems  Constitutional: Negative.   HENT: Negative.   Respiratory: Negative.   Cardiovascular: Negative.   Gastrointestinal: Negative.   Endocrine: Negative.   Genitourinary: Negative.   Musculoskeletal: Negative.   Skin: Negative.   Allergic/Immunologic: Negative.   Neurological: Positive for numbness.  Hematological: Negative.   Psychiatric/Behavioral: Negative.      Today's Vitals   08/08/18 0846  BP: 114/76  Pulse: 70  Temp: 98.2 F (36.8 C)  TempSrc:  Oral  Weight: 167 lb 3.2 oz (75.8 kg)  Height: _0  (1.676 m)   Body mass index is 26.99 kg/m.   Objective:  Physical Exam  Constitutional: He is oriented to person, place, and time. He appears well-developed and well-nourished.  HENT:  Head: Normocephalic and atraumatic.  Right Ear: Hearing, tympanic membrane, external ear and ear canal normal.  Left Ear: Hearing and external ear normal.  Nose: Nose normal.  Mouth/Throat: Oropharynx is clear and moist.  LEFT EAR W/ CERUMEN IMPACTION. TM NOT VISUALIZED. THERE IS SLIGHT ERYTHEMA OF CANAL  Eyes: Conjunctivae and EOM are normal.  IRREGULAR PUPILS, CLOUDY SCLERAE  Neck: Normal range of motion. Neck supple.  Cardiovascular: Normal rate, regular rhythm, normal heart sounds and intact distal pulses.  Pulmonary/Chest: Effort normal and breath sounds normal.  Abdominal: Soft. He exhibits no distension. There is no tenderness. There is no guarding.  Genitourinary:  Genitourinary Comments: DEFERRED  Musculoskeletal: Normal range of motion.  Feet:  Right Foot:  Protective Sensation: 5 sites tested. 5 sites sensed.  Skin Integrity: Negative for ulcer or blister.  Left Foot:  Protective Sensation: 5 sites tested. 4 sites sensed.  Skin Integrity: Positive for callus. Negative for ulcer or blister.  Neurological: He is alert and oriented to person, place, and time.  Skin: Skin is warm and dry.  Psychiatric: He has a normal mood and affect.  Nursing note and vitals reviewed.       Assessment And Plan:     1. Routine general medical examination at health care facility  A FULL EXAM WAS PERFORMED. DRE DEFERRED AS PER PATIENTS REQUEST. PATIENT HAS BEEN ADVISED TO GET 30-45 MINUTES REGULAR EXERCISE NO LESS THAN FOUR TO FIVE DAYS PER WEEK - BOTH WEIGHTBEARING EXERCISES AND AEROBIC ARE RECOMMENDED.  HE IS ADVISED TO FOLLOW A HEALTHY DIET WITH AT LEAST SIX FRUITS/VEGGIES PER DAY, DECREASE INTAKE OF RED MEAT, AND TO INCREASE FISH INTAKE TO TWO  DAYS PER WEEK.  MEATS/FISH SHOULD NOT BE FRIED, BAKED OR BROILED IS PREFERABLE.  I SUGGEST WEARING SPF 50 SUNSCREEN ON EXPOSED PARTS AND ESPECIALLY WHEN IN THE DIRECT SUNLIGHT FOR AN EXTENDED PERIOD OF TIME.  PLEASE AVOID FAST FOOD RESTAURANTS AND INCREASE YOUR WATER INTAKE.  2. Diabetic mononeuropathy associated with type 2 diabetes mellitus ([place])  DIABETIC FOOT EXAM WAS PERFORMED.  I DISCUSSED WITH THE PATIENT AT LENGTH REGARDING THE GOALS OF GLYCEMIC CONTROL AND POSSIBLE LONG-TERM COMPLICATIONS.  I  ALSO STRESSED THE IMPORTANCE OF COMPLIANCE WITH HOME GLUCOSE MONITORING, DIETARY RESTRICTIONS INCLUDING AVOIDANCE OF SUGARY DRINKS/PROCESSED FOODS,  ALONG WITH REGULAR EXERCISE.  I  ALSO STRESSED THE IMPORTANCE OF ANNUAL EYE EXAMS, SELF FOOT CARE AND COMPLIANCE WITH OFFICE VISITS. - CBC no Diff - CMP14+EGFR - Hemoglobin A1c - Lipid Profile  3. Benign hypertensive heart disease without heart failure  WELL CONTROLLED. HE WILL CONTINUE WITH CURRENT MEDS. HE IS ENCOURAGED TO AVOID ADDING SALT TO HIS FOODS. EKG PERFORMED.   4. Atherosclerosis of native coronary artery of native heart without angina pectoris  CHRONIC, YET STABLE. IMPORTANCE OF DIETARY AND MEDICATION COMPLIANCE WAS DISCUSSED WITH THE PATIENT.   5. Vitamin D deficiency disease  I WILL CHECK A VIT D LEVEL AND SUPPLEMENT AS NEEDED.  HE IS ALSO ENCOURAGED TO SPEND 15 MINUTES IN THE SUN DAILY.  - Vitamin D (25 hydroxy)  6. Nocturia  I WILL CHECK A PSA TODAY.   7. Impacted cerumen, left ear  AFTER OBTAINING VERBAL CONSENT, LEFT EAR WAS FLUSHED BY IRRIGATION. HE SUFFERED NO COMPLICATIONS. NO TM ABNORMALITIES WERE NOTED.   8. Need for vaccination  HE WAS GIVEN FLU VACCINE.   [name], MD

## 2018-08-13 NOTE — Addendum Note (Signed)
Addended by: Gwynneth Aliment on: 08/13/2018 01:24 PM   Modules accepted: Orders

## 2018-08-15 ENCOUNTER — Telehealth: Payer: Self-pay

## 2018-08-15 DIAGNOSIS — H3581 Retinal edema: Secondary | ICD-10-CM | POA: Diagnosis not present

## 2018-08-15 DIAGNOSIS — H44521 Atrophy of globe, right eye: Secondary | ICD-10-CM | POA: Diagnosis not present

## 2018-08-15 DIAGNOSIS — H30033 Focal chorioretinal inflammation, peripheral, bilateral: Secondary | ICD-10-CM | POA: Diagnosis not present

## 2018-08-15 DIAGNOSIS — Z947 Corneal transplant status: Secondary | ICD-10-CM | POA: Diagnosis not present

## 2018-08-15 DIAGNOSIS — H35033 Hypertensive retinopathy, bilateral: Secondary | ICD-10-CM | POA: Diagnosis not present

## 2018-08-15 DIAGNOSIS — Z79899 Other long term (current) drug therapy: Secondary | ICD-10-CM | POA: Diagnosis not present

## 2018-08-15 NOTE — Telephone Encounter (Signed)
-----   Message from Dorothyann Peng, MD sent at 08/11/2018  9:17 PM EDT ----- Your blood count is normal.   Your liver and kidney fxn is stable. Your hba1c is 5.9, this is great. Your LDL, bad chol is 94. Ideally, this should be less than 70. Are you taking your chol meds daily? Your vit d level is fairly good. Pls be sure to take your vit d supplements daily, 2000 units once daily.

## 2018-08-16 ENCOUNTER — Telehealth: Payer: Self-pay

## 2018-08-16 NOTE — Telephone Encounter (Signed)
The pt said that he was told to cut back on his Atrovastatin and to take it only on Mon-Fri by Dr. Clelia Croft and that he doesn't remember why.

## 2018-08-28 ENCOUNTER — Other Ambulatory Visit: Payer: Self-pay | Admitting: Internal Medicine

## 2018-08-29 NOTE — Telephone Encounter (Signed)
Lyrica 25 mg refill

## 2018-08-31 ENCOUNTER — Other Ambulatory Visit: Payer: Self-pay

## 2018-08-31 ENCOUNTER — Other Ambulatory Visit: Payer: Self-pay | Admitting: Internal Medicine

## 2018-08-31 MED ORDER — PREGABALIN 25 MG PO CAPS: 25.0000 mg | ORAL_CAPSULE | Freq: Two times a day (BID) | ORAL | 1 refills | Status: DC

## 2018-10-10 DIAGNOSIS — H30033 Focal chorioretinal inflammation, peripheral, bilateral: Secondary | ICD-10-CM | POA: Diagnosis not present

## 2018-10-10 DIAGNOSIS — I10 Essential (primary) hypertension: Secondary | ICD-10-CM | POA: Diagnosis not present

## 2018-10-10 DIAGNOSIS — Z947 Corneal transplant status: Secondary | ICD-10-CM | POA: Diagnosis not present

## 2018-10-10 DIAGNOSIS — H35033 Hypertensive retinopathy, bilateral: Secondary | ICD-10-CM | POA: Diagnosis not present

## 2018-10-10 DIAGNOSIS — H44521 Atrophy of globe, right eye: Secondary | ICD-10-CM | POA: Diagnosis not present

## 2018-10-10 DIAGNOSIS — Z79899 Other long term (current) drug therapy: Secondary | ICD-10-CM | POA: Diagnosis not present

## 2018-10-10 DIAGNOSIS — H209 Unspecified iridocyclitis: Secondary | ICD-10-CM | POA: Diagnosis not present

## 2018-10-10 DIAGNOSIS — H3581 Retinal edema: Secondary | ICD-10-CM | POA: Diagnosis not present

## 2018-10-10 DIAGNOSIS — H4042X2 Glaucoma secondary to eye inflammation, left eye, moderate stage: Secondary | ICD-10-CM | POA: Diagnosis not present

## 2018-10-10 DIAGNOSIS — H402224 Chronic angle-closure glaucoma, left eye, indeterminate stage: Secondary | ICD-10-CM | POA: Diagnosis not present

## 2018-11-08 DIAGNOSIS — H18421 Band keratopathy, right eye: Secondary | ICD-10-CM | POA: Diagnosis not present

## 2018-11-08 DIAGNOSIS — Z947 Corneal transplant status: Secondary | ICD-10-CM | POA: Diagnosis not present

## 2018-11-08 DIAGNOSIS — Z961 Presence of intraocular lens: Secondary | ICD-10-CM | POA: Diagnosis not present

## 2018-11-08 DIAGNOSIS — T86841 Corneal transplant failure: Secondary | ICD-10-CM | POA: Diagnosis not present

## 2018-12-18 DIAGNOSIS — H209 Unspecified iridocyclitis: Secondary | ICD-10-CM | POA: Diagnosis not present

## 2018-12-18 DIAGNOSIS — H4042X2 Glaucoma secondary to eye inflammation, left eye, moderate stage: Secondary | ICD-10-CM | POA: Diagnosis not present

## 2018-12-18 DIAGNOSIS — H402224 Chronic angle-closure glaucoma, left eye, indeterminate stage: Secondary | ICD-10-CM | POA: Diagnosis not present

## 2018-12-26 DIAGNOSIS — H30033 Focal chorioretinal inflammation, peripheral, bilateral: Secondary | ICD-10-CM | POA: Diagnosis not present

## 2018-12-26 DIAGNOSIS — H35033 Hypertensive retinopathy, bilateral: Secondary | ICD-10-CM | POA: Diagnosis not present

## 2018-12-26 DIAGNOSIS — Z79899 Other long term (current) drug therapy: Secondary | ICD-10-CM | POA: Diagnosis not present

## 2018-12-26 DIAGNOSIS — H3581 Retinal edema: Secondary | ICD-10-CM | POA: Diagnosis not present

## 2018-12-26 DIAGNOSIS — Z947 Corneal transplant status: Secondary | ICD-10-CM | POA: Diagnosis not present

## 2018-12-26 DIAGNOSIS — H44521 Atrophy of globe, right eye: Secondary | ICD-10-CM | POA: Diagnosis not present

## 2019-01-04 ENCOUNTER — Ambulatory Visit: Payer: Self-pay

## 2019-01-04 ENCOUNTER — Other Ambulatory Visit: Payer: Self-pay

## 2019-01-04 ENCOUNTER — Ambulatory Visit: Payer: Self-pay | Admitting: Internal Medicine

## 2019-01-04 ENCOUNTER — Ambulatory Visit (INDEPENDENT_AMBULATORY_CARE_PROVIDER_SITE_OTHER): Payer: Medicare Other

## 2019-01-04 VITALS — BP 160/100 | Ht 65.0 in | Wt 165.0 lb

## 2019-01-04 DIAGNOSIS — Z0000 Encounter for general adult medical examination without abnormal findings: Secondary | ICD-10-CM

## 2019-01-04 NOTE — Progress Notes (Signed)
Subjective:   Steven [name] is a 63 y.o. male who presents for Medicare Annual/Subsequent preventive examination.  This visit is being conducted via telephone due to the COVID-19 pandemic.  This patient has given me verbal consent via telephone to conduct this visit.  Some vital signs may be absent or patient reported.   Review of Systems:  n/a Cardiac Risk Factors include: diabetes mellitus;male gender;hypertension     Objective:    Vitals: BP (!) 160/100 Comment: patient stated  Ht 5\' 5"  (1.651 m)   Wt 165 lb (74.8 kg) Comment: patient stated  BMI 27.46 kg/m   Body mass index is 27.46 kg/m.  Advanced Directives 01/04/2019 08/16/2016 09/21/2014 12/18/2011  Does Patient Have a Medical Advance Directive? Yes No No Patient does not have advance directive  Type of Advance Directive Healthcare Power of Attorney - - -  Copy of Healthcare Power of Attorney in Chart? No - copy requested - - -  Would patient like information on creating a medical advance directive? - No - patient declined information No - patient declined information -  Pre-existing out of facility DNR order (yellow form or pink MOST form) - - - No    Tobacco Social History   Tobacco Use  Smoking Status Former Smoker  . Packs/day: 0.75  . Years: 8.00  . Pack years: 6.00  . Types: Cigarettes  . Last attempt to quit: 10/11/2001  . Years since quitting: 17.2  Smokeless Tobacco Never Used     Counseling given: Not Answered   Clinical Intake:  Pre-visit preparation completed: Yes  Pain : No/denies pain Pain Score: 0-No pain     Nutritional Status: BMI 25 -29 Overweight Nutritional Risks: None Diabetes: Yes  How often do you need to have someone help you when you read instructions, pamphlets, or other written materials from your doctor or pharmacy?: 1 - Never What is the last grade level you completed in school?: 11th grade  Interpreter Needed?: No  Information entered by :: NAllen LPN  Past Medical  History:  Diagnosis Date  . 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 CAD (coronary artery disease), known LAD stent placed in 2007 in [place] ,last cath 2010 with patent stent and nonobstructive CAD 12/18/2011  . Chest pain at rest 12/18/2011  . Diabetes mellitus   . DM (diabetes mellitus) ([facility]) 12/18/2011  . Dyslipidemia 12/18/2011  . 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 History of corneal transplant, lt. eye with blurred vision 12/18/2011  . HTN (hypertension) 12/18/2011  . Hypertension    Past Surgical History:  Procedure Laterality Date  . CARDIAC SURGERY     stent placed in 2007  . CORNEAL TRANSPLANT    . CORONARY ANGIOPLASTY  2008   STENT PLACED IN LAD.MILD DIFFUSE IN-STENT RESTENOSIS IN 2010.  Marland Kitchen CORONARY STENT PLACEMENT    . MYOCARDIAL PERFUSION STUDY  01/19/13   NORMAL STRESS NUCLEAR STUDY.  . TRANSTHORACIC ECHOCARDIOGRAM  07/28/2009   LV= MILD LVH. EF 60% TO 65%.  Marland Kitchen VASCULAR US  01/15/09   NORMAL LOWER ARTERIAL DOPPLER   Family History  Problem Relation Age of Onset  . Heart attack Mother    Social History   Socioeconomic History  . Marital status: Divorced    Spouse name: Not on file  . Number of children: Not on file  . Years of education: Not on file  . Highest education level: Not on file  Occupational History  . Occupation: disability  Social Needs  .  Financial resource strain: Not hard at all  . Food insecurity:    Worry: Never true    Inability: Never true  . Transportation needs:    Medical: No    Non-medical: No  Tobacco Use  . Smoking status: Former Smoker    Packs/day: 0.75    Years: 8.00    Pack years: 6.00    Types: Cigarettes    Last attempt to quit: 10/11/2001    Years since quitting: 17.2  . Smokeless tobacco: Never Used  Substance and Sexual Activity  . Alcohol use: No    Alcohol/week: 0.0 standard drinks  . Drug use: Not Currently  . Sexual activity: Yes  Lifestyle  . Physical activity:    Days per week: 7 days    Minutes per session: 60 min  . Stress:  Not at all  Relationships  . Social connections:    Talks on phone: Not on file    Gets together: Not on file    Attends religious service: Not on file    Active member of club or organization: Not on file    Attends meetings of clubs or organizations: Not on file    Relationship status: Not on file  Other Topics Concern  . Not on file  Social History Narrative  . Not on file    Outpatient Encounter Medications as of 01/04/2019  Medication Sig  . ACCU-CHEK FASTCLIX LANCETS MISC by Does not apply route.  Marland Kitchen aspirin EC 81 MG tablet Take 81 mg by mouth daily.  Marland Kitchen atorvastatin (LIPITOR) 10 MG tablet Take 1 tablet by mouth daily.  . cycloSPORINE (SANDIMMUNE) 25 MG capsule Take 75 mg by mouth 2 (two) times daily.  . folic acid (FOLVITE) 1 MG tablet Take 1 mg by mouth daily.  Marland Kitchen glucose blood (ACCU-CHEK GUIDE) test strip 1 each by Other route as needed for other. Use as instructed  . LINZESS 290 MCG CAPS capsule Take 290 mcg by mouth daily.  Marland Kitchen lisinopril (PRINIVIL,ZESTRIL) 10 MG tablet Take 10 mg by mouth daily.  . methotrexate (RHEUMATREX) 2.5 MG tablet Take 12.5 mg by mouth once a week. Fridays. Caution:Chemotherapy. Protect from light.  . prednisoLONE acetate (PRED FORTE) 1 % ophthalmic suspension Place 1 drop into the left eye daily.  . pregabalin (LYRICA) 25 MG capsule Take 1 capsule (25 mg total) by mouth 2 (two) times daily.  . traMADol (ULTRAM) 50 MG tablet Take 1 tablet (50 mg total) by mouth every 6 (six) hours as needed.   No facility-administered encounter medications on file as of 01/04/2019.     Activities of Daily Living In your present state of health, do you have any difficulty performing the following activities: 01/04/2019  Hearing? N  Vision? Y  Comment glaucoma  Difficulty concentrating or making decisions? N  Walking or climbing stairs? N  Dressing or bathing? N  Doing errands, shopping? N  Preparing Food and eating ? N  Using the Toilet? N  In the past six  months, have you accidently leaked urine? N  Do you have problems with loss of bowel control? N  Managing your Medications? N  Managing your Finances? N  Housekeeping or managing your Housekeeping? N  Some recent data might be hidden    Patient Care Team: Dorothyann Peng, MD as PCP - General (Internal Medicine) Holli Humbles, MD as Referring Physician (Ophthalmology)   Assessment:   This is a routine wellness examination for Kaya.  Exercise Activities and Dietary recommendations Current Exercise  Habits: Home exercise routine, Type of exercise: walking, Time (Minutes): 60, Frequency (Times/Week): 7, Weekly Exercise (Minutes/Week): 420, Intensity: Moderate  Goals    . Patient Stated     No goals       Fall Risk Fall Risk  01/04/2019 08/08/2018  Falls in the past year? 0 No  Risk for fall due to : Medication side effect -  Follow up Falls prevention discussed -   Is the patient's home free of loose throw rugs in walkways, pet beds, electrical cords, etc?   yes      Grab bars in the bathroom? no      Handrails on the stairs?   yes      Adequate lighting?   yes  Timed Get Up and Go Performed: n/a  Depression Screen PHQ 2/9 Scores 01/04/2019 08/08/2018  PHQ - 2 Score 0 0  PHQ- 9 Score 0 -    Cognitive Function     6CIT Screen 01/04/2019  What Year? 0 points  What month? 0 points  What time? 0 points  Count back from 20 0 points  Months in reverse 0 points  Repeat phrase 0 points  Total Score 0     There is no immunization history on file for this patient.  Qualifies for Shingles Vaccine? Yes   Screening Tests Health Maintenance  Topic Date Due  . FOOT EXAM  05/28/1967  . OPHTHALMOLOGY EXAM  05/28/1967  . HIV Screening  05/27/1972  . INFLUENZA VACCINE  09/07/2019 (Originally 05/11/2018)  . PNEUMOCOCCAL POLYSACCHARIDE VACCINE AGE 74-64 HIGH RISK  01/04/2020 (Originally 05/28/1959)  . HEMOGLOBIN A1C  02/07/2019  . COLONOSCOPY  07/03/2023  . TETANUS/TDAP   07/07/2028  . Hepatitis C Screening  Completed   Cancer Screenings: Lung: Low Dose CT Chest recommended if Age 99-80 years, 30 pack-year currently smoking OR have quit w/in 15years. Patient does not qualify. Colorectal: up to date  Additional Screenings:  Hepatitis C Screening:12/08/2009 <0.1      Plan:   Declines vaccinations. No goals set at this time  I have personally reviewed and noted the following in the patient's chart:   . Medical and social history . Use of alcohol, tobacco or illicit drugs  . Current medications and supplements . Functional ability and status . Nutritional status . Physical activity . Advanced directives . List of other physicians . Hospitalizations, surgeries, and ER visits in previous 12 months . Vitals . Screenings to include cognitive, depression, and falls . Referrals and appointments  In addition, I have reviewed and discussed with patient certain preventive protocols, quality metrics, and best practice recommendations. A written personalized care plan for preventive services as well as general preventive health recommendations were provided to patient.     Barb Merino, LPN  [id number]

## 2019-01-04 NOTE — Patient Instructions (Addendum)
[name] , Thank you for taking time to come for your Medicare Wellness Visit. I appreciate your ongoing commitment to your health goals. Please review the following plan we discussed and let me know if I can assist you in the future.   Screening recommendations/referrals: Colonoscopy: 06/2013 Recommended yearly ophthalmology/optometry visit for glaucoma screening and checkup Recommended yearly dental visit for hygiene and checkup  Vaccinations: Influenza vaccine: declines Pneumococcal vaccine: declines Tdap vaccine: 06/2018 Shingles vaccine: declines    Advanced directives:Please bring a copy of your POA (Power of Attorney) and/or Living Will to your next appointment.   Conditions/risks identified: overweight  Next appointment: 08/13/2019 at 930  Preventive Care 40-64 Years, Male Preventive care refers to lifestyle choices and visits with your health care provider that can promote health and wellness. What does preventive care include?  A yearly physical exam. This is also called an annual well check.  Dental exams once or twice a year.  Routine eye exams. Ask your health care provider how often you should have your eyes checked.  Personal lifestyle choices, including:  Daily care of your teeth and gums.  Regular physical activity.  Eating a healthy diet.  Avoiding tobacco and drug use.  Limiting alcohol use.  Practicing safe sex.  Taking low-dose aspirin every day starting at age 67. What happens during an annual well check? The services and screenings done by your health care provider during your annual well check will depend on your age, overall health, lifestyle risk factors, and family history of disease. Counseling  Your health care provider may ask you questions about your:  Alcohol use.  Tobacco use.  Drug use.  Emotional well-being.  Home and relationship well-being.  Sexual activity.  Eating habits.  Work and work Astron[employer]. Screening   You may have the following tests or measurements:  Height, weight, and BMI.  Blood pressure.  Lipid and cholesterol levels. These may be checked every 5 years, or more frequently if you are over 36 years old.  Skin check.  Lung cancer screening. You may have this screening every year starting at age 60 if you have a 30-pack-year history of smoking and currently smoke or have quit within the past 15 years.  Fecal occult blood test (FOBT) of the stool. You may have this test every year starting at age 32.  Flexible sigmoidoscopy or colonoscopy. You may have a sigmoidoscopy every 5 years or a colonoscopy every 10 years starting at age 8.  Prostate cancer screening. Recommendations will vary depending on your family history and other risks.  Hepatitis C blood test.  Hepatitis B blood test.  Sexually transmitted disease (STD) testing.  Diabetes screening. This is done by checking your blood sugar (glucose) after you have not eaten for a while (fasting). You may have this done every 1-3 years. Discuss your test results, treatment options, and if necessary, the need for more tests with your health care provider. Vaccines  Your health care provider may recommend certain vaccines, such as:  Influenza vaccine. This is recommended every year.  Tetanus, diphtheria, and acellular pertussis (Tdap, Td) vaccine. You may need a Td booster every 10 years.  Zoster vaccine. You may need this after age 59.  Pneumococcal 13-valent conjugate (PCV13) vaccine. You may need this if you have certain conditions and have not been vaccinated.  Pneumococcal polysaccharide (PPSV23) vaccine. You may need one or two doses if you smoke cigarettes or if you have certain conditions. Talk to your health care provider about  which screenings and vaccines you need and how often you need them. This information is not intended to replace advice given to you by your health care provider. Make sure you discuss any  questions you have with your health care provider. Document Released: 10/24/2015 Document Revised: 06/16/2016 Document Reviewed: 07/29/2015 Elsevier Interactive Patient Education  2017 [employer].  Fall Prevention in the Home Falls can cause injuries. They can happen to people of all ages. There are many things you can do to make your home safe and to help prevent falls. What can I do on the outside of my home?  Regularly fix the edges of walkways and driveways and fix any cracks.  Remove anything that might make you trip as you walk through a door, such as a raised step or threshold.  Trim any bushes or trees on the path to your home.  Use bright outdoor lighting.  Clear any walking paths of anything that might make someone trip, such as rocks or tools.  Regularly check to see if handrails are loose or broken. Make sure that both sides of any steps have handrails.  Any raised decks and porches should have guardrails on the edges.  Have any leaves, snow, or ice cleared regularly.  Use sand or salt on walking paths during winter.  Clean up any spills in your garage right away. This includes oil or grease spills. What can I do in the bathroom?  Use night lights.  Install grab bars by the toilet and in the tub and shower. Do not use towel bars as grab bars.  Use non-skid mats or decals in the tub or shower.  If you need to sit down in the shower, use a plastic, non-slip stool.  Keep the floor dry. Clean up any water that spills on the floor as soon as it happens.  Remove soap buildup in the tub or shower regularly.  Attach bath mats securely with double-sided non-slip rug tape.  Do not have throw rugs and other things on the floor that can make you trip. What can I do in the bedroom?  Use night lights.  Make sure that you have a light by your bed that is easy to reach.  Do not use any sheets or blankets that are too big for your bed. They should not hang down onto  the floor.  Have a firm chair that has side arms. You can use this for support while you get dressed.  Do not have throw rugs and other things on the floor that can make you trip. What can I do in the kitchen?  Clean up any spills right away.  Avoid walking on wet floors.  Keep items that you use a lot in easy-to-reach places.  If you need to reach something above you, use a strong step stool that has a grab bar.  Keep electrical cords out of the way.  Do not use floor polish or wax that makes floors slippery. If you must use wax, use non-skid floor wax.  Do not have throw rugs and other things on the floor that can make you trip. What can I do with my stairs?  Do not leave any items on the stairs.  Make sure that there are handrails on both sides of the stairs and use them. Fix handrails that are broken or loose. Make sure that handrails are as long as the stairways.  Check any carpeting to make sure that it is firmly attached to the  stairs. Fix any carpet that is loose or worn.  Avoid having throw rugs at the top or bottom of the stairs. If you do have throw rugs, attach them to the floor with carpet tape.  Make sure that you have a light switch at the top of the stairs and the bottom of the stairs. If you do not have them, ask someone to add them for you. What else can I do to help prevent falls?  Wear shoes that:  Do not have high heels.  Have rubber bottoms.  Are comfortable and fit you well.  Are closed at the toe. Do not wear sandals.  If you use a stepladder:  Make sure that it is fully opened. Do not climb a closed stepladder.  Make sure that both sides of the stepladder are locked into place.  Ask someone to hold it for you, if possible.  Clearly mark and make sure that you can see:  Any grab bars or handrails.  First and last steps.  Where the edge of each step is.  Use tools that help you move around (mobility aids) if they are needed. These  include:  Canes.  Walkers.  Scooters.  Crutches.  Turn on the lights when you go into a dark area. Replace any light bulbs as soon as they burn out.  Set up your furniture so you have a clear path. Avoid moving your furniture around.  If any of your floors are uneven, fix them.  If there are any pets around you, be aware of where they are.  Review your medicines with your doctor. Some medicines can make you feel dizzy. This can increase your chance of falling. Ask your doctor what other things that you can do to help prevent falls. This information is not intended to replace advice given to you by your health care provider. Make sure you discuss any questions you have with your health care provider. Document Released: 07/24/2009 Document Revised: 03/04/2016 Document Reviewed: 11/01/2014 Elsevier Interactive Patient Education  2017 [employer].

## 2019-01-08 ENCOUNTER — Other Ambulatory Visit: Payer: Self-pay | Admitting: Internal Medicine

## 2019-01-08 NOTE — Telephone Encounter (Signed)
Lyrica refill

## 2019-01-25 NOTE — Progress Notes (Signed)
This nurse spoke to the patient [name] via telephone. Patient was at his home. Nurse was at the [facility] office.

## 2019-01-26 ENCOUNTER — Other Ambulatory Visit: Payer: Self-pay | Admitting: Internal Medicine

## 2019-02-07 ENCOUNTER — Ambulatory Visit: Payer: Medicare Other | Admitting: Internal Medicine

## 2019-03-19 ENCOUNTER — Other Ambulatory Visit: Payer: Self-pay

## 2019-03-19 ENCOUNTER — Ambulatory Visit (INDEPENDENT_AMBULATORY_CARE_PROVIDER_SITE_OTHER): Payer: Medicare Other | Admitting: Internal Medicine

## 2019-03-19 ENCOUNTER — Encounter: Payer: Self-pay | Admitting: Internal Medicine

## 2019-03-19 VITALS — BP 118/70 | HR 63 | Temp 98.1°F | Ht 66.4 in | Wt 165.0 lb

## 2019-03-19 DIAGNOSIS — Z6826 Body mass index (BMI) 26.0-26.9, adult: Secondary | ICD-10-CM

## 2019-03-19 DIAGNOSIS — I119 Hypertensive heart disease without heart failure: Secondary | ICD-10-CM

## 2019-03-19 DIAGNOSIS — E1141 Type 2 diabetes mellitus with diabetic mononeuropathy: Secondary | ICD-10-CM

## 2019-03-19 DIAGNOSIS — I2510 Atherosclerotic heart disease of native coronary artery without angina pectoris: Secondary | ICD-10-CM

## 2019-03-19 MED ORDER — ACCU-CHEK GUIDE ME W/DEVICE KIT: 1.0000 | PACK | Freq: Two times a day (BID) | 1 refills | Status: DC

## 2019-03-19 NOTE — Progress Notes (Signed)
Subjective:     Patient ID: Steven [name] , male    DOB: [birth year] , 63 y.o.   MRN: [id number]   Chief Complaint  Patient presents with  . Diabetes    113 blood sugar  . Hypertension    HPI  Diabetes  He presents for his follow-up diabetic visit. He has type 2 diabetes mellitus. There are no hypoglycemic associated symptoms. Pertinent negatives for diabetes include no blurred vision and no chest pain. There are no hypoglycemic complications. Risk factors for coronary artery disease include diabetes mellitus, dyslipidemia, male sex and hypertension. Current diabetic treatment includes diet.  Hypertension  This is a chronic problem. The current episode started more than 1 year ago. Pertinent negatives include no blurred vision or chest pain. Hypertensive end-organ damage includes CAD/MI.     Past Medical History:  Diagnosis Date  . 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 CAD (coronary artery disease), known LAD stent placed in 2007 in [place]. ,last cath 2010 with patent stent and nonobstructive CAD 12/18/2011  . Chest pain at rest 12/18/2011  . Diabetes mellitus   . DM (diabetes mellitus) ([place]) 12/18/2011  . Dyslipidemia 12/18/2011  . 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 History of corneal transplant, lt. eye with blurred vision 12/18/2011  . HTN (hypertension) 12/18/2011  . Hypertension      Family History  Problem Relation Age of Onset  . Heart attack Mother   . Diabetes Mother   . Hypertension Father      Current Outpatient Medications:  .  ACCU-CHEK FASTCLIX LANCETS MISC, by Does not apply route., Disp: , Rfl:  .  aspirin EC 81 MG tablet, Take 81 mg by mouth daily., Disp: , Rfl:  .  atorvastatin (LIPITOR) 10 MG tablet, Take 1 tablet by mouth daily., Disp: , Rfl: 2 .  cycloSPORINE (SANDIMMUNE) 25 MG capsule, Take 75 mg by mouth 2 (two) times daily., Disp: , Rfl:  .  folic acid (FOLVITE) 1 MG tablet, Take 1 mg by mouth daily., Disp: , Rfl:  .  glucose blood (ACCU-CHEK GUIDE) test strip, 1  each by Other route as needed for other. Use as instructed, Disp: , Rfl:  .  LINZESS 290 MCG CAPS capsule, Take 290 mcg by mouth daily., Disp: , Rfl:  .  lisinopril (ZESTRIL) 10 MG tablet, TAKE 1 TABLET BY MOUTH ONE TIME DAILY, Disp: 90 tablet, Rfl: 1 .  methotrexate (RHEUMATREX) 2.5 MG tablet, Take 12.5 mg by mouth once a week. Fridays. Caution:Chemotherapy. Protect from light., Disp: , Rfl:  .  prednisoLONE acetate (PRED FORTE) 1 % ophthalmic suspension, Place 1 drop into the left eye daily., Disp: , Rfl:  .  pregabalin (LYRICA) 25 MG capsule, TAKE 1 CAPSULE BY MOUTH TWICE DAILY, Disp: 60 capsule, Rfl: 5 .  traMADol (ULTRAM) 50 MG tablet, Take 1 tablet (50 mg total) by mouth every 6 (six) hours as needed., Disp: 15 tablet, Rfl: 0 .  Blood Glucose Monitoring Suppl (ACCU-CHEK GUIDE ME) w/Device KIT, Inject 1 kit into the skin 2 (two) times a day. Dx: e11.22, Disp: 1 kit, Rfl: 1   No Known Allergies   Review of Systems  Constitutional: Negative.   Eyes: Negative for blurred vision.  Respiratory: Negative.   Cardiovascular: Negative.  Negative for chest pain.  Gastrointestinal: Negative.   Neurological: Negative.   Psychiatric/Behavioral: Negative.      Today's Vitals   03/19/19 1132  BP: 118/70  Pulse: 63  Temp: 98.1 F (36.7 C)  TempSrc: Oral  Weight: 165 lb (74.8 kg)  Height: 5' 6.4" (1.687 m)  PainSc: 0-No pain   Body mass index is 26.31 kg/m.   Objective:  Physical Exam Vitals signs and nursing note reviewed.  Constitutional:      Appearance: Normal appearance.  Cardiovascular:     Rate and Rhythm: Normal rate and regular rhythm.     Heart sounds: Normal heart sounds.  Pulmonary:     Effort: Pulmonary effort is normal.     Breath sounds: Normal breath sounds.  Skin:    General: Skin is warm.  Neurological:     General: No focal deficit present.     Mental Status: He is alert.  Psychiatric:        Mood and Affect: Mood normal.         Assessment And Plan:      1. Diabetic mononeuropathy associated with type 2 diabetes mellitus ([place])  I will check labs as listed below.  He is not on any medication at this time. He has done well controlling his sugars with diet/exercise. He will rto in 4 months for re-evaluation.   - Hemoglobin A1c - BMP8+EGFR  2. Benign hypertensive heart disease without heart failure  Well controlled.  He will continue with current meds. He is encouraged to avoid adding salt to his foods.   3. Atherosclerosis of native coronary artery of native heart without angina pectoris  Chronic, yet stable. Importance of medication, dietary and exercise compliance was discussed with the patient.   4. Body mass index (BMI) of 26.0 to 26.9 in adult  His weight is stable. He is encouraged to exercise no less than 150 minutes per week.   [name], MD    THE PATIENT IS ENCOURAGED TO PRACTICE SOCIAL DISTANCING DUE TO THE COVID-19 PANDEMIC.

## 2019-03-19 NOTE — Patient Instructions (Signed)
Diabetic Neuropathy  Diabetic neuropathy refers to nerve damage that is caused by diabetes (diabetes mellitus). Over time, people with diabetes can develop nerve damage throughout the body. There are several types of diabetic neuropathy:  · Peripheral neuropathy. This is the most common type of diabetic neuropathy. It causes damage to nerves that carry signals between the spinal cord and other parts of the body (peripheral nerves). This usually affects nerves in the feet and legs first, and may eventually affect the hands and arms. The damage affects the ability to sense touch or temperature.  · Autonomic neuropathy. This type causes damage to nerves that control involuntary functions (autonomic nerves). These nerves carry signals that control:  ? Heartbeat.  ? Body temperature.  ? Blood pressure.  ? Urination.  ? Digestion.  ? Sweating.  ? Sexual function.  ? Response to changing blood sugar (glucose) levels.  · Focal neuropathy. This type of nerve damage affects one area of the body, such as an arm, a leg, or the face. The injury may involve one nerve or a small group of nerves. Focal neuropathy can be painful and unpredictable, and occurs most often in older adults with diabetes. This often develops suddenly, but usually improves over time and does not cause long-term problems.  · Proximal neuropathy. This type of nerve damage affects the nerves of the thighs, hips, buttocks, or legs. It causes severe pain, weakness, and muscle death (atrophy), usually in the thigh muscles. It is more common among older men and people who have type 2 diabetes. The length of recovery time may vary.  What are the causes?  Peripheral, autonomic, and focal neuropathies are caused by diabetes that is not well controlled with treatment. The cause of proximal neuropathy is not known, but it may be caused by inflammation related to uncontrolled blood glucose levels.  What are the signs or symptoms?  Peripheral neuropathy  Peripheral  neuropathy develops slowly over time. When the nerves of the feet and legs no longer work, you may experience:  · Burning, stabbing, or aching pain in the legs or feet.  · Pain or cramping in the legs or feet.  · Loss of feeling (numbness) and inability to feel pressure or pain in the feet. This can lead to:  ? Thick calluses or sores on areas of constant pressure.  ? Ulcers.  ? Reduced ability to feel temperature changes.  · Foot deformities.  · Muscle weakness.  · Loss of balance or coordination.  Autonomic neuropathy  The symptoms of autonomic neuropathy vary depending on which nerves are affected. Symptoms may include:  · Problems with digestion, such as:  ? Nausea or vomiting.  ? Poor appetite.  ? Bloating.  ? Diarrhea or constipation.  ? Trouble swallowing.  ? Losing weight without trying to.  · Problems with the heart, blood and lungs, such as:  ? Dizziness, especially when standing up.  ? Fainting.  ? Shortness of breath.  ? Irregular heartbeat.  · Bladder problems, such as:  ? Trouble starting or stopping urination.  ? Leaking urine.  ? Trouble emptying the bladder.  ? Urinary tract infections (UTIs).  · Problems with other body functions, such as:  ? Sweat. You may sweat too much or too little.  ? Temperature. You might get hot easily. Or, you might feel cold more than usual.  ? Sexual function. Men may not be able to get or maintain an erection. Women may have vaginal dryness and difficulty with arousal.    Focal neuropathy  Symptoms affect only one area of the body. Common symptoms include:  · Numbness.  · Tingling.  · Burning pain.  · Prickling feeling.  · Very sensitive skin.  · Weakness.  · Inability to move (paralysis).  · Muscle twitching.  · Muscles getting smaller (wasting).  · Poor coordination.  · Double or blurred vision.  Proximal neuropathy  · Sudden, severe pain in the hip, thigh, or buttocks. Pain may spread from the back into the legs (sciatica).  · Pain and numbness in the arms and  legs.  · Tingling.  · Loss of bladder control or bowel control.  · Weakness and wasting of thigh muscles.  · Difficulty getting up from a seated position.  · Abdominal swelling.  · Unexplained weight loss.  How is this diagnosed?  Diagnosis usually involves reviewing your medical history and any symptoms you have. Diagnosis varies depending on the type of neuropathy your health care provider suspects.  Peripheral neuropathy  Your health care provider will check areas that are affected by your nervous system (neurologic exam), such as your reflexes, how you move, and what you can feel. You may have other tests, such as:  · Blood tests.  · Removal and examination of fluid that surrounds the spinal cord (lumbar puncture).  · CT scan.  · MRI.  · A test to check the nerves that control muscles (electromyogram, EMG).  · Tests of how quickly messages pass through your nerves (nerve conduction velocity tests).  · Removal of a small piece of nerve to be examined under a microscope (biopsy).  Autonomic neuropathy  You may have tests, such as:  · Tests to measure your blood pressure and heart rate. This may include monitoring you while you are safely secured to an exam table that moves you from a lying position to an upright position (table tilt test).  · Breathing tests to check your lungs.  · Tests to check how food moves through the digestive system (gastric emptying tests).  · Blood, sweat, or urine tests.  · Ultrasound of your bladder.  · Spinal fluid tests.  Focal neuropathy  This condition may be diagnosed with:  · A neurologic exam.  · CT scan.  · MRI.  · EMG.  · Nerve conduction velocity tests.  Proximal neuropathy  There is no test to diagnose this type of neuropathy. You may have tests to rule out other possible causes of this type of neuropathy. Tests may include:  · X-rays of your spine and lumbar region.  · Lumbar puncture.  · MRI.  How is this treated?  The goal of treatment is to keep nerve damage from getting  worse. The most important part of treatment is keeping your blood glucose level and your A1C level within your target range by following your diabetes management plan. Over time, maintaining lower blood glucose levels helps lessen symptoms. In some cases, you may need prescription pain medicine.  Follow these instructions at home:    Lifestyle    · Do not use any products that contain nicotine or tobacco, such as cigarettes and e-cigarettes. If you need help quitting, ask your health care provider.  · Be physically active every day. Include strength training and balance exercises.  · Follow a healthy meal plan.  · Work with your health care provider to manage your blood pressure.  General instructions  · Follow your diabetes management plan as directed.  ? Check your blood glucose levels as directed   by your health care provider.  ? Keep your blood glucose in your target range as directed by your health care provider.  ? Have your A1C level checked at least two times a year, or as often as told by your health care provider.  · Take over the counter and prescription medicines only as told by your health care provider. This includes insulin and diabetes medicine.  · Do not drive or use heavy machinery while taking prescription pain medicines.  · Check your skin and feet every day for cuts, bruises, redness, blisters, or sores.  · Keep all follow up visits as told by your health care provider. This is important.  Contact a health care provider if:  · You have burning, stabbing, or aching pain in your legs or feet.  · You are unable to feel pressure or pain in your feet.  · You develop problems with digestion, such as:  ? Nausea.  ? Vomiting.  ? Bloating.  ? Constipation.  ? Diarrhea.  ? Abdominal pain.  · You have difficulty with urination, such as inability:  ? To control when you urinate (incontinence).  ? To completely empty the bladder (retention).  · You have palpitations.  · You feel dizzy, weak, or faint when you  stand up.  Get help right away if:  · You cannot urinate.  · You have sudden weakness or loss of coordination.  · You have trouble speaking.  · You have pain or pressure in your chest.  · You have an irregular heart beat.  · You have sudden inability to move a part of your body.  Summary  · Diabetic neuropathy refers to nerve damage that is caused by diabetes. It can affect nerves throughout the entire body, causing numbness and pain in the arms, legs, digestive tract, heart, and other body systems.  · Keep your blood glucose level and your blood pressure in your target range, as directed by your health care provider. This can help prevent neuropathy from getting worse.  · Check your skin and feet every day for cuts, bruises, redness, blisters, or sores.  · Do not use any products that contain nicotine or tobacco, such as cigarettes and e-cigarettes. If you need help quitting, ask your health care provider.  This information is not intended to replace advice given to you by your health care provider. Make sure you discuss any questions you have with your health care provider.  Document Released: 12/06/2001 Document Revised: 11/09/2017 Document Reviewed: 11/01/2016  Elsevier Interactive Patient Education © 2019 [employer]

## 2019-03-20 DIAGNOSIS — H3581 Retinal edema: Secondary | ICD-10-CM | POA: Diagnosis not present

## 2019-03-20 DIAGNOSIS — H44521 Atrophy of globe, right eye: Secondary | ICD-10-CM | POA: Diagnosis not present

## 2019-03-20 DIAGNOSIS — Z79899 Other long term (current) drug therapy: Secondary | ICD-10-CM | POA: Diagnosis not present

## 2019-03-20 DIAGNOSIS — H30033 Focal chorioretinal inflammation, peripheral, bilateral: Secondary | ICD-10-CM | POA: Diagnosis not present

## 2019-03-20 DIAGNOSIS — Z947 Corneal transplant status: Secondary | ICD-10-CM | POA: Diagnosis not present

## 2019-03-20 DIAGNOSIS — H35033 Hypertensive retinopathy, bilateral: Secondary | ICD-10-CM | POA: Diagnosis not present

## 2019-03-20 LAB — BMP8+EGFR
BUN/Creatinine Ratio: 13 (ref 10–24)
BUN: 14 mg/dL (ref 8–27)
CO2: 22 mmol/L (ref 20–29)
Calcium: 9.9 mg/dL (ref 8.6–10.2)
Chloride: 105 mmol/L (ref 96–106)
Creatinine, Ser: 1.11 mg/dL (ref 0.76–1.27)
GFR calc Af Amer: 82 mL/min/{1.73_m2} (ref >59)
GFR calc non Af Amer: 71 mL/min/{1.73_m2} (ref >59)
Glucose: 117 mg/dL — ABNORMAL HIGH (ref 65–99)
Potassium: 5 mmol/L (ref 3.5–5.2)
Sodium: 140 mmol/L (ref 134–144)

## 2019-03-20 LAB — HEMOGLOBIN A1C
Est. average glucose Bld gHb Est-mCnc: 128 mg/dL
Hgb A1c MFr Bld: 6.1 % — ABNORMAL HIGH (ref 4.8–5.6)

## 2019-04-29 ENCOUNTER — Other Ambulatory Visit: Payer: Self-pay | Admitting: Internal Medicine

## 2019-06-04 DIAGNOSIS — H44521 Atrophy of globe, right eye: Secondary | ICD-10-CM | POA: Diagnosis not present

## 2019-06-04 DIAGNOSIS — Z947 Corneal transplant status: Secondary | ICD-10-CM | POA: Diagnosis not present

## 2019-06-04 DIAGNOSIS — H3581 Retinal edema: Secondary | ICD-10-CM | POA: Diagnosis not present

## 2019-06-04 DIAGNOSIS — H35033 Hypertensive retinopathy, bilateral: Secondary | ICD-10-CM | POA: Diagnosis not present

## 2019-06-07 DIAGNOSIS — H4042X2 Glaucoma secondary to eye inflammation, left eye, moderate stage: Secondary | ICD-10-CM | POA: Diagnosis not present

## 2019-06-07 DIAGNOSIS — H209 Unspecified iridocyclitis: Secondary | ICD-10-CM | POA: Diagnosis not present

## 2019-07-03 DIAGNOSIS — H35033 Hypertensive retinopathy, bilateral: Secondary | ICD-10-CM | POA: Diagnosis not present

## 2019-07-03 DIAGNOSIS — Z947 Corneal transplant status: Secondary | ICD-10-CM | POA: Diagnosis not present

## 2019-07-03 DIAGNOSIS — H3581 Retinal edema: Secondary | ICD-10-CM | POA: Diagnosis not present

## 2019-07-03 DIAGNOSIS — Z79899 Other long term (current) drug therapy: Secondary | ICD-10-CM | POA: Diagnosis not present

## 2019-07-03 DIAGNOSIS — H44521 Atrophy of globe, right eye: Secondary | ICD-10-CM | POA: Diagnosis not present

## 2019-07-03 DIAGNOSIS — H30033 Focal chorioretinal inflammation, peripheral, bilateral: Secondary | ICD-10-CM | POA: Diagnosis not present

## 2019-07-13 ENCOUNTER — Emergency Department (HOSPITAL_COMMUNITY): Payer: Medicare Other

## 2019-07-13 ENCOUNTER — Encounter (HOSPITAL_COMMUNITY): Payer: Self-pay

## 2019-07-13 ENCOUNTER — Emergency Department (HOSPITAL_COMMUNITY)
Admission: EM | Admit: 2019-07-13 | Discharge: 2019-07-14 | Disposition: A | Discharge status: Home / Self Care | Payer: Medicare Other | Attending: Emergency Medicine | Admitting: Emergency Medicine

## 2019-07-13 ENCOUNTER — Other Ambulatory Visit: Payer: Self-pay

## 2019-07-13 DIAGNOSIS — Y92410 Unspecified street and highway as the place of occurrence of the external cause: Secondary | ICD-10-CM | POA: Diagnosis not present

## 2019-07-13 DIAGNOSIS — H548 Legal blindness, as defined in USA: Secondary | ICD-10-CM | POA: Diagnosis not present

## 2019-07-13 DIAGNOSIS — I2510 Atherosclerotic heart disease of native coronary artery without angina pectoris: Secondary | ICD-10-CM | POA: Insufficient documentation

## 2019-07-13 DIAGNOSIS — M25512 Pain in left shoulder: Secondary | ICD-10-CM | POA: Diagnosis not present

## 2019-07-13 DIAGNOSIS — I10 Essential (primary) hypertension: Secondary | ICD-10-CM | POA: Diagnosis not present

## 2019-07-13 DIAGNOSIS — Z87891 Personal history of nicotine dependence: Secondary | ICD-10-CM | POA: Diagnosis not present

## 2019-07-13 DIAGNOSIS — M7918 Myalgia, other site: Secondary | ICD-10-CM | POA: Insufficient documentation

## 2019-07-13 DIAGNOSIS — Z955 Presence of coronary angioplasty implant and graft: Secondary | ICD-10-CM | POA: Diagnosis not present

## 2019-07-13 DIAGNOSIS — R52 Pain, unspecified: Secondary | ICD-10-CM | POA: Diagnosis not present

## 2019-07-13 DIAGNOSIS — S4992XA Unspecified injury of left shoulder and upper arm, initial encounter: Secondary | ICD-10-CM | POA: Diagnosis not present

## 2019-07-13 DIAGNOSIS — Z7982 Long term (current) use of aspirin: Secondary | ICD-10-CM | POA: Insufficient documentation

## 2019-07-13 DIAGNOSIS — E1140 Type 2 diabetes mellitus with diabetic neuropathy, unspecified: Secondary | ICD-10-CM | POA: Diagnosis not present

## 2019-07-13 DIAGNOSIS — M542 Cervicalgia: Secondary | ICD-10-CM | POA: Diagnosis not present

## 2019-07-13 DIAGNOSIS — S161XXA Strain of muscle, fascia and tendon at neck level, initial encounter: Secondary | ICD-10-CM | POA: Diagnosis not present

## 2019-07-13 DIAGNOSIS — Y999 Unspecified external cause status: Secondary | ICD-10-CM | POA: Diagnosis not present

## 2019-07-13 DIAGNOSIS — S199XXA Unspecified injury of neck, initial encounter: Secondary | ICD-10-CM | POA: Diagnosis present

## 2019-07-13 DIAGNOSIS — Y9389 Activity, other specified: Secondary | ICD-10-CM | POA: Insufficient documentation

## 2019-07-13 MED ORDER — METHOCARBAMOL 500 MG PO TABS
500.0000 mg | ORAL_TABLET | Freq: Once | ORAL | Status: AC
  Administered 2019-07-13: 500 mg via ORAL
  Filled 2019-07-13: qty 1
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
## 2019-07-14 DIAGNOSIS — S161XXA Strain of muscle, fascia and tendon at neck level, initial encounter: Secondary | ICD-10-CM | POA: Diagnosis not present

## 2019-07-14 MED ORDER — METHOCARBAMOL 500 MG PO TABS: 500.0000 mg | ORAL_TABLET | Freq: Four times a day (QID) | ORAL | 0 refills | Status: DC
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
## 2019-08-13 ENCOUNTER — Encounter: Payer: Medicare Other | Admitting: Internal Medicine

## 2019-08-21 ENCOUNTER — Encounter: Payer: Self-pay | Admitting: Internal Medicine

## 2019-08-21 ENCOUNTER — Other Ambulatory Visit: Payer: Self-pay

## 2019-08-21 ENCOUNTER — Ambulatory Visit (INDEPENDENT_AMBULATORY_CARE_PROVIDER_SITE_OTHER): Payer: Medicare Other | Admitting: Internal Medicine

## 2019-08-21 VITALS — BP 118/72 | HR 62 | Temp 98.4°F | Ht 62.0 in | Wt 162.0 lb

## 2019-08-21 DIAGNOSIS — I119 Hypertensive heart disease without heart failure: Secondary | ICD-10-CM | POA: Insufficient documentation

## 2019-08-21 DIAGNOSIS — I2510 Atherosclerotic heart disease of native coronary artery without angina pectoris: Secondary | ICD-10-CM | POA: Insufficient documentation

## 2019-08-21 DIAGNOSIS — N400 Enlarged prostate without lower urinary tract symptoms: Secondary | ICD-10-CM

## 2019-08-21 DIAGNOSIS — E1141 Type 2 diabetes mellitus with diabetic mononeuropathy: Secondary | ICD-10-CM

## 2019-08-21 DIAGNOSIS — Z1211 Encounter for screening for malignant neoplasm of colon: Secondary | ICD-10-CM

## 2019-08-21 DIAGNOSIS — Z0000 Encounter for general adult medical examination without abnormal findings: Secondary | ICD-10-CM

## 2019-08-21 DIAGNOSIS — I2584 Coronary atherosclerosis due to calcified coronary lesion: Secondary | ICD-10-CM | POA: Insufficient documentation

## 2019-08-21 LAB — POCT UA - MICROALBUMIN
Albumin/Creatinine Ratio, Urine, POC: <30
Creatinine, POC: 200 mg/dL
Microalbumin Ur, POC: 10 mg/L

## 2019-08-21 LAB — POCT URINALYSIS DIPSTICK
Bilirubin, UA: NEGATIVE
Blood, UA: NEGATIVE
Glucose, UA: NEGATIVE
Ketones, UA: NEGATIVE
Leukocytes, UA: NEGATIVE
Nitrite, UA: NEGATIVE
Protein, UA: NEGATIVE
Spec Grav, UA: 1.02 (ref 1.010–1.025)
Urobilinogen, UA: 0.2 E.U./dL
pH, UA: 5.5 (ref 5.0–8.0)

## 2019-08-21 LAB — POC HEMOCCULT BLD/STL (OFFICE/1-CARD/DIAGNOSTIC): Fecal Occult Blood, POC: NEGATIVE

## 2019-08-21 NOTE — Patient Instructions (Signed)

## 2019-08-21 NOTE — Progress Notes (Addendum)
Subjective:     Patient ID: Steven [name] , male    DOB: 04/03/1956 , 63 y.o.   MRN: [id number]   Chief Complaint  Patient presents with  . Annual Exam  . Diabetes  . Hypertension    HPI  He is here today for a full physical exam. He has no specific concerns or complaints at this time.   Diabetes He presents for his follow-up diabetic visit. He has type 2 diabetes mellitus. There are no hypoglycemic associated symptoms. Pertinent negatives for diabetes include no blurred vision and no chest pain. There are no hypoglycemic complications. Risk factors for coronary artery disease include diabetes mellitus, dyslipidemia, male sex and hypertension. Current diabetic treatment includes diet. He is following a diabetic diet. He participates in exercise daily.  Hypertension This is a chronic problem. The current episode started more than 1 year ago. Pertinent negatives include no blurred vision or chest pain. The current treatment provides moderate improvement. There are no compliance problems.  Hypertensive end-organ damage includes CAD/MI.     Past Medical History:  Diagnosis Date  . 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 CAD (coronary artery disease), known LAD stent placed in 2007 in [place] ,last cath 2010 with patent stent and nonobstructive CAD 12/18/2011  . Chest pain at rest 12/18/2011  . Diabetes mellitus   . DM (diabetes mellitus) ([facility]) 12/18/2011  . Dyslipidemia 12/18/2011  . 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 History of corneal transplant, lt. eye with blurred vision 12/18/2011  . HTN (hypertension) 12/18/2011  . Hypertension      Family History  Problem Relation Age of Onset  . Heart attack Mother   . Diabetes Mother   . Hypertension Father      Current Outpatient Medications:  .  ACCU-CHEK FASTCLIX LANCETS MISC, by Does not apply route., Disp: , Rfl:  .  aspirin EC 81 MG tablet, Take 81 mg by mouth daily., Disp: , Rfl:  .  atorvastatin (LIPITOR) 10 MG tablet, TAKE 1 TABLET BY MOUTH  ONE TIME DAILY, Disp: 90 tablet, Rfl: 1 .  Blood Glucose Monitoring Suppl (ACCU-CHEK GUIDE ME) w/Device KIT, Inject 1 kit into the skin 2 (two) times a day. Dx: e11.22, Disp: 1 kit, Rfl: 1 .  cycloSPORINE (SANDIMMUNE) 25 MG capsule, Take 75 mg by mouth 2 (two) times daily., Disp: , Rfl:  .  dorzolamide-timolol (COSOPT) 22.3-6.8 MG/ML ophthalmic solution, Place 1 drop into the left eye 2 times daily., Disp: , Rfl:  .  folic acid (FOLVITE) 1 MG tablet, Take 1 mg by mouth daily., Disp: , Rfl:  .  glucose blood (ACCU-CHEK GUIDE) test strip, 1 each by Other route as needed for other. Use as instructed, Disp: , Rfl:  .  latanoprost (XALATAN) 0.005 % ophthalmic solution, PLACE 1 GTT INTO THE LEFT EYE NIGHTLY, Disp: , Rfl:  .  LINZESS 290 MCG CAPS capsule, Take 290 mcg by mouth daily., Disp: , Rfl:  .  lisinopril (ZESTRIL) 10 MG tablet, TAKE 1 TABLET BY MOUTH ONE TIME DAILY, Disp: 90 tablet, Rfl: 1 .  methotrexate (RHEUMATREX) 2.5 MG tablet, Take 10 mg by mouth once a week. Fridays. Caution:Chemotherapy. Protect from light. , Disp: , Rfl:  .  prednisoLONE acetate (PRED FORTE) 1 % ophthalmic suspension, Place 1 drop into the left eye daily., Disp: , Rfl:  .  pregabalin (LYRICA) 25 MG capsule, TAKE 1 CAPSULE BY MOUTH TWICE DAILY, Disp: 60 capsule, Rfl: 5   No Known Allergies     Men's preventive visit. Patient Health Questionnaire (PHQ-2) is    Office Visit from 03/19/2019 in [facility]  PHQ-2 Total Score  0    . Patient is on a healthy diet. Marital status: Divorced. Relevant history for alcohol use is:  Social History   Substance and Sexual Activity  Alcohol Use No  . Alcohol/week: 0.0 standard drinks  . Relevant history for tobacco use is:  Social History   Tobacco Use  Smoking Status Former Smoker  . Packs/day: 0.75  . Years: 8.00  . Pack years: 6.00  . Types: Cigarettes  . Quit date: 10/11/2001  . Years since quitting: 17.9  Smokeless Tobacco Never Used  .  Review  of Systems  Constitutional: Negative.   HENT: Negative.   Eyes: Negative.  Negative for blurred vision.  Respiratory: Negative.   Cardiovascular: Negative.  Negative for chest pain.  Endocrine: Negative.   Genitourinary: Negative.   Musculoskeletal: Negative.   Skin: Negative.   Allergic/Immunologic: Negative.   Neurological: Negative.   Hematological: Negative.   Psychiatric/Behavioral: Negative.      Today's Vitals   08/21/19 1012  Pulse: 62  Temp: 98.4 F (36.9 C)  TempSrc: Oral  Weight: 162 lb (73.5 kg)  Height: 5' 2" (1.575 m)  PainSc: 0-No pain   Body mass index is 29.63 kg/m.   Objective:  Physical Exam Vitals signs and nursing note reviewed.  Constitutional:      Appearance: Normal appearance.  HENT:     Head: Normocephalic and atraumatic.     Comments: Cloudy sclerae b/l, left eye with surgical changes    Right Ear: Tympanic membrane, ear canal and external ear normal.     Left Ear: Tympanic membrane, ear canal and external ear normal.     Nose: Nose normal.     Mouth/Throat:     Mouth: Mucous membranes are moist.     Pharynx: Oropharynx is clear.  Eyes:     Extraocular Movements: Extraocular movements intact.     Conjunctiva/sclera: Conjunctivae normal.     Pupils: Pupils are equal, round, and reactive to light.  Neck:     Musculoskeletal: Normal range of motion and neck supple.  Cardiovascular:     Rate and Rhythm: Normal rate and regular rhythm.     Pulses: Normal pulses.          Dorsalis pedis pulses are 2+ on the right side and 2+ on the left side.     Heart sounds: Normal heart sounds.  Pulmonary:     Effort: Pulmonary effort is normal.     Breath sounds: Normal breath sounds.  Chest:     Breasts:        Right: Normal. No swelling, bleeding, inverted nipple, mass or nipple discharge.        Left: Normal. No swelling, bleeding, inverted nipple, mass or nipple discharge.  Abdominal:     General: Abdomen is flat. Bowel sounds are normal.      Palpations: Abdomen is soft.  Genitourinary:    Prostate: Normal.     Rectum: Normal. Guaiac result negative.  Musculoskeletal: Normal range of motion.  Feet:     Right foot:     Protective Sensation: 5 sites tested. 5 sites sensed.     Skin integrity: Skin integrity normal.     Toenail Condition: Right toenails are normal.     Left foot:     Protective Sensation: 5 sites tested. 5 sites sensed.     Skin   integrity: Skin integrity normal.     Toenail Condition: Left toenails are normal.  Skin:    General: Skin is warm.  Neurological:     General: No focal deficit present.     Mental Status: He is alert.  Psychiatric:        Mood and Affect: Mood normal.        Behavior: Behavior normal.         Assessment And Plan:        1. Routine general medical examination at health care facility  A full exam was performed.  Importance of monthly self breast exams was discussed with the patient. PATIENT HAS BEEN ADVISED TO GET 30-45 MINUTES REGULAR EXERCISE NO LESS THAN FOUR TO FIVE DAYS PER WEEK - BOTH WEIGHTBEARING EXERCISES AND AEROBIC ARE RECOMMENDED.  HE WAS ADVISED TO FOLLOW A HEALTHY DIET WITH AT LEAST SIX FRUITS/VEGGIES PER DAY, DECREASE INTAKE OF RED MEAT, AND TO INCREASE FISH INTAKE TO TWO DAYS PER WEEK.  MEATS/FISH SHOULD NOT BE FRIED, BAKED OR BROILED IS PREFERABLE.  I SUGGEST WEARING SPF 50 SUNSCREEN ON EXPOSED PARTS AND ESPECIALLY WHEN IN THE DIRECT SUNLIGHT FOR AN EXTENDED PERIOD OF TIME.  PLEASE AVOID FAST FOOD RESTAURANTS AND INCREASE YOUR WATER INTAKE.  - POC Hemoccult Bld/Stl (1-Cd Office Dx)  2. Diabetic mononeuropathy associated with type 2 diabetes mellitus ([facility])  Diabetic foot exam performed.  I DISCUSSED WITH THE PATIENT AT LENGTH REGARDING THE GOALS OF GLYCEMIC CONTROL AND POSSIBLE LONG-TERM COMPLICATIONS.  I  ALSO STRESSED THE IMPORTANCE OF COMPLIANCE WITH HOME GLUCOSE MONITORING, DIETARY RESTRICTIONS INCLUDING AVOIDANCE OF SUGARY DRINKS/PROCESSED FOODS,  ALONG WITH  REGULAR EXERCISE.  I  ALSO STRESSED THE IMPORTANCE OF ANNUAL EYE EXAMS, SELF FOOT CARE AND COMPLIANCE WITH OFFICE VISITS.  - CMP14+EGFR - CBC - Lipid panel - Hemoglobin A1c - POCT Urinalysis Dipstick (81002) - POCT UA - Microalbumin  3. Benign hypertensive heart disease without heart failure  Chronic, well controlled. He will continue with current meds. EKG performed, no new changes noted. He is encouraged to avoid adding salt to his foods. He will rto in six months for re-evaluation.   - EKG 12-Lead  4. Atherosclerosis of native coronary artery of native heart without angina pectoris  Chronic, yet stable. He is encouraged to continue with his regular exercise regimen and to take meds as directed.   5. Benign prostatic hyperplasia without lower urinary tract symptoms  DRE performed, stool heme negative.  Prostate is enlarged, no nodules.   - PSA    N. , MD  THE PATIENT IS ENCOURAGED TO PRACTICE SOCIAL DISTANCING DUE TO THE COVID-19 PANDEMIC.   

## 2019-08-22 LAB — CBC
Hematocrit: 43.5 % (ref 37.5–51.0)
Hemoglobin: 14.7 g/dL (ref 13.0–17.7)
MCH: 32.5 pg (ref 26.6–33.0)
MCHC: 33.8 g/dL (ref 31.5–35.7)
MCV: 96 fL (ref 79–97)
Platelets: 194 10*3/uL (ref 150–450)
RBC: 4.52 x10E6/uL (ref 4.14–5.80)
RDW: 13.2 % (ref 11.6–15.4)
WBC: 4.9 10*3/uL (ref 3.4–10.8)

## 2019-08-22 LAB — CMP14+EGFR
ALT: 16 IU/L (ref 0–44)
AST: 19 IU/L (ref 0–40)
Albumin/Globulin Ratio: 1.4 (ref 1.2–2.2)
Albumin: 4.5 g/dL (ref 3.8–4.8)
Alkaline Phosphatase: 55 IU/L (ref 39–117)
BUN/Creatinine Ratio: 12 (ref 10–24)
BUN: 13 mg/dL (ref 8–27)
Bilirubin Total: 1 mg/dL (ref 0.0–1.2)
CO2: 25 mmol/L (ref 20–29)
Calcium: 9.6 mg/dL (ref 8.6–10.2)
Chloride: 99 mmol/L (ref 96–106)
Creatinine, Ser: 1.11 mg/dL (ref 0.76–1.27)
GFR calc Af Amer: 82 mL/min/{1.73_m2} (ref >59)
GFR calc non Af Amer: 71 mL/min/{1.73_m2} (ref >59)
Globulin, Total: 3.2 g/dL (ref 1.5–4.5)
Glucose: 103 mg/dL — ABNORMAL HIGH (ref 65–99)
Potassium: 4.7 mmol/L (ref 3.5–5.2)
Sodium: 137 mmol/L (ref 134–144)
Total Protein: 7.7 g/dL (ref 6.0–8.5)

## 2019-08-22 LAB — LIPID PANEL
Chol/HDL Ratio: 2.2 ratio (ref 0.0–5.0)
Cholesterol, Total: 170 mg/dL (ref 100–199)
HDL: 78 mg/dL (ref >39)
LDL Chol Calc (NIH): 81 mg/dL (ref 0–99)
Triglycerides: 54 mg/dL (ref 0–149)
VLDL Cholesterol Cal: 11 mg/dL (ref 5–40)

## 2019-08-22 LAB — PSA: Prostate Specific Ag, Serum: 0.7 ng/mL (ref 0.0–4.0)

## 2019-08-22 LAB — HEMOGLOBIN A1C
Est. average glucose Bld gHb Est-mCnc: 128 mg/dL
Hgb A1c MFr Bld: 6.1 % — ABNORMAL HIGH (ref 4.8–5.6)

## 2019-10-28 ENCOUNTER — Other Ambulatory Visit: Payer: Self-pay | Admitting: Internal Medicine

## 2019-12-20 DIAGNOSIS — K641 Second degree hemorrhoids: Secondary | ICD-10-CM | POA: Diagnosis not present

## 2019-12-20 DIAGNOSIS — K5904 Chronic idiopathic constipation: Secondary | ICD-10-CM | POA: Diagnosis not present

## 2019-12-28 DIAGNOSIS — H30033 Focal chorioretinal inflammation, peripheral, bilateral: Secondary | ICD-10-CM | POA: Diagnosis not present

## 2019-12-28 DIAGNOSIS — H44521 Atrophy of globe, right eye: Secondary | ICD-10-CM | POA: Diagnosis not present

## 2019-12-28 DIAGNOSIS — H35033 Hypertensive retinopathy, bilateral: Secondary | ICD-10-CM | POA: Diagnosis not present

## 2019-12-28 DIAGNOSIS — H3581 Retinal edema: Secondary | ICD-10-CM | POA: Diagnosis not present

## 2019-12-28 DIAGNOSIS — Z79899 Other long term (current) drug therapy: Secondary | ICD-10-CM | POA: Diagnosis not present

## 2019-12-28 DIAGNOSIS — Z5181 Encounter for therapeutic drug level monitoring: Secondary | ICD-10-CM | POA: Diagnosis not present

## 2019-12-28 DIAGNOSIS — Z947 Corneal transplant status: Secondary | ICD-10-CM | POA: Diagnosis not present

## 2020-01-04 DIAGNOSIS — Z947 Corneal transplant status: Secondary | ICD-10-CM | POA: Diagnosis not present

## 2020-01-04 DIAGNOSIS — H44521 Atrophy of globe, right eye: Secondary | ICD-10-CM | POA: Diagnosis not present

## 2020-01-04 DIAGNOSIS — H35033 Hypertensive retinopathy, bilateral: Secondary | ICD-10-CM | POA: Diagnosis not present

## 2020-01-04 DIAGNOSIS — H3581 Retinal edema: Secondary | ICD-10-CM | POA: Diagnosis not present

## 2020-01-07 DIAGNOSIS — H209 Unspecified iridocyclitis: Secondary | ICD-10-CM | POA: Diagnosis not present

## 2020-01-07 DIAGNOSIS — H4042X2 Glaucoma secondary to eye inflammation, left eye, moderate stage: Secondary | ICD-10-CM | POA: Diagnosis not present

## 2020-01-08 ENCOUNTER — Ambulatory Visit: Payer: Medicare Other | Admitting: Internal Medicine

## 2020-01-08 ENCOUNTER — Ambulatory Visit: Payer: Medicare Other

## 2020-01-10 ENCOUNTER — Ambulatory Visit (INDEPENDENT_AMBULATORY_CARE_PROVIDER_SITE_OTHER): Payer: Medicare Other

## 2020-01-10 ENCOUNTER — Other Ambulatory Visit: Payer: Self-pay

## 2020-01-10 VITALS — BP 116/62 | HR 67 | Temp 97.5°F | Ht 66.2 in | Wt 163.6 lb

## 2020-01-10 DIAGNOSIS — Z0000 Encounter for general adult medical examination without abnormal findings: Secondary | ICD-10-CM | POA: Diagnosis not present

## 2020-01-10 NOTE — Progress Notes (Signed)
This visit occurred during the SARS-CoV-2 public health emergency.  Safety protocols were in place, including screening questions prior to the visit, additional usage of staff PPE, and extensive cleaning of exam room while observing appropriate contact time as indicated for disinfecting solutions.  Subjective:   Steven [name] is a 64 y.o. male who presents for Medicare Annual/Subsequent preventive examination.  Review of Systems:  n/a Cardiac Risk Factors include: advanced age (>97mn, >>82women);diabetes mellitus;hypertension;male gender     Objective:    Vitals: BP 116/62 (BP Location: Left Arm, Patient Position: Sitting, Cuff Size: Normal)   Pulse 67   Temp (!) 97.5 F (36.4 C) (Oral)   Ht 5' 6.2" (1.681 m)   Wt 163 lb 9.6 oz (74.2 kg)   SpO2 98%   BMI 26.25 kg/m   Body mass index is 26.25 kg/m.  Advanced Directives 01/10/2020 07/13/2019 01/04/2019 08/16/2016 09/21/2014 12/18/2011  Does Patient Have a Medical Advance Directive? No No Yes No No Patient does not have advance directive  Type of Advance Directive - - [place] Chart? - - No - copy requested - - -  Would patient like information on creating a medical advance directive? No - Patient declined No - Patient declined - No - patient declined information No - patient declined information -  Pre-existing out of facility DNR order (yellow form or pink MOST form) - - - - - No    Tobacco Social History   Tobacco Use  Smoking Status Former Smoker  . Packs/day: 0.75  . Years: 8.00  . Pack years: 6.00  . Types: Cigarettes  . Quit date: 10/11/2001  . Years since quitting: 18.2  Smokeless Tobacco Never Used     Counseling given: Not Answered   Clinical Intake:  Pre-visit preparation completed: Yes  Pain : No/denies pain     Nutritional Status: BMI 25 -29 Overweight Nutritional Risks: None Diabetes: Yes  How often do you need to have someone help  you when you read instructions, pamphlets, or other written materials from your doctor or pharmacy?: 1 - Never What is the last grade level you completed in school?: 12th grade     Information entered by :: NAllen LPN  Past Medical History:  Diagnosis Date  . 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 CAD (coronary artery disease), known [place] stent [place] in [place] ,last cath 2010 with patent stent and nonobstructive CAD 12/18/2011  . Chest pain at rest 12/18/2011  . Diabetes mellitus   . DM (diabetes mellitus) ([place] 12/18/[place]  . Dyslipidemia 12/18/2011  . 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 History of corneal transplant, lt. eye with blurred vision 12/18/2011  . HTN (hypertension) 12/18/2011  . Hypertension    Past Surgical History:  Procedure Laterality Date  . CARDIAC SURGERY     stent placed in 2007  . CORNEAL TRANSPLANT    . CORONARY ANGIOPLASTY  2008   STENT PLACED IN LAD.MILD DIFFUSE IN-STENT RESTENOSIS IN 2010.  .Marland KitchenCORONARY STENT PLACEMENT    . MYOCARDIAL PERFUSION STUDY  01/19/13   NORMAL STRESS NUCLEAR STUDY.  . TRANSTHORACIC ECHOCARDIOGRAM  07/28/2009   LV= MILD LVH. EF 60% TO 65%.  .[place]ASCULAR [place] 01/15/09   NORMAL LOWER ARTERIAL DOPPLER   Family History  Problem Relation Age of Onset  . Heart attack Mother   . Diabetes Mother   . Hypertension Father    Social  History   Socioeconomic History  . Marital status: Divorced    Spouse name: Not on file  . Number of children: Not on file  . Years of education: Not on file  . Highest education level: Not on file  Occupational History  . Occupation: disability  Tobacco Use  . Smoking status: Former Smoker    Packs/day: 0.75    Years: 8.00    Pack years: 6.00    Types: Cigarettes    Quit date: 10/11/2001    Years since quitting: 18.2  . Smokeless tobacco: Never Used  Substance and Sexual Activity  . Alcohol use: No    Alcohol/week: 0.0 standard drinks  . Drug use: Not Currently  . Sexual activity: Yes  Other Topics Concern    . Not on file  Social History Narrative  . Not on file   Social Determinants of Health   Financial Resource Strain: Low Risk   . Difficulty of Paying Living Expenses: Not hard at all  Food Insecurity: No Food Insecurity  . Worried About Charity fundraiser in the Last Year: Never true  . Ran Out of Food in the Last Year: Never true  Transportation Needs: No Transportation Needs  . Lack of Transportation (Medical): No  . Lack of Transportation (Non-Medical): No  Physical Activity: Sufficiently Active  . Days of Exercise per Week: 7 days  . Minutes of Exercise per Session: 30 min  Stress: No Stress Concern Present  . Feeling of Stress : Not at all  Social Connections:   . Frequency of Communication with Friends and Family:   . Frequency of Social Gatherings with Friends and Family:   . Attends Religious Services:   . Active Member of Clubs or Organizations:   . Attends Archivist Meetings:   Marland Kitchen Marital Status:     Outpatient Encounter Medications as of 01/10/2020  Medication Sig  . ACCU-CHEK FASTCLIX LANCETS MISC by Does not apply route.  Marland Kitchen aspirin EC 81 MG tablet Take 81 mg by mouth daily.  Marland Kitchen atorvastatin (LIPITOR) 10 MG tablet TAKE 1 TABLET BY MOUTH ONE TIME DAILY  . Blood Glucose Monitoring Suppl (ACCU-CHEK GUIDE ME) w/Device KIT Inject 1 kit into the skin 2 (two) times a day. Dx: e11.22  . cycloSPORINE (SANDIMMUNE) 25 MG capsule Take 75 mg by mouth 2 (two) times daily.  . dorzolamide-timolol (COSOPT) 22.3-6.8 MG/ML ophthalmic solution Place 1 drop into the left eye 2 times daily.  . folic acid (FOLVITE) 1 MG tablet Take 1 mg by mouth daily.  Marland Kitchen glucose blood (ACCU-CHEK GUIDE) test strip 1 each by Other route as needed for other. Use as instructed  . latanoprost (XALATAN) 0.005 % ophthalmic solution PLACE 1 GTT INTO THE LEFT EYE NIGHTLY  . LINZESS 290 MCG CAPS capsule Take 290 mcg by mouth daily.  Marland Kitchen lisinopril (ZESTRIL) 10 MG tablet TAKE 1 TABLET BY MOUTH ONE TIME  DAILY  . methotrexate (RHEUMATREX) 2.5 MG tablet Take 10 mg by mouth once a week. Fridays. Caution:Chemotherapy. Protect from light.   . prednisoLONE acetate (PRED FORTE) 1 % ophthalmic suspension Place 1 drop into the left eye daily.  . pregabalin (LYRICA) 25 MG capsule TAKE 1 CAPSULE BY MOUTH TWICE DAILY   No facility-administered encounter medications on file as of 01/10/2020.    Activities of Daily Living In your present state of health, do you have any difficulty performing the following activities: 01/10/2020  Hearing? N  Vision? Y  Comment glaucoma  Difficulty  concentrating or making decisions? N  Walking or climbing stairs? N  Dressing or bathing? N  Doing errands, shopping? N  Preparing Food and eating ? N  Using the Toilet? N  In the past six months, have you accidently leaked urine? N  Do you have problems with loss of bowel control? N  Managing your Medications? N  Managing your Finances? N  Housekeeping or managing your Housekeeping? N  Some recent data might be hidden    Patient Care Team: Glendale Chard, MD as PCP - General (Internal Medicine) Marilynne Halsted, MD as Referring Physician (Ophthalmology)   Assessment:   This is a routine wellness examination for Shawnn.  Exercise Activities and Dietary recommendations Current Exercise Habits: Home exercise routine, Type of exercise: walking, Time (Minutes): 45, Frequency (Times/Week): 7, Weekly Exercise (Minutes/Week): 315  Goals    . Patient Stated     No goals    . Patient Stated     01/10/2020, wants to stay healthy       Fall Risk Fall Risk  01/10/2020 03/19/2019 01/04/2019 08/08/2018  Falls in the past year? 0 0 0 No  Risk for fall due to : Medication side effect - Medication side effect -  Follow up Falls evaluation completed;Education provided;Falls prevention discussed - Falls prevention discussed -   Is the patient's home free of loose throw rugs in walkways, pet beds, electrical cords, etc?   yes       Grab bars in the bathroom? no      Handrails on the stairs?   yes      Adequate lighting?   yes  Timed Get Up and Go Performed: n/a  Depression Screen PHQ 2/9 Scores 01/10/2020 03/19/2019 01/04/2019 08/08/2018  PHQ - 2 Score 0 0 0 0  PHQ- 9 Score 0 - 0 -    Cognitive Function     6CIT Screen 01/10/2020 01/04/2019  What Year? 0 points 0 points  What month? 0 points 0 points  What time? 0 points 0 points  Count back from 20 0 points 0 points  Months in reverse 0 points 0 points  Repeat phrase 2 points 0 points  Total Score 2 0     There is no immunization history on file for this patient.  Qualifies for Shingles Vaccine? yes  Screening Tests Health Maintenance  Topic Date Due  . PNEUMOCOCCAL POLYSACCHARIDE VACCINE AGE 70-64 HIGH RISK  Never done  . HIV Screening  08/20/2020 (Originally 05/27/1971)  . HEMOGLOBIN A1C  02/18/2020  . INFLUENZA VACCINE  05/11/2020  . OPHTHALMOLOGY EXAM  07/02/2020  . FOOT EXAM  08/20/2020  . COLONOSCOPY  07/03/2023  . TETANUS/TDAP  07/07/2028  . Hepatitis C Screening  Completed   Cancer Screenings: Lung: Low Dose CT Chest recommended if Age 76-80 years, 30 pack-year currently smoking OR have quit w/in 15years. Patient does not qualify. Colorectal: up to date  Additional Screenings:  Hepatitis C Screening:12/09/2019      Plan:    Patient wants to stay healthy.  I have personally reviewed and noted the following in the patient's chart:   . Medical and social history . Use of alcohol, tobacco or illicit drugs  . Current medications and supplements . Functional ability and status . Nutritional status . Physical activity . Advanced directives . List of other physicians . Hospitalizations, surgeries, and ER visits in previous 12 months . Vitals . Screenings to include cognitive, depression, and falls . Referrals and appointments  In addition, I  have reviewed and discussed with patient certain preventive protocols, quality metrics, and  best practice recommendations. A written personalized care plan for preventive services as well as general preventive health recommendations were provided to patient.     Kellie Simmering, LPN  10/12/2581

## 2020-01-10 NOTE — Patient Instructions (Signed)
Steven [name] , Thank you for taking time to come for your Medicare Wellness Visit. I appreciate your ongoing commitment to your health goals. Please review the following plan we discussed and let me know if I can assist you in the future.   Screening recommendations/referrals: Colonoscopy: 06/2013 Recommended yearly ophthalmology/optometry visit for glaucoma screening and checkup Recommended yearly dental visit for hygiene and checkup  Vaccinations: Influenza vaccine: declines Pneumococcal vaccine: declines Tdap vaccine: 06/2018 Shingles vaccine: discussed    Advanced directives: Advance directive discussed with you today. Even though you declined this today please call our office should you change your mind and we can give you the proper paperwork for you to fill out.   Conditions/risks identified: overweight  Next appointment: 02/18/2020 at 9:45  Preventive Care 40-64 Years, Male Preventive care refers to lifestyle choices and visits with your health care provider that can promote health and wellness. What does preventive care include?  A yearly physical exam. This is also called an annual well check.  Dental exams once or twice a year.  Routine eye exams. Ask your health care provider how often you should have your eyes checked.  Personal lifestyle choices, including:  Daily care of your teeth and gums.  Regular physical activity.  Eating a healthy diet.  Avoiding tobacco and drug use.  Limiting alcohol use.  Practicing safe sex.  Taking low-dose aspirin every day starting at age 45. What happens during an annual well check? The services and screenings done by your health care provider during your annual well check will depend on your age, overall health, lifestyle risk factors, and family history of disease. Counseling  Your health care provider may ask you questions about your:  Alcohol use.  Tobacco use.  Drug use.  Emotional well-being.  Home and  relationship well-being.  Sexual activity.  Eating habits.  Work and work Astronomer. Screening  You may have the following tests or measurements:  Height, weight, and BMI.  Blood pressure.  Lipid and cholesterol levels. These may be checked every 5 years, or more frequently if you are over 7 years old.  Skin check.  Lung cancer screening. You may have this screening every year starting at age 61 if you have a 30-pack-year history of smoking and currently smoke or have quit within the past 15 years.  Fecal occult blood test (FOBT) of the stool. You may have this test every year starting at age 64.  Flexible sigmoidoscopy or colonoscopy. You may have a sigmoidoscopy every 5 years or a colonoscopy every 10 years starting at age 40.  Prostate cancer screening. Recommendations will vary depending on your family history and other risks.  Hepatitis C blood test.  Hepatitis B blood test.  Sexually transmitted disease (STD) testing.  Diabetes screening. This is done by checking your blood sugar (glucose) after you have not eaten for a while (fasting). You may have this done every 1-3 years. Discuss your test results, treatment options, and if necessary, the need for more tests with your health care provider. Vaccines  Your health care provider may recommend certain vaccines, such as:  Influenza vaccine. This is recommended every year.  Tetanus, diphtheria, and acellular pertussis (Tdap, Td) vaccine. You may need a Td booster every 10 years.  Zoster vaccine. You may need this after age 47.  Pneumococcal 13-valent conjugate (PCV13) vaccine. You may need this if you have certain conditions and have not been vaccinated.  Pneumococcal polysaccharide (PPSV23) vaccine. You may need one or two  doses if you smoke cigarettes or if you have certain conditions. Talk to your health care provider about which screenings and vaccines you need and how often you need them. This information is  not intended to replace advice given to you by your health care provider. Make sure you discuss any questions you have with your health care provider. Document Released: 10/24/2015 Document Revised: 06/16/2016 Document Reviewed: 07/29/2015 Elsevier Interactive Patient Education  2017 [employer] Prevention in the Home Falls can cause injuries. They can happen to people of all ages. There are many things you can do to make your home safe and to help prevent falls. What can I do on the outside of my home?  Regularly fix the edges of walkways and driveways and fix any cracks.  Remove anything that might make you trip as you walk through a door, such as a raised step or threshold.  Trim any bushes or trees on the path to your home.  Use bright outdoor lighting.  Clear any walking paths of anything that might make someone trip, such as rocks or tools.  Regularly check to see if handrails are loose or broken. Make sure that both sides of any steps have handrails.  Any raised decks and porches should have guardrails on the edges.  Have any leaves, snow, or ice cleared regularly.  Use sand or salt on walking paths during winter.  Clean up any spills in your garage right away. This includes oil or grease spills. What can I do in the bathroom?  Use night lights.  Install grab bars by the toilet and in the tub and shower. Do not use towel bars as grab bars.  Use non-skid mats or decals in the tub or shower.  If you need to sit down in the shower, use a plastic, non-slip stool.  Keep the floor dry. Clean up any water that spills on the floor as soon as it happens.  Remove soap buildup in the tub or shower regularly.  Attach bath mats securely with double-sided non-slip rug tape.  Do not have throw rugs and other things on the floor that can make you trip. What can I do in the bedroom?  Use night lights.  Make sure that you have a light by your bed that is easy to  reach.  Do not use any sheets or blankets that are too big for your bed. They should not hang down onto the floor.  Have a firm chair that has side arms. You can use this for support while you get dressed.  Do not have throw rugs and other things on the floor that can make you trip. What can I do in the kitchen?  Clean up any spills right away.  Avoid walking on wet floors.  Keep items that you use a lot in easy-to-reach places.  If you need to reach something above you, use a strong step stool that has a grab bar.  Keep electrical cords out of the way.  Do not use floor polish or wax that makes floors slippery. If you must use wax, use non-skid floor wax.  Do not have throw rugs and other things on the floor that can make you trip. What can I do with my stairs?  Do not leave any items on the stairs.  Make sure that there are handrails on both sides of the stairs and use them. Fix handrails that are broken or loose. Make sure that handrails are as  long as the stairways.  Check any carpeting to make sure that it is firmly attached to the stairs. Fix any carpet that is loose or worn.  Avoid having throw rugs at the top or bottom of the stairs. If you do have throw rugs, attach them to the floor with carpet tape.  Make sure that you have a light switch at the top of the stairs and the bottom of the stairs. If you do not have them, ask someone to add them for you. What else can I do to help prevent falls?  Wear shoes that:  Do not have high heels.  Have rubber bottoms.  Are comfortable and fit you well.  Are closed at the toe. Do not wear sandals.  If you use a stepladder:  Make sure that it is fully opened. Do not climb a closed stepladder.  Make sure that both sides of the stepladder are locked into place.  Ask someone to hold it for you, if possible.  Clearly mark and make sure that you can see:  Any grab bars or handrails.  First and last steps.  Where the  edge of each step is.  Use tools that help you move around (mobility aids) if they are needed. These include:  Canes.  Walkers.  Scooters.  Crutches.  Turn on the lights when you go into a dark area. Replace any light bulbs as soon as they burn out.  Set up your furniture so you have a clear path. Avoid moving your furniture around.  If any of your floors are uneven, fix them.  If there are any pets around you, be aware of where they are.  Review your medicines with your doctor. Some medicines can make you feel dizzy. This can increase your chance of falling. Ask your doctor what other things that you can do to help prevent falls. This information is not intended to replace advice given to you by your health care provider. Make sure you discuss any questions you have with your health care provider. Document Released: 07/24/2009 Document Revised: 03/04/2016 Document Reviewed: 11/01/2014 Elsevier Interactive Patient Education  2017 [employer].

## 2020-02-03 ENCOUNTER — Other Ambulatory Visit: Payer: Self-pay | Admitting: Internal Medicine

## 2020-02-18 ENCOUNTER — Ambulatory Visit (INDEPENDENT_AMBULATORY_CARE_PROVIDER_SITE_OTHER): Payer: Medicare Other | Admitting: Internal Medicine

## 2020-02-18 ENCOUNTER — Encounter: Payer: Self-pay | Admitting: Internal Medicine

## 2020-02-18 ENCOUNTER — Other Ambulatory Visit: Payer: Self-pay

## 2020-02-18 VITALS — BP 118/68 | HR 65 | Temp 98.1°F | Ht 68.0 in | Wt 165.6 lb

## 2020-02-18 DIAGNOSIS — I119 Hypertensive heart disease without heart failure: Secondary | ICD-10-CM | POA: Diagnosis not present

## 2020-02-18 DIAGNOSIS — Z6825 Body mass index (BMI) 25.0-25.9, adult: Secondary | ICD-10-CM | POA: Diagnosis not present

## 2020-02-18 DIAGNOSIS — Z23 Encounter for immunization: Secondary | ICD-10-CM

## 2020-02-18 DIAGNOSIS — E1141 Type 2 diabetes mellitus with diabetic mononeuropathy: Secondary | ICD-10-CM

## 2020-02-18 DIAGNOSIS — E7800 Pure hypercholesterolemia, unspecified: Secondary | ICD-10-CM

## 2020-02-18 NOTE — Progress Notes (Signed)
This visit occurred during the SARS-CoV-2 public health emergency.  Safety protocols were in place, including screening questions prior to the visit, additional usage of staff PPE, and extensive cleaning of exam room while observing appropriate contact time as indicated for disinfecting solutions.  Subjective:     Patient ID: Steven [name] , male    DOB: [birth year] , 64 y.o.   MRN: [id number]   Chief Complaint  Patient presents with  . Diabetes  . Hypertension    HPI  He is here today for a DM/BP check. He reports compliance with BP meds. He has been controlling DM with diet/lifestyle changes. He has no new complaints.   Diabetes He presents for his follow-up diabetic visit. He has type 2 diabetes mellitus. Hypoglycemia symptoms include headaches. Pertinent negatives for diabetes include no blurred vision and no chest pain. There are no hypoglycemic complications. Risk factors for coronary artery disease include diabetes mellitus, dyslipidemia, male sex and hypertension. Current diabetic treatment includes diet. His weight is stable. He is following a diabetic diet. He participates in exercise daily. He does not see a podiatrist.Eye exam is current.  Hypertension This is a chronic problem. The current episode started more than 1 year ago. Associated symptoms include headaches. Pertinent negatives include no blurred vision or chest pain. Risk factors for coronary artery disease include male gender, diabetes mellitus and dyslipidemia. Hypertensive end-organ damage includes CAD/MI.     Past Medical History:  Diagnosis Date  . 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 CAD (coronary artery disease), known LAD stent placed in 2007 in [place]. ,last cath 2010 with patent stent and nonobstructive CAD 12/18/2011  . Chest pain at rest 12/18/2011  . Diabetes mellitus   . DM (diabetes mellitus) ([place]) 12/18/2011  . Dyslipidemia 12/18/2011  . 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 HTN (hypertension) 12/18/2011  . Hypertension      Family History  Problem Relation Age of Onset  . Heart attack Mother   . Diabetes Mother   . Hypertension Father      Current Outpatient Medications:  .  ACCU-CHEK FASTCLIX LANCETS MISC, by Does not apply route., Disp: , Rfl:  .  aspirin EC 81 MG tablet, Take 81 mg by mouth daily., Disp: , Rfl:  .  atorvastatin (LIPITOR) 10 MG tablet, TAKE 1 TABLET BY MOUTH ONE TIME DAILY, Disp: 90 tablet, Rfl: 1 .  Blood Glucose Monitoring Suppl (ACCU-CHEK GUIDE ME) w/Device KIT, Inject 1 kit into the skin 2 (two) times a day. Dx: e11.22, Disp: 1 kit, Rfl: 1 .  cycloSPORINE (SANDIMMUNE) 25 MG capsule, Take 75 mg by mouth 2 (two) times daily., Disp: , Rfl:  .  dorzolamide-timolol (COSOPT) 22.3-6.8 MG/ML ophthalmic solution, Place 1 drop into the left eye 2 times daily., Disp: , Rfl:  .  folic acid (FOLVITE) 1 MG tablet, Take 1 mg by mouth daily., Disp: , Rfl:  .  glucose blood (ACCU-CHEK GUIDE) test strip, 1 each by Other route as needed for other. Use as instructed, Disp: , Rfl:  .  latanoprost (XALATAN) 0.005 % ophthalmic solution, PLACE 1 GTT INTO THE LEFT EYE NIGHTLY, Disp: , Rfl:  .  LINZESS 290 MCG CAPS capsule, Take 290 mcg by mouth daily., Disp: , Rfl:  .  lisinopril (ZESTRIL) 10 MG tablet, TAKE 1 TABLET BY MOUTH EVERY DAY, Disp: 90 tablet, Rfl: 1 .  methotrexate (RHEUMATREX) 2.5 MG tablet, Take 10 mg  by mouth once a week. Fridays. Caution:Chemotherapy. Protect from light. , Disp: , Rfl:  .  prednisoLONE acetate (PRED FORTE) 1 % ophthalmic suspension, Place 1 drop into the left eye daily., Disp: , Rfl:  .  pregabalin (LYRICA) 25 MG capsule, TAKE 1 CAPSULE BY MOUTH TWICE DAILY, Disp: 60 capsule, Rfl: 5   No Known Allergies   Review of Systems  Constitutional: Negative.   Eyes: Negative for blurred vision.  Respiratory: Negative.   Cardiovascular: Negative.  Negative for chest pain.  Gastrointestinal: Negative.    Neurological: Positive for headaches.  Psychiatric/Behavioral: Negative.      Today's Vitals   02/18/20 0945  BP: 118/68  Pulse: 65  Temp: 98.1 F (36.7 C)  TempSrc: Oral  Weight: 165 lb 9.6 oz (75.1 kg)  Height: '5\' 8"'$  (1.727 m)   Body mass index is 25.18 kg/m.   Objective:  Physical Exam Vitals and nursing note reviewed.  Constitutional:      Appearance: Normal appearance.  Cardiovascular:     Rate and Rhythm: Normal rate and regular rhythm.     Heart sounds: Normal heart sounds.  Pulmonary:     Effort: Pulmonary effort is normal.     Breath sounds: Normal breath sounds.  Skin:    General: Skin is warm.  Neurological:     General: No focal deficit present.     Mental Status: He is alert.  Psychiatric:        Mood and Affect: Mood normal.         Assessment And Plan:     1. Diabetic mononeuropathy associated with type 2 diabetes mellitus (HCC)  Chronic. I will check labs as below. He was congratulated on maintaining his healthy lifestyle.   - Hemoglobin A1c - BMP8+EGFR  2. Benign hypertensive heart disease without heart failure  Chronic, well controlled. He will continue with current meds for now.   3. Pure hypercholesterolemia  Nov 2020 LDL 81, he will continue with current meds. He is aware LDL goal is less than 70.  He will continue with current meds for now. If needed, I will adjust the dose. I will make further recommendations once his labs are available for review.   4. Body mass index (BMI) of 25.0-25.9 in adult  His weight is stable. He is encouraged to exercise at least 30 minutes five days weekly.   5. Immunization due  He declines COVID vaccine at this time. Risks/benefits discussed with the patient.    [name], MD    THE PATIENT IS ENCOURAGED TO PRACTICE SOCIAL DISTANCING DUE TO THE COVID-19 PANDEMIC.

## 2020-02-18 NOTE — Patient Instructions (Signed)
Exercising to Stay Healthy To become healthy and stay healthy, it is recommended that you do moderate-intensity and vigorous-intensity exercise. You can tell that you are exercising at a moderate intensity if your heart starts beating faster and you start breathing faster but can still hold a conversation. You can tell that you are exercising at a vigorous intensity if you are breathing much harder and faster and cannot hold a conversation while exercising. Exercising regularly is important. It has many health benefits, such as:  Improving overall fitness, flexibility, and endurance.  Increasing bone density.  Helping with weight control.  Decreasing body fat.  Increasing muscle strength.  Reducing stress and tension.  Improving overall health. How often should I exercise? Choose an activity that you enjoy, and set realistic goals. Your health care provider can help you make an activity plan that works for you. Exercise regularly as told by your health care provider. This may include:  Doing strength training two times a week, such as: ? Lifting weights. ? Using resistance bands. ? Push-ups. ? Sit-ups. ? Yoga.  Doing a certain intensity of exercise for a given amount of time. Choose from these options: ? A total of 150 minutes of moderate-intensity exercise every week. ? A total of 75 minutes of vigorous-intensity exercise every week. ? A mix of moderate-intensity and vigorous-intensity exercise every week. Children, pregnant women, people who have not exercised regularly, people who are overweight, and older adults may need to talk with a health care provider about what activities are safe to do. If you have a medical condition, be sure to talk with your health care provider before you start a new exercise program. What are some exercise ideas? Moderate-intensity exercise ideas include:  Walking 1 mile (1.6 km) in about 15  minutes.  Biking.  Hiking.  Golfing.  Dancing.  Water aerobics. Vigorous-intensity exercise ideas include:  Walking 4.5 miles (7.2 km) or more in about 1 hour.  Jogging or running 5 miles (8 km) in about 1 hour.  Biking 10 miles (16.1 km) or more in about 1 hour.  Lap swimming.  Roller-skating or in-line skating.  Cross-country skiing.  Vigorous competitive sports, such as football, basketball, and soccer.  Jumping rope.  Aerobic dancing. What are some everyday activities that can help me to get exercise?  Yard work, such as: ? Pushing a lawn mower. ? Raking and bagging leaves.  Washing your car.  Pushing a stroller.  Shoveling snow.  Gardening.  Washing windows or floors. How can I be more active in my day-to-day activities?  Use stairs instead of an elevator.  Take a walk during your lunch break.  If you drive, park your car farther away from your work or school.  If you take public transportation, get off one stop early and walk the rest of the way.  Stand up or walk around during all of your indoor phone calls.  Get up, stretch, and walk around every 30 minutes throughout the day.  Enjoy exercise with a friend. Support to continue exercising will help you keep a regular routine of activity. What guidelines can I follow while exercising?  Before you start a new exercise program, talk with your health care provider.  Do not exercise so much that you hurt yourself, feel dizzy, or get very short of breath.  Wear comfortable clothes and wear shoes with good support.  Drink plenty of water while you exercise to prevent dehydration or heat stroke.  Work out until your breathing   and your heartbeat get faster. Where to find more information  [place].S[place] Department of Health and Human Services: www.hhs.gov  Centers for Disease Control and Prevention (CDC): www.cdc.gov Summary  Exercising regularly is important. It will improve your overall fitness,  flexibility, and endurance.  Regular exercise also will improve your overall health. It can help you control your weight, reduce stress, and improve your bone density.  Do not exercise so much that you hurt yourself, feel dizzy, or get very short of breath.  Before you start a new exercise program, talk with your health care provider. This information is not intended to replace advice given to you by your health care provider. Make sure you discuss any questions you have with your health care provider. Document Revised: 09/09/2017 Document Reviewed: 08/18/2017 Elsevier Patient Education  2020 [employer]  

## 2020-02-19 LAB — BMP8+EGFR
BUN/Creatinine Ratio: 12 (ref 10–24)
BUN: 13 mg/dL (ref 8–27)
CO2: 21 mmol/L (ref 20–29)
Calcium: 9.7 mg/dL (ref 8.6–10.2)
Chloride: 102 mmol/L (ref 96–106)
Creatinine, Ser: 1.1 mg/dL (ref 0.76–1.27)
GFR calc Af Amer: 83 mL/min/{1.73_m2} (ref >59)
GFR calc non Af Amer: 72 mL/min/{1.73_m2} (ref >59)
Glucose: 121 mg/dL — ABNORMAL HIGH (ref 65–99)
Potassium: 4.6 mmol/L (ref 3.5–5.2)
Sodium: 138 mmol/L (ref 134–144)

## 2020-02-19 LAB — HEMOGLOBIN A1C
Est. average glucose Bld gHb Est-mCnc: 126 mg/dL
Hgb A1c MFr Bld: 6 % — ABNORMAL HIGH (ref 4.8–5.6)

## 2020-03-12 ENCOUNTER — Other Ambulatory Visit: Payer: Self-pay | Admitting: Internal Medicine

## 2020-03-12 NOTE — Telephone Encounter (Signed)
Last 02/18/20 Next 09/02/2020

## 2020-04-08 DIAGNOSIS — H3581 Retinal edema: Secondary | ICD-10-CM | POA: Diagnosis not present

## 2020-04-08 DIAGNOSIS — H30033 Focal chorioretinal inflammation, peripheral, bilateral: Secondary | ICD-10-CM | POA: Diagnosis not present

## 2020-04-08 DIAGNOSIS — H4042X2 Glaucoma secondary to eye inflammation, left eye, moderate stage: Secondary | ICD-10-CM | POA: Diagnosis not present

## 2020-04-08 DIAGNOSIS — H35033 Hypertensive retinopathy, bilateral: Secondary | ICD-10-CM | POA: Diagnosis not present

## 2020-04-08 DIAGNOSIS — H44521 Atrophy of globe, right eye: Secondary | ICD-10-CM | POA: Diagnosis not present

## 2020-04-09 DIAGNOSIS — Z79899 Other long term (current) drug therapy: Secondary | ICD-10-CM | POA: Diagnosis not present

## 2020-04-09 DIAGNOSIS — H30033 Focal chorioretinal inflammation, peripheral, bilateral: Secondary | ICD-10-CM | POA: Diagnosis not present

## 2020-05-02 ENCOUNTER — Other Ambulatory Visit: Payer: Self-pay | Admitting: Internal Medicine

## 2020-06-30 ENCOUNTER — Other Ambulatory Visit: Payer: Self-pay | Admitting: Internal Medicine

## 2020-06-30 NOTE — Telephone Encounter (Signed)
Lyrica refil Last may 2021 Next nov 2021

## 2020-07-01 DIAGNOSIS — H30033 Focal chorioretinal inflammation, peripheral, bilateral: Secondary | ICD-10-CM | POA: Diagnosis not present

## 2020-07-01 DIAGNOSIS — H35033 Hypertensive retinopathy, bilateral: Secondary | ICD-10-CM | POA: Diagnosis not present

## 2020-07-01 DIAGNOSIS — H3581 Retinal edema: Secondary | ICD-10-CM | POA: Diagnosis not present

## 2020-07-01 DIAGNOSIS — H44521 Atrophy of globe, right eye: Secondary | ICD-10-CM | POA: Diagnosis not present

## 2020-07-01 DIAGNOSIS — H4042X2 Glaucoma secondary to eye inflammation, left eye, moderate stage: Secondary | ICD-10-CM | POA: Diagnosis not present

## 2020-07-31 ENCOUNTER — Other Ambulatory Visit: Payer: Self-pay | Admitting: Internal Medicine

## 2020-09-02 ENCOUNTER — Other Ambulatory Visit: Payer: Self-pay

## 2020-09-02 ENCOUNTER — Encounter: Payer: Self-pay | Admitting: Internal Medicine

## 2020-09-02 ENCOUNTER — Ambulatory Visit (INDEPENDENT_AMBULATORY_CARE_PROVIDER_SITE_OTHER): Payer: Medicare Other | Admitting: Internal Medicine

## 2020-09-02 VITALS — BP 136/78 | HR 72 | Temp 98.1°F | Ht 66.0 in | Wt 161.8 lb

## 2020-09-02 DIAGNOSIS — E7800 Pure hypercholesterolemia, unspecified: Secondary | ICD-10-CM

## 2020-09-02 DIAGNOSIS — H30033 Focal chorioretinal inflammation, peripheral, bilateral: Secondary | ICD-10-CM | POA: Diagnosis not present

## 2020-09-02 DIAGNOSIS — Z2821 Immunization not carried out because of patient refusal: Secondary | ICD-10-CM

## 2020-09-02 DIAGNOSIS — I119 Hypertensive heart disease without heart failure: Secondary | ICD-10-CM | POA: Diagnosis not present

## 2020-09-02 DIAGNOSIS — R351 Nocturia: Secondary | ICD-10-CM

## 2020-09-02 DIAGNOSIS — Z79899 Other long term (current) drug therapy: Secondary | ICD-10-CM | POA: Diagnosis not present

## 2020-09-02 DIAGNOSIS — N5201 Erectile dysfunction due to arterial insufficiency: Secondary | ICD-10-CM | POA: Diagnosis not present

## 2020-09-02 DIAGNOSIS — Z0000 Encounter for general adult medical examination without abnormal findings: Secondary | ICD-10-CM

## 2020-09-02 DIAGNOSIS — E1141 Type 2 diabetes mellitus with diabetic mononeuropathy: Secondary | ICD-10-CM

## 2020-09-02 LAB — POCT URINALYSIS DIPSTICK
Bilirubin, UA: NEGATIVE
Glucose, UA: NEGATIVE
Ketones, UA: NEGATIVE
Nitrite, UA: NEGATIVE
Protein, UA: NEGATIVE
Spec Grav, UA: 1.025 (ref 1.010–1.025)
Urobilinogen, UA: 0.2 E.U./dL
pH, UA: 5.5 (ref 5.0–8.0)

## 2020-09-02 LAB — POCT UA - MICROALBUMIN
Albumin/Creatinine Ratio, Urine, POC: <30
Creatinine, POC: 300 mg/dL
Microalbumin Ur, POC: 10 mg/L

## 2020-09-02 LAB — POC HEMOCCULT BLD/STL (OFFICE/1-CARD/DIAGNOSTIC): Fecal Occult Blood, POC: NEGATIVE

## 2020-09-02 MED ORDER — SILDENAFIL CITRATE 20 MG PO TABS: ORAL_TABLET | ORAL | 0 refills | Status: DC

## 2020-09-02 NOTE — Progress Notes (Signed)
I,Katawbba Wiggins,acting as a Education administrator for [name], MD.,have documented all relevant documentation on the behalf of [name], MD,as directed by  [name], MD while in the presence of [name], MD.  This visit occurred during the SARS-CoV-2 public health emergency.  Safety protocols were in place, including screening questions prior to the visit, additional usage of staff PPE, and extensive cleaning of exam room while observing appropriate contact time as indicated for disinfecting solutions.  Subjective:     Patient ID: Steven [name] , male    DOB: 10/11/1956 , 64 y.o.   MRN: [id number]   Chief Complaint  Patient presents with  . Annual Exam  . Diabetes    HPI  He is here today for a full physical exam. He denies having any concerns or complaints. Plans to travel to [facility] for Thanksgiving to spend time with family.   Diabetes He presents for his follow-up diabetic visit. He has type 2 diabetes mellitus. There are no hypoglycemic associated symptoms. Pertinent negatives for diabetes include no blurred vision and no chest pain. There are no hypoglycemic complications. Risk factors for coronary artery disease include diabetes mellitus, dyslipidemia, male sex and hypertension. Current diabetic treatment includes diet. He is following a diabetic diet. He participates in exercise daily.  Hypertension This is a chronic problem. The current episode started more than 1 year ago. Pertinent negatives include no blurred vision or chest pain. The current treatment provides moderate improvement. There are no compliance problems.  Hypertensive end-organ damage includes CAD/MI.     Past Medical History:  Diagnosis Date  . 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 CAD (coronary artery disease), known LAD stent placed in 2007 in [place]. ,last cath 2010 with patent stent and nonobstructive CAD 12/18/2011  . Chest pain at rest 12/18/2011  . Diabetes mellitus   . DM (diabetes mellitus) ([place])  12/18/2011  . Dyslipidemia 12/18/2011  . 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 History of corneal transplant, lt. eye with blurred vision 12/18/2011  . HTN (hypertension) 12/18/2011  . Hypertension      Family History  Problem Relation Age of Onset  . Heart attack Mother   . Diabetes Mother   . Hypertension Father      Current Outpatient Medications:  .  ACCU-CHEK FASTCLIX LANCETS MISC, by Does not apply route., Disp: , Rfl:  .  aspirin EC 81 MG tablet, Take 81 mg by mouth daily., Disp: , Rfl:  .  atorvastatin (LIPITOR) 10 MG tablet, TAKE 1 TABLET BY MOUTH ONE TIME DAILY, Disp: 90 tablet, Rfl: 1 .  Blood Glucose Monitoring Suppl (ACCU-CHEK GUIDE ME) w/Device KIT, Inject 1 kit into the skin 2 (two) times a day. Dx: e11.22, Disp: 1 kit, Rfl: 1 .  cycloSPORINE (SANDIMMUNE) 25 MG capsule, Take 75 mg by mouth 2 (two) times daily., Disp: , Rfl:  .  dorzolamide-timolol (COSOPT) 22.3-6.8 MG/ML ophthalmic solution, Place 1 drop into the left eye 2 times daily., Disp: , Rfl:  .  folic acid (FOLVITE) 1 MG tablet, Take 1 mg by mouth daily., Disp: , Rfl:  .  glucose blood (ACCU-CHEK GUIDE) test strip, 1 each by Other route as needed for other. Use as instructed, Disp: , Rfl:  .  latanoprost (XALATAN) 0.005 % ophthalmic solution, PLACE 1 GTT INTO THE LEFT EYE NIGHTLY, Disp: , Rfl:  .  LINZESS 290 MCG CAPS capsule, Take 290 mcg by mouth daily., Disp: , Rfl:  .  lisinopril (ZESTRIL)  10 MG tablet, TAKE 1 TABLET BY MOUTH EVERY DAY, Disp: 90 tablet, Rfl: 1 .  methotrexate (RHEUMATREX) 2.5 MG tablet, Take 10 mg by mouth once a week. Fridays. Caution:Chemotherapy. Protect from light. , Disp: , Rfl:  .  prednisoLONE acetate (PRED FORTE) 1 % ophthalmic suspension, Place 1 drop into the left eye daily., Disp: , Rfl:  .  pregabalin (LYRICA) 25 MG capsule, TAKE 1 CAPSULE BY MOUTH TWICE DAILY, Disp: 180 capsule, Rfl: 0 .  sildenafil (REVATIO) 20 MG tablet, Take one hour prior to sexual activity, Disp: 10 tablet, Rfl: 0    No Known Allergies   Men's preventive visit. Patient Health Questionnaire (PHQ-2) is    Office Visit from 09/02/2020 in [facility]  PHQ-2 Total Score 0    . Patient is on a healthy diet. Marital status: Divorced. Relevant history for alcohol use is:  Social History   Substance and Sexual Activity  Alcohol Use No  . Alcohol/week: 0.0 standard drinks  . Relevant history for tobacco use is:  Social History   Tobacco Use  Smoking Status Former Smoker  . Packs/day: 0.75  . Years: 8.00  . Pack years: 6.00  . Types: Cigarettes  . Quit date: 10/11/2001  . Years since quitting: 18.9  Smokeless Tobacco Never Used  .   Review of Systems  Constitutional: Negative.   HENT: Negative.   Eyes: Negative.  Negative for blurred vision.  Respiratory: Negative.   Cardiovascular: Negative.  Negative for chest pain.  Gastrointestinal: Negative.   Endocrine: Negative.   Genitourinary: Negative.   Musculoskeletal: Negative.   Skin: Negative.   Allergic/Immunologic: Negative.   Neurological: Negative.   Hematological: Negative.   Psychiatric/Behavioral: Negative.      Today's Vitals   09/02/20 0903  BP: 136/78  Pulse: 72  Temp: 98.1 F (36.7 C)  TempSrc: Oral  Weight: 161 lb 12.8 oz (73.4 kg)  Height: _0  (1.676 m)  PainSc: 0-No pain   Body mass index is 26.12 kg/m.  Wt Readings from Last 3 Encounters:  09/02/20 161 lb 12.8 oz (73.4 kg)  02/18/20 165 lb 9.6 oz (75.1 kg)  01/10/20 163 lb 9.6 oz (74.2 kg)   Objective:  Physical Exam Vitals and nursing note reviewed.  Constitutional:      Appearance: Normal appearance.  HENT:     Head: Normocephalic and atraumatic.     Right Ear: Tympanic membrane, ear canal and external ear normal. There is no impacted cerumen.     Left Ear: Tympanic membrane, ear canal and external ear normal. There is no impacted cerumen.     Nose:     Comments: Deferred, masked    Mouth/Throat:     Comments: Deferred,  masked Eyes:     Extraocular Movements: Extraocular movements intact.     Conjunctiva/sclera: Conjunctivae normal.     Comments: Cloudy sclerae, scarring  Cardiovascular:     Rate and Rhythm: Normal rate and regular rhythm.     Pulses:          Dorsalis pedis pulses are 1+ on the right side and 1+ on the left side.     Heart sounds: Normal heart sounds.  Pulmonary:     Breath sounds: Normal breath sounds.  Abdominal:     General: Bowel sounds are normal.     Palpations: Abdomen is soft.  Genitourinary:    Prostate: Normal.     Rectum: Guaiac result negative.  Musculoskeletal:  General: Normal range of motion.     Cervical back: Normal range of motion and neck supple.  Feet:     Right foot:     Protective Sensation: 5 sites tested. 5 sites sensed.     Skin integrity: Dry skin present.     Toenail Condition: Right toenails are long.     Left foot:     Protective Sensation: 5 sites tested. 5 sites sensed.     Skin integrity: Dry skin present.     Toenail Condition: Left toenails are long.  Skin:    General: Skin is warm and dry.  Neurological:     General: No focal deficit present.     Mental Status: He is alert and oriented to person, place, and time.  Psychiatric:        Mood and Affect: Mood normal.        Behavior: Behavior normal.        Thought Content: Thought content normal.        Judgment: Judgment normal.         Assessment And Plan:    1. Routine general medical examination at health care facility Comments: A full exam was performed.  DRE performed, stool heme negative. PATIENT IS ADVISED TO GET 30-45 MINUTES REGULAR EXERCISE NO LESS THAN FOUR TO FIVE DAYS PER WEEK - BOTH WEIGHTBEARING EXERCISES AND AEROBIC ARE RECOMMENDED.  PATIENT IS ADVISED TO FOLLOW A HEALTHY DIET WITH AT LEAST SIX FRUITS/VEGGIES PER DAY, DECREASE INTAKE OF RED MEAT, AND TO INCREASE FISH INTAKE TO TWO DAYS PER WEEK.  MEATS/FISH SHOULD NOT BE FRIED, BAKED OR BROILED IS PREFERABLE.  I  SUGGEST WEARING SPF 50 SUNSCREEN ON EXPOSED PARTS AND ESPECIALLY WHEN IN THE DIRECT SUNLIGHT FOR AN EXTENDED PERIOD OF TIME.  PLEASE AVOID FAST FOOD RESTAURANTS AND INCREASE YOUR WATER INTAKE.  - POC Hemoccult Bld/Stl (1-Cd Office Dx)  2. Diabetic mononeuropathy associated with type 2 diabetes mellitus ([place]) Comments: Chronic. Diabetic foot exam was performed. I DISCUSSED WITH THE PATIENT AT LENGTH REGARDING THE GOALS OF GLYCEMIC CONTROL AND POSSIBLE LONG-TERM COMPLICATIONS.  I  ALSO STRESSED THE IMPORTANCE OF COMPLIANCE WITH HOME GLUCOSE MONITORING, DIETARY RESTRICTIONS INCLUDING AVOIDANCE OF SUGARY DRINKS/PROCESSED FOODS,  ALONG WITH REGULAR EXERCISE.  I  ALSO STRESSED THE IMPORTANCE OF ANNUAL EYE EXAMS, SELF FOOT CARE AND COMPLIANCE WITH OFFICE VISITS.  - CBC - Hemoglobin A1c - CMP14+EGFR - Lipid panel - POCT Urinalysis Dipstick (81002) - POCT UA - Microalbumin  3. Benign hypertensive heart disease without heart failure Comments: Chronic, fair control. He has yet to take meds today. EKG performed, NSR w/ occasional ectopic ventricular beat, prominent R(V1) and right axis -consider RVH. He is encouraged to avoid adding salt to his foods.  - EKG 12-Lead  4. Pure hypercholesterolemia Comments: Chronic, importance of statin compliance was discussed with the patient. He is aware of LDL goal, less than 70. Encouraged to avoid fried foods & encouraged to stay active.  5. Erectile dysfunction due to arterial insufficiency Comments: He would like to start meds to address this. He denies being on any meds not reviewed today. I will send rx sildenafil $RemoveBefor'20mg'[id number]$  to use prn.  - sildenafil (REVATIO) 20 MG tablet; Take one hour prior to sexual activity  Dispense: 10 tablet; Refill: 0  6. Nocturia Comments: DRE performed, stool heme negative.  - PSA  7. COVID-19 vaccine dose declined Comments: He also declines flu, pneumonia and tetanus vaccines. Risks associated with remaning unvaccinated was discussed  with  the patient.      Patient was given opportunity to ask questions. Patient verbalized understanding of the plan and was able to repeat key elements of the plan. All questions were answered to their satisfaction.   [name], MD   I, [name], MD, have reviewed all documentation for this visit. The documentation on 09/07/20 for the exam, diagnosis, procedures, and orders are all accurate and complete.  THE PATIENT IS ENCOURAGED TO PRACTICE SOCIAL DISTANCING DUE TO THE COVID-19 PANDEMIC.

## 2020-09-02 NOTE — Patient Instructions (Signed)

## 2020-09-03 LAB — LIPID PANEL
Chol/HDL Ratio: 2.3 ratio (ref 0.0–5.0)
Cholesterol, Total: 192 mg/dL (ref 100–199)
HDL: 83 mg/dL (ref >39)
LDL Chol Calc (NIH): 100 mg/dL — ABNORMAL HIGH (ref 0–99)
Triglycerides: 45 mg/dL (ref 0–149)
VLDL Cholesterol Cal: 9 mg/dL (ref 5–40)

## 2020-09-03 LAB — CBC
Hematocrit: 43 % (ref 37.5–51.0)
Hemoglobin: 14.9 g/dL (ref 13.0–17.7)
MCH: 32.8 pg (ref 26.6–33.0)
MCHC: 34.7 g/dL (ref 31.5–35.7)
MCV: 95 fL (ref 79–97)
Platelets: 208 10*3/uL (ref 150–450)
RBC: 4.54 x10E6/uL (ref 4.14–5.80)
RDW: 13.6 % (ref 11.6–15.4)
WBC: 4.5 10*3/uL (ref 3.4–10.8)

## 2020-09-03 LAB — CMP14+EGFR
ALT: 22 IU/L (ref 0–44)
AST: 19 IU/L (ref 0–40)
Albumin/Globulin Ratio: 1.7 (ref 1.2–2.2)
Albumin: 4.8 g/dL (ref 3.8–4.8)
Alkaline Phosphatase: 55 IU/L (ref 44–121)
BUN/Creatinine Ratio: 14 (ref 10–24)
BUN: 16 mg/dL (ref 8–27)
Bilirubin Total: 0.8 mg/dL (ref 0.0–1.2)
CO2: 23 mmol/L (ref 20–29)
Calcium: 10.1 mg/dL (ref 8.6–10.2)
Chloride: 99 mmol/L (ref 96–106)
Creatinine, Ser: 1.18 mg/dL (ref 0.76–1.27)
GFR calc Af Amer: 75 mL/min/{1.73_m2} (ref >59)
GFR calc non Af Amer: 65 mL/min/{1.73_m2} (ref >59)
Globulin, Total: 2.8 g/dL (ref 1.5–4.5)
Glucose: 113 mg/dL — ABNORMAL HIGH (ref 65–99)
Potassium: 5 mmol/L (ref 3.5–5.2)
Sodium: 135 mmol/L (ref 134–144)
Total Protein: 7.6 g/dL (ref 6.0–8.5)

## 2020-09-03 LAB — HEMOGLOBIN A1C
Est. average glucose Bld gHb Est-mCnc: 131 mg/dL
Hgb A1c MFr Bld: 6.2 % — ABNORMAL HIGH (ref 4.8–5.6)

## 2020-09-03 LAB — PSA: Prostate Specific Ag, Serum: 0.7 ng/mL (ref 0.0–4.0)

## 2020-09-04 DIAGNOSIS — R55 Syncope and collapse: Secondary | ICD-10-CM | POA: Diagnosis not present

## 2020-09-09 ENCOUNTER — Other Ambulatory Visit: Payer: Self-pay | Admitting: Internal Medicine

## 2020-09-26 DIAGNOSIS — H3581 Retinal edema: Secondary | ICD-10-CM | POA: Diagnosis not present

## 2020-09-26 DIAGNOSIS — H35033 Hypertensive retinopathy, bilateral: Secondary | ICD-10-CM | POA: Diagnosis not present

## 2020-09-26 DIAGNOSIS — Z947 Corneal transplant status: Secondary | ICD-10-CM | POA: Diagnosis not present

## 2020-09-26 DIAGNOSIS — H44521 Atrophy of globe, right eye: Secondary | ICD-10-CM | POA: Diagnosis not present

## 2020-09-26 DIAGNOSIS — H30033 Focal chorioretinal inflammation, peripheral, bilateral: Secondary | ICD-10-CM | POA: Diagnosis not present

## 2020-10-14 DIAGNOSIS — Z743 Need for continuous supervision: Secondary | ICD-10-CM | POA: Diagnosis not present

## 2020-10-14 DIAGNOSIS — R6889 Other general symptoms and signs: Secondary | ICD-10-CM | POA: Diagnosis not present

## 2020-10-14 DIAGNOSIS — R0789 Other chest pain: Secondary | ICD-10-CM | POA: Diagnosis not present

## 2020-10-14 DIAGNOSIS — R079 Chest pain, unspecified: Secondary | ICD-10-CM | POA: Diagnosis not present

## 2020-11-03 DIAGNOSIS — Z961 Presence of intraocular lens: Secondary | ICD-10-CM | POA: Diagnosis not present

## 2020-11-03 DIAGNOSIS — Z947 Corneal transplant status: Secondary | ICD-10-CM | POA: Diagnosis not present

## 2020-11-03 DIAGNOSIS — H30033 Focal chorioretinal inflammation, peripheral, bilateral: Secondary | ICD-10-CM | POA: Diagnosis not present

## 2020-11-03 DIAGNOSIS — H3581 Retinal edema: Secondary | ICD-10-CM | POA: Diagnosis not present

## 2020-11-03 DIAGNOSIS — H18421 Band keratopathy, right eye: Secondary | ICD-10-CM | POA: Diagnosis not present

## 2020-11-07 ENCOUNTER — Other Ambulatory Visit: Payer: Self-pay

## 2020-11-07 ENCOUNTER — Other Ambulatory Visit: Payer: Self-pay | Admitting: Internal Medicine

## 2020-11-07 MED ORDER — ATORVASTATIN CALCIUM 20 MG PO TABS
ORAL_TABLET | ORAL | 2 refills | Status: DC
Start: 2020-11-07 — End: 2021-02-06

## 2020-11-22 ENCOUNTER — Other Ambulatory Visit: Payer: Self-pay | Admitting: Internal Medicine

## 2021-01-19 DIAGNOSIS — H30033 Focal chorioretinal inflammation, peripheral, bilateral: Secondary | ICD-10-CM | POA: Diagnosis not present

## 2021-01-19 DIAGNOSIS — Z79899 Other long term (current) drug therapy: Secondary | ICD-10-CM | POA: Diagnosis not present

## 2021-01-22 ENCOUNTER — Ambulatory Visit: Payer: Medicare Other | Admitting: Internal Medicine

## 2021-01-22 ENCOUNTER — Other Ambulatory Visit: Payer: Self-pay

## 2021-01-22 ENCOUNTER — Ambulatory Visit (INDEPENDENT_AMBULATORY_CARE_PROVIDER_SITE_OTHER): Payer: Medicare Other

## 2021-01-22 VITALS — BP 102/60 | HR 65 | Temp 98.1°F | Ht 66.2 in | Wt 162.0 lb

## 2021-01-22 DIAGNOSIS — Z0000 Encounter for general adult medical examination without abnormal findings: Secondary | ICD-10-CM

## 2021-01-22 NOTE — Progress Notes (Signed)
This visit occurred during the SARS-CoV-2 public health emergency.  Safety protocols were in place, including screening questions prior to the visit, additional usage of staff PPE, and extensive cleaning of exam room while observing appropriate contact time as indicated for disinfecting solutions.  Subjective:   Steven [name] is a 65 y.o. male who presents for Medicare Annual/Subsequent preventive examination.  Review of Systems     Cardiac Risk Factors include: advanced age (>23men, >49 women);diabetes mellitus;hypertension;male gender     Objective:    Today's Vitals   01/22/21 0933 01/22/21 0938  BP: 102/60   Pulse: 65   Temp: 98.1 F (36.7 C)   TempSrc: Oral   SpO2: 97%   Weight: 162 lb (73.5 kg)   Height: 5' 6.2" (1.681 m)   PainSc:  5    Body mass index is 25.99 kg/m.  Advanced Directives 01/22/2021 01/10/2020 07/13/2019 01/04/2019 08/16/2016 09/21/2014 12/18/2011  Does Patient Have a Medical Advance Directive? No No No Yes No No Patient does not have advance directive  Type of Advance Directive - - - [place] in Chart? - - - No - copy requested - - -  Would patient like information on creating a medical advance directive? No - Patient declined No - Patient declined No - Patient declined - No - patient declined information No - patient declined information -  Pre-existing out of facility DNR order (yellow form or pink MOST form) - - - - - - No    Current Medications (verified) Outpatient Encounter Medications as of 01/22/2021  Medication Sig  . ACCU-CHEK FASTCLIX LANCETS MISC by Does not apply route.  Marland Kitchen aspirin EC 81 MG tablet Take 81 mg by mouth daily.  Marland Kitchen atorvastatin (LIPITOR) 20 MG tablet Take one tablet by mouth Monday through Friday  . Blood Glucose Monitoring Suppl (ACCU-CHEK GUIDE ME) w/Device KIT Inject 1 kit into the skin 2 (two) times a day. Dx: e11.22  . cycloSPORINE (SANDIMMUNE) 25 MG capsule Take  75 mg by mouth 2 (two) times daily.  . dorzolamide-timolol (COSOPT) 22.3-6.8 MG/ML ophthalmic solution Place 1 drop into the left eye 2 times daily.  . folic acid (FOLVITE) 1 MG tablet Take 1 mg by mouth daily.  Marland Kitchen glucose blood test strip 1 each by Other route as needed for other. Use as instructed  . latanoprost (XALATAN) 0.005 % ophthalmic solution PLACE 1 GTT INTO THE LEFT EYE NIGHTLY  . LINZESS 290 MCG CAPS capsule Take 290 mcg by mouth daily.  Marland Kitchen lisinopril (ZESTRIL) 10 MG tablet TAKE 1 TABLET BY MOUTH EVERY DAY  . methotrexate (RHEUMATREX) 2.5 MG tablet Take 10 mg by mouth once a week. Fridays. Caution:Chemotherapy. Protect from light.  . prednisoLONE acetate (PRED FORTE) 1 % ophthalmic suspension Place 1 drop into the left eye daily.  . pregabalin (LYRICA) 25 MG capsule TAKE 1 CAPSULE BY MOUTH TWICE DAILY  . sildenafil (REVATIO) 20 MG tablet Take one hour prior to sexual activity  . atorvastatin (LIPITOR) 10 MG tablet TAKE 1 TABLET BY MOUTH ONE TIME DAILY (Patient not taking: Reported on 01/22/2021)   No facility-administered encounter medications on file as of 01/22/2021.    Allergies (verified) Patient has no known allergies.   History: Past Medical History:  Diagnosis Date  . 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 CAD (coronary artery disease), known LAD stent placed in 2007 in [place]. ,last cath 2010 with patent stent and nonobstructive CAD 12/18/2011  .  Chest pain at rest 12/18/2011  . Diabetes mellitus   . DM (diabetes mellitus) ([place]) 12/18/2011  . Dyslipidemia 12/18/2011  . 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 History of corneal transplant, lt. eye with blurred vision 12/18/2011  . HTN (hypertension) 12/18/2011  . Hypertension    Past Surgical History:  Procedure Laterality Date  . CARDIAC SURGERY     stent placed in 2007  . CORNEAL TRANSPLANT    . CORONARY ANGIOPLASTY  2008   STENT PLACED IN LAD.MILD DIFFUSE IN-STENT RESTENOSIS IN 2010.  Marland Kitchen CORONARY STENT PLACEMENT    . MYOCARDIAL  PERFUSION STUDY  01/19/13   NORMAL STRESS NUCLEAR STUDY.  . TRANSTHORACIC ECHOCARDIOGRAM  07/28/2009   LV= MILD LVH. EF 60% TO 65%.  Marland Kitchen VASCULAR US  01/15/09   NORMAL LOWER ARTERIAL DOPPLER   Family History  Problem Relation Age of Onset  . Heart attack Mother   . Diabetes Mother   . Hypertension Father    Social History   Socioeconomic History  . Marital status: Divorced    Spouse name: Not on file  . Number of children: Not on file  . Years of education: Not on file  . Highest education level: Not on file  Occupational History  . Occupation: disability  Tobacco Use  . Smoking status: Former Smoker    Packs/day: 0.75    Years: 8.00    Pack years: 6.00    Types: Cigarettes    Quit date: 10/11/2001    Years since quitting: 19.2  . Smokeless tobacco: Never Used  Vaping Use  . Vaping Use: Never used  Substance and Sexual Activity  . Alcohol use: No    Alcohol/week: 0.0 standard drinks  . Drug use: Not Currently  . Sexual activity: Yes  Other Topics Concern  . Not on file  Social History Narrative  . Not on file   Social Determinants of Health   Financial Resource Strain: Low Risk   . Difficulty of Paying Living Expenses: Not hard at all  Food Insecurity: No Food Insecurity  . Worried About Charity fundraiser in the Last Year: Never true  . Ran Out of Food in the Last Year: Never true  Transportation Needs: No Transportation Needs  . Lack of Transportation (Medical): No  . Lack of Transportation (Non-Medical): No  Physical Activity: Sufficiently Active  . Days of Exercise per Week: 7 days  . Minutes of Exercise per Session: 60 min  Stress: No Stress Concern Present  . Feeling of Stress : Not at all  Social Connections: Not on file    Tobacco Counseling Counseling given: Not Answered   Clinical Intake:  Pre-visit preparation completed: Yes  Pain : 0-10 Pain Score: 5  Pain Type: Acute pain Pain Location: Leg Pain Orientation: Right Pain Descriptors /  Indicators: Aching Pain Onset: More than a month ago Pain Frequency: Intermittent     Nutritional Status: BMI 25 -29 Overweight Nutritional Risks: None Diabetes: Yes  How often do you need to have someone help you when you read instructions, pamphlets, or other written materials from your doctor or pharmacy?: 1 - Never What is the last grade level you completed in school?: 11th grade  Diabetic? Yes Nutrition Risk Assessment:  Has the patient had any N/V/D within the last 2 months?  No  Does the patient have any non-healing wounds?  No  Has the patient had any unintentional weight loss or weight gain?  No   Diabetes:  Is the patient diabetic?  Yes  If diabetic, was a CBG obtained today?  No  Did the patient bring in their glucometer from home?  No  How often do you monitor your CBG's? Once weekly.   Financial Strains and Diabetes Management:  Are you having any financial strains with the device, your supplies or your medication? No .  Does the patient want to be seen by Chronic Care Management for management of their diabetes?  No  Would the patient like to be referred to a Nutritionist or for Diabetic Management?  No   Diabetic Exams:  Diabetic Eye Exam: Overdue for diabetic eye exam. Pt has been advised about the importance in completing this exam. Patient advised to call and schedule an eye exam. Diabetic Foot Exam: Completed 09/02/2020   Interpreter Needed?: No  Information entered by :: NAllen LPN   Activities of Daily Living In your present state of health, do you have any difficulty performing the following activities: 01/22/2021  Hearing? N  Vision? N  Difficulty concentrating or making decisions? N  Walking or climbing stairs? N  Dressing or bathing? N  Doing errands, shopping? N  Preparing Food and eating ? N  Using the Toilet? N  In the past six months, have you accidently leaked urine? N  Do you have problems with loss of bowel control? N  Managing  your Medications? N  Managing your Finances? N  Housekeeping or managing your Housekeeping? N  Some recent data might be hidden    Patient Care Team: Glendale Chard, MD as PCP - General (Internal Medicine) Marilynne Halsted, MD as Referring Physician (Ophthalmology)  Indicate any recent Medical Services you may have received from other than [facility] providers in the past year (date may be approximate).     Assessment:   This is a routine wellness examination for [place].  Hearing/Vision screen  Hearing Screening   '125Hz'$  $Remo'250Hz'egjXU$'500Hz'$'1000Hz'$'2000Hz'$'3000Hz'$'4000Hz'$'6000Hz'$'8000Hz'$   Right ear:           Left ear:           Vision Screening Comments: Regular eye exams, [place]  Dietary issues and exercise activities discussed: Current Exercise Habits: Home exercise routine, Type of exercise: walking, Time (Minutes): 60, Frequency (Times/Week): 7, Weekly Exercise (Minutes/Week): 420  Goals    . Patient Stated     No goals    . Patient Stated     01/10/2020, wants to stay healthy    . Patient Stated     01/22/2021, no goals      Depression Screen PHQ 2/9 Scores 01/22/2021 09/02/2020 02/18/2020 01/10/2020 03/19/2019 01/04/2019 08/08/2018  PHQ - 2 Score 0 0 0 0 0 0 0  PHQ- 9 Score - - 0 0 - 0 -    Fall Risk Fall Risk  01/22/2021 02/18/2020 01/10/2020 03/19/2019 01/04/2019  Falls in the past year? 0 0 0 0 0  Number falls in past yr: - 0 - - -  Injury with Fall? - 0 - - -  Risk for fall due to : Medication side effect - Medication side effect - Medication side effect  Follow up Falls evaluation completed;Education provided;Falls prevention discussed - Falls evaluation completed;Education provided;Falls prevention discussed - Falls prevention discussed    FALL RISK PREVENTION PERTAINING TO THE HOME:  Any stairs in or around the home? Yes  If so, are there any without handrails? No  Home free of loose throw rugs in walkways, pet  beds, electrical cords, etc? Yes  Adequate lighting in  your home to reduce risk of falls? Yes   ASSISTIVE DEVICES UTILIZED TO PREVENT FALLS:  Life alert? No  Use of a cane, walker or w/c? No  Grab bars in the bathroom? No  Shower chair or bench in shower? No  Elevated toilet seat or a handicapped toilet? No   TIMED UP AND GO:  Was the test performed? No .     Gait steady and fast without use of assistive device  Cognitive Function:     6CIT Screen 01/22/2021 01/10/2020 01/04/2019  What Year? 0 points 0 points 0 points  What month? 0 points 0 points 0 points  What time? 0 points 0 points 0 points  Count back from 20 0 points 0 points 0 points  Months in reverse 0 points 0 points 0 points  Repeat phrase 8 points 2 points 0 points  Total Score 8 2 0    Immunizations  There is no immunization history on file for this patient.  TDAP status: Up to date  Flu Vaccine status: Declined, Education has been provided regarding the importance of this vaccine but patient still declined. Advised may receive this vaccine at local pharmacy or Health Dept. Aware to provide a copy of the vaccination record if obtained from local pharmacy or Health Dept. Verbalized acceptance and understanding.  Pneumococcal vaccine status: Declined,  Education has been provided regarding the importance of this vaccine but patient still declined. Advised may receive this vaccine at local pharmacy or Health Dept. Aware to provide a copy of the vaccination record if obtained from local pharmacy or Health Dept. Verbalized acceptance and understanding.   Covid-19 vaccine status: Declined, Education has been provided regarding the importance of this vaccine but patient still declined. Advised may receive this vaccine at local pharmacy or Health Dept.or vaccine clinic. Aware to provide a copy of the vaccination record if obtained from local pharmacy or Health Dept. Verbalized acceptance and understanding.  Qualifies for Shingles Vaccine? Yes   Zostavax completed No    Shingrix Completed?: No.    Education has been provided regarding the importance of this vaccine. Patient has been advised to call insurance company to determine out of pocket expense if they have not yet received this vaccine. Advised may also receive vaccine at local pharmacy or Health Dept. Verbalized acceptance and understanding.  Screening Tests Health Maintenance  Topic Date Due  . OPHTHALMOLOGY EXAM  07/02/2020  . COVID-19 Vaccine (1) 02/07/2021 (Originally 05/27/1968)  . HIV Screening  09/02/2021 (Originally 05/27/1971)  . PNEUMOCOCCAL POLYSACCHARIDE VACCINE AGE 40-64 HIGH RISK  01/22/2022 (Originally 05/28/1958)  . HEMOGLOBIN A1C  03/02/2021  . INFLUENZA VACCINE  05/11/2021  . FOOT EXAM  09/02/2021  . COLONOSCOPY (Pts 45-32yrs Insurance coverage will need to be confirmed)  07/03/2023  . TETANUS/TDAP  07/07/2028  . Hepatitis C Screening  Completed  . HPV VACCINES  Aged Out    Health Maintenance  Health Maintenance Due  Topic Date Due  . OPHTHALMOLOGY EXAM  07/02/2020    Colorectal cancer screening: Type of screening: Colonoscopy. Completed 07/02/2013. Repeat every 10 years  Lung Cancer Screening: (Low Dose CT Chest recommended if Age 46-80 years, 30 pack-year currently smoking OR have quit w/in 15years.) does not qualify.   Lung Cancer Screening Referral: no  Additional Screening:  Hepatitis C Screening: does qualify; Completed 2/28/20211  Vision Screening: Recommended annual ophthalmology exams for early detection of glaucoma and other disorders of the eye.  Is the patient up to date with their annual eye exam?  No  Who is the provider or what is the name of the office in which the patient attends annual eye exams? [place] If pt is not established with a provider, would they like to be referred to a provider to establish care? No .   Dental Screening: Recommended annual dental exams for proper oral hygiene  Community Resource Referral / Chronic Care  Management: CRR required this visit?  No   CCM required this visit?  No      Plan:     I have personally reviewed and noted the following in the patient's chart:   . Medical and social history . Use of alcohol, tobacco or illicit drugs  . Current medications and supplements . Functional ability and status . Nutritional status . Physical activity . Advanced directives . List of other physicians . Hospitalizations, surgeries, and ER visits in previous 12 months . Vitals . Screenings to include cognitive, depression, and falls . Referrals and appointments  In addition, I have reviewed and discussed with patient certain preventive protocols, quality metrics, and best practice recommendations. A written personalized care plan for preventive services as well as general preventive health recommendations were provided to patient.     Kellie Simmering, LPN   [id number]   Nurse Notes:

## 2021-01-22 NOTE — Patient Instructions (Signed)
Steven [name] , Thank you for taking time to come for your Medicare Wellness Visit. I appreciate your ongoing commitment to your health goals. Please review the following plan we discussed and let me know if I can assist you in the future.   Screening recommendations/referrals: Colonoscopy: completed 07/02/2013 Recommended yearly ophthalmology/optometry visit for glaucoma screening and checkup Recommended yearly dental visit for hygiene and checkup  Vaccinations: Influenza vaccine: decline Pneumococcal vaccine: decline Tdap vaccine: completed 07/07/2018, due 07/07/2028 Shingles vaccine: decline   Covid-19:  decline  Advanced directives: Advance directive discussed with you today. Even though you declined this today please call our office should you change your mind and we can give you the proper paperwork for you to fill out.  Conditions/risks identified: none  Next appointment: Follow up in one year for your annual wellness visit   Preventive Care 40-64 Years, Male Preventive care refers to lifestyle choices and visits with your health care provider that can promote health and wellness. What does preventive care include?  A yearly physical exam. This is also called an annual well check.  Dental exams once or twice a year.  Routine eye exams. Ask your health care provider how often you should have your eyes checked.  Personal lifestyle choices, including:  Daily care of your teeth and gums.  Regular physical activity.  Eating a healthy diet.  Avoiding tobacco and drug use.  Limiting alcohol use.  Practicing safe sex.  Taking low-dose aspirin every day starting at age 26. What happens during an annual well check? The services and screenings done by your health care provider during your annual well check will depend on your age, overall health, lifestyle risk factors, and family history of disease. Counseling  Your health care provider may ask you questions about  your:  Alcohol use.  Tobacco use.  Drug use.  Emotional well-being.  Home and relationship well-being.  Sexual activity.  Eating habits.  Work and work Astronomer. Screening  You may have the following tests or measurements:  Height, weight, and BMI.  Blood pressure.  Lipid and cholesterol levels. These may be checked every 5 years, or more frequently if you are over 29 years old.  Skin check.  Lung cancer screening. You may have this screening every year starting at age 48 if you have a 30-pack-year history of smoking and currently smoke or have quit within the past 15 years.  Fecal occult blood test (FOBT) of the stool. You may have this test every year starting at age 64.  Flexible sigmoidoscopy or colonoscopy. You may have a sigmoidoscopy every 5 years or a colonoscopy every 10 years starting at age 17.  Prostate cancer screening. Recommendations will vary depending on your family history and other risks.  Hepatitis C blood test.  Hepatitis B blood test.  Sexually transmitted disease (STD) testing.  Diabetes screening. This is done by checking your blood sugar (glucose) after you have not eaten for a while (fasting). You may have this done every 1-3 years. Discuss your test results, treatment options, and if necessary, the need for more tests with your health care provider. Vaccines  Your health care provider may recommend certain vaccines, such as:  Influenza vaccine. This is recommended every year.  Tetanus, diphtheria, and acellular pertussis (Tdap, Td) vaccine. You may need a Td booster every 10 years.  Zoster vaccine. You may need this after age 79.  Pneumococcal 13-valent conjugate (PCV13) vaccine. You may need this if you have certain conditions and have  not been vaccinated.  Pneumococcal polysaccharide (PPSV23) vaccine. You may need one or two doses if you smoke cigarettes or if you have certain conditions. Talk to your health care provider about  which screenings and vaccines you need and how often you need them. This information is not intended to replace advice given to you by your health care provider. Make sure you discuss any questions you have with your health care provider. Document Released: 10/24/2015 Document Revised: 06/16/2016 Document Reviewed: 07/29/2015 Elsevier Interactive Patient Education  2017 [employer].  Fall Prevention in the Home Falls can cause injuries. They can happen to people of all ages. There are many things you can do to make your home safe and to help prevent falls. What can I do on the outside of my home?  Regularly fix the edges of walkways and driveways and fix any cracks.  Remove anything that might make you trip as you walk through a door, such as a raised step or threshold.  Trim any bushes or trees on the path to your home.  Use bright outdoor lighting.  Clear any walking paths of anything that might make someone trip, such as rocks or tools.  Regularly check to see if handrails are loose or broken. Make sure that both sides of any steps have handrails.  Any raised decks and porches should have guardrails on the edges.  Have any leaves, snow, or ice cleared regularly.  Use sand or salt on walking paths during winter.  Clean up any spills in your garage right away. This includes oil or grease spills. What can I do in the bathroom?  Use night lights.  Install grab bars by the toilet and in the tub and shower. Do not use towel bars as grab bars.  Use non-skid mats or decals in the tub or shower.  If you need to sit down in the shower, use a plastic, non-slip stool.  Keep the floor dry. Clean up any water that spills on the floor as soon as it happens.  Remove soap buildup in the tub or shower regularly.  Attach bath mats securely with double-sided non-slip rug tape.  Do not have throw rugs and other things on the floor that can make you trip. What can I do in the  bedroom?  Use night lights.  Make sure that you have a light by your bed that is easy to reach.  Do not use any sheets or blankets that are too big for your bed. They should not hang down onto the floor.  Have a firm chair that has side arms. You can use this for support while you get dressed.  Do not have throw rugs and other things on the floor that can make you trip. What can I do in the kitchen?  Clean up any spills right away.  Avoid walking on wet floors.  Keep items that you use a lot in easy-to-reach places.  If you need to reach something above you, use a strong step stool that has a grab bar.  Keep electrical cords out of the way.  Do not use floor polish or wax that makes floors slippery. If you must use wax, use non-skid floor wax.  Do not have throw rugs and other things on the floor that can make you trip. What can I do with my stairs?  Do not leave any items on the stairs.  Make sure that there are handrails on both sides of the stairs and use  them. Fix handrails that are broken or loose. Make sure that handrails are as long as the stairways.  Check any carpeting to make sure that it is firmly attached to the stairs. Fix any carpet that is loose or worn.  Avoid having throw rugs at the top or bottom of the stairs. If you do have throw rugs, attach them to the floor with carpet tape.  Make sure that you have a light switch at the top of the stairs and the bottom of the stairs. If you do not have them, ask someone to add them for you. What else can I do to help prevent falls?  Wear shoes that:  Do not have high heels.  Have rubber bottoms.  Are comfortable and fit you well.  Are closed at the toe. Do not wear sandals.  If you use a stepladder:  Make sure that it is fully opened. Do not climb a closed stepladder.  Make sure that both sides of the stepladder are locked into place.  Ask someone to hold it for you, if possible.  Clearly mark and make  sure that you can see:  Any grab bars or handrails.  First and last steps.  Where the edge of each step is.  Use tools that help you move around (mobility aids) if they are needed. These include:  Canes.  Walkers.  Scooters.  Crutches.  Turn on the lights when you go into a dark area. Replace any light bulbs as soon as they burn out.  Set up your furniture so you have a clear path. Avoid moving your furniture around.  If any of your floors are uneven, fix them.  If there are any pets around you, be aware of where they are.  Review your medicines with your doctor. Some medicines can make you feel dizzy. This can increase your chance of falling. Ask your doctor what other things that you can do to help prevent falls. This information is not intended to replace advice given to you by your health care provider. Make sure you discuss any questions you have with your health care provider. Document Released: 07/24/2009 Document Revised: 03/04/2016 Document Reviewed: 11/01/2014 Elsevier Interactive Patient Education  2017 [employer].

## 2021-01-23 DIAGNOSIS — Z947 Corneal transplant status: Secondary | ICD-10-CM | POA: Diagnosis not present

## 2021-01-23 DIAGNOSIS — H44521 Atrophy of globe, right eye: Secondary | ICD-10-CM | POA: Diagnosis not present

## 2021-01-23 DIAGNOSIS — H402224 Chronic angle-closure glaucoma, left eye, indeterminate stage: Secondary | ICD-10-CM | POA: Diagnosis not present

## 2021-01-23 DIAGNOSIS — H35033 Hypertensive retinopathy, bilateral: Secondary | ICD-10-CM | POA: Diagnosis not present

## 2021-01-23 DIAGNOSIS — H4042X2 Glaucoma secondary to eye inflammation, left eye, moderate stage: Secondary | ICD-10-CM | POA: Diagnosis not present

## 2021-01-23 DIAGNOSIS — H209 Unspecified iridocyclitis: Secondary | ICD-10-CM | POA: Diagnosis not present

## 2021-01-23 DIAGNOSIS — H30033 Focal chorioretinal inflammation, peripheral, bilateral: Secondary | ICD-10-CM | POA: Diagnosis not present

## 2021-01-23 DIAGNOSIS — H3581 Retinal edema: Secondary | ICD-10-CM | POA: Diagnosis not present

## 2021-01-23 DIAGNOSIS — Z79899 Other long term (current) drug therapy: Secondary | ICD-10-CM | POA: Diagnosis not present

## 2021-01-23 LAB — HM DIABETES EYE EXAM

## 2021-02-05 ENCOUNTER — Other Ambulatory Visit: Payer: Self-pay | Admitting: Internal Medicine

## 2021-02-26 DIAGNOSIS — R140 Abdominal distension (gaseous): Secondary | ICD-10-CM | POA: Diagnosis not present

## 2021-02-26 DIAGNOSIS — K5904 Chronic idiopathic constipation: Secondary | ICD-10-CM | POA: Diagnosis not present

## 2021-03-02 ENCOUNTER — Ambulatory Visit (INDEPENDENT_AMBULATORY_CARE_PROVIDER_SITE_OTHER): Payer: Medicare Other | Admitting: Internal Medicine

## 2021-03-02 ENCOUNTER — Other Ambulatory Visit: Payer: Self-pay

## 2021-03-02 ENCOUNTER — Encounter: Payer: Self-pay | Admitting: Internal Medicine

## 2021-03-02 VITALS — BP 122/78 | HR 56 | Temp 98.4°F | Ht 66.2 in | Wt 162.2 lb

## 2021-03-02 DIAGNOSIS — E1141 Type 2 diabetes mellitus with diabetic mononeuropathy: Secondary | ICD-10-CM | POA: Diagnosis not present

## 2021-03-02 DIAGNOSIS — E7800 Pure hypercholesterolemia, unspecified: Secondary | ICD-10-CM | POA: Diagnosis not present

## 2021-03-02 DIAGNOSIS — Z2821 Immunization not carried out because of patient refusal: Secondary | ICD-10-CM | POA: Diagnosis not present

## 2021-03-02 DIAGNOSIS — I119 Hypertensive heart disease without heart failure: Secondary | ICD-10-CM | POA: Diagnosis not present

## 2021-03-02 DIAGNOSIS — I2510 Atherosclerotic heart disease of native coronary artery without angina pectoris: Secondary | ICD-10-CM | POA: Diagnosis not present

## 2021-03-02 LAB — HEMOGLOBIN A1C
Est. average glucose Bld gHb Est-mCnc: 131 mg/dL
Hgb A1c MFr Bld: 6.2 % — ABNORMAL HIGH (ref 4.8–5.6)

## 2021-03-02 MED ORDER — ACCU-CHEK GUIDE ME W/DEVICE KIT
1.0000 | PACK | Freq: Every day | 1 refills | Status: DC
Start: 2021-03-02 — End: 2021-03-31

## 2021-03-02 NOTE — Progress Notes (Signed)
I,Katawbba Wiggins,acting as a Education administrator for [name], MD.,have documented all relevant documentation on the behalf of [name], MD,as directed by  [name], MD while in the presence of [name], MD.  This visit occurred during the SARS-CoV-2 public health emergency.  Safety protocols were in place, including screening questions prior to the visit, additional usage of staff PPE, and extensive cleaning of exam room while observing appropriate contact time as indicated for disinfecting solutions.  Subjective:     Patient ID: Steven [name] , male    DOB: Mar 24, 1956 , 65 y.o.   MRN: [id number]   Chief Complaint  Patient presents with  . Diabetes  . Hypertension  . Hyperlipidemia    HPI  He is here today for a DM/BP/cholesterol check. He is no longer taking meds for diabetes. He has been able to control his diabetes with lifestyle changes. He reports compliance with meds. States last eye exam was April 2022.   Diabetes He presents for his follow-up diabetic visit. He has type 2 diabetes mellitus. Pertinent negatives for diabetes include no blurred vision and no chest pain. There are no hypoglycemic complications. Risk factors for coronary artery disease include diabetes mellitus, dyslipidemia, male sex and hypertension. Current diabetic treatment includes diet. His weight is stable. He is following a diabetic diet. He participates in exercise daily. He does not see a podiatrist.Eye exam is current.  Hypertension This is a chronic problem. The current episode started more than 1 year ago. Pertinent negatives include no blurred vision or chest pain. Risk factors for coronary artery disease include male gender, diabetes mellitus and dyslipidemia. Hypertensive end-organ damage includes CAD/MI.     Past Medical History:  Diagnosis Date  . 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 CAD (coronary artery disease), known LAD stent placed in 2007 in [place]. ,last cath 2010 with patent stent  and nonobstructive CAD 12/18/2011  . Chest pain at rest 12/18/2011  . Diabetes mellitus   . DM (diabetes mellitus) ([place]) 12/18/2011  . Dyslipidemia 12/18/2011  . 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 History of corneal transplant, lt. eye with blurred vision 12/18/2011  . HTN (hypertension) 12/18/2011  . Hypertension      Family History  Problem Relation Age of Onset  . Heart attack Mother   . Diabetes Mother   . Hypertension Father      Current Outpatient Medications:  .  atorvastatin (LIPITOR) 20 MG tablet, TAKE 1 TABLET BY MOUTH MONDAY- FRIDAY, Disp: 30 tablet, Rfl: 2 .  cycloSPORINE (SANDIMMUNE) 25 MG capsule, Take 75 mg by mouth 2 (two) times daily., Disp: , Rfl:  .  dorzolamide-timolol (COSOPT) 22.3-6.8 MG/ML ophthalmic solution, Place 1 drop into the left eye 2 times daily., Disp: , Rfl:  .  folic acid (FOLVITE) 1 MG tablet, Take 1 mg by mouth daily., Disp: , Rfl:  .  glucose blood test strip, 1 each by Other route as needed for other. Use as instructed, Disp: , Rfl:  .  latanoprost (XALATAN) 0.005 % ophthalmic solution, PLACE 1 GTT INTO THE LEFT EYE NIGHTLY, Disp: , Rfl:  .  LINZESS 290 MCG CAPS capsule, Take 290 mcg by mouth daily., Disp: , Rfl:  .  lisinopril (ZESTRIL) 10 MG tablet, TAKE 1 TABLET BY MOUTH EVERY DAY, Disp: 90 tablet, Rfl: 1 .  methotrexate (RHEUMATREX) 2.5 MG tablet, Take 10 mg by mouth once a week. Fridays. Caution:Chemotherapy. Protect from light., Disp: , Rfl:  .  prednisoLONE acetate (  PRED FORTE) 1 % ophthalmic suspension, Place 1 drop into the left eye daily., Disp: , Rfl:  .  pregabalin (LYRICA) 25 MG capsule, TAKE 1 CAPSULE BY MOUTH TWICE DAILY, Disp: 180 capsule, Rfl: 1 .  sildenafil (REVATIO) 20 MG tablet, Take one hour prior to sexual activity, Disp: 10 tablet, Rfl: 0 .  ACCU-CHEK FASTCLIX LANCETS MISC, by Does not apply route., Disp: , Rfl:  .  aspirin EC 81 MG tablet, Take 81 mg by mouth daily., Disp: , Rfl:  .  Blood Glucose Monitoring Suppl (ACCU-CHEK  GUIDE ME) w/Device KIT, Inject 1 kit into the skin daily. Dx: e11.22, Disp: 1 kit, Rfl: 1   No Known Allergies   Review of Systems  Constitutional: Negative.   Eyes: Negative for blurred vision.  Respiratory: Negative.   Cardiovascular: Negative.  Negative for chest pain.  Gastrointestinal: Negative.   Psychiatric/Behavioral: Negative.   All other systems reviewed and are negative.    Today's Vitals   03/02/21 1026  BP: 122/78  Pulse: (!) 56  Temp: 98.4 F (36.9 C)  TempSrc: Oral  Weight: 162 lb 3.2 oz (73.6 kg)  Height: 5' 6.2" (1.681 m)  PainSc: 0-No pain   Body mass index is 26.02 kg/m.  Wt Readings from Last 3 Encounters:  03/02/21 162 lb 3.2 oz (73.6 kg)  01/22/21 162 lb (73.5 kg)  09/02/20 161 lb 12.8 oz (73.4 kg)   BP Readings from Last 3 Encounters:  03/02/21 122/78  01/22/21 102/60  09/02/20 136/78   Objective:  Physical Exam Vitals and nursing note reviewed.  Constitutional:      Appearance: Normal appearance.  HENT:     Head: Normocephalic and atraumatic.     Nose:     Comments: Masked     Mouth/Throat:     Comments: Masked  Cardiovascular:     Rate and Rhythm: Normal rate and regular rhythm.     Heart sounds: Normal heart sounds.  Pulmonary:     Effort: Pulmonary effort is normal.     Breath sounds: Normal breath sounds.  Skin:    General: Skin is warm.  Neurological:     General: No focal deficit present.     Mental Status: He is alert.  Psychiatric:        Mood and Affect: Mood normal.         Assessment And Plan:     1. Diabetic mononeuropathy associated with type 2 diabetes mellitus ([place]) Comments: Chronic, I will check labs as listed below. I will also request his most recent eye exam.  - Hemoglobin A1c - Lipid panel - CMP14+EGFR  2. Benign hypertensive heart disease without heart failure Comments: Chroni, well controlled. He is encouraged to follow low sodium diet. He will f/u in six months at previously scheduled CPE.  -  CMP14+EGFR  3. Atherosclerosis of native coronary artery of native heart without angina pectoris Comments: Chronic, encouraged to follow heart healthy lifestyle - regular exercise, limited fried food intake and Mediterranean diet.   4. Pure hypercholesterolemia Comments: Chronic. I will check lipid panel and LFTs today. He will rto in six months for a full physical examination.  - Lipid panel - TSH  5. COVID-19 vaccine dose declined  6. Vaccination declined by patient Comments: He also declines Pneumovax, Shingrix and Tetanus vaccines.    Patient was given opportunity to ask questions. Patient verbalized understanding of the plan and was able to repeat key elements of the plan. All questions were answered to  their satisfaction.   I, Glendale Chard, MD, have reviewed all documentation for this visit. The documentation on 03/02/21 for the exam, diagnosis, procedures, and orders are all accurate and complete.   IF YOU HAVE BEEN REFERRED TO A SPECIALIST, IT MAY TAKE 1-2 WEEKS TO SCHEDULE/PROCESS THE REFERRAL. IF YOU HAVE NOT HEARD FROM US/SPECIALIST IN TWO WEEKS, PLEASE GIVE [place] A CALL AT [phone].   THE PATIENT IS ENCOURAGED TO PRACTICE SOCIAL DISTANCING DUE TO THE COVID-19 PANDEMIC.

## 2021-03-02 NOTE — Patient Instructions (Signed)

## 2021-03-03 ENCOUNTER — Encounter: Payer: Self-pay | Admitting: Internal Medicine

## 2021-03-03 LAB — CMP14+EGFR
ALT: 23 IU/L (ref 0–44)
AST: 35 IU/L (ref 0–40)
Albumin/Globulin Ratio: 1.7 (ref 1.2–2.2)
Albumin: 4.7 g/dL (ref 3.8–4.8)
Alkaline Phosphatase: 52 IU/L (ref 44–121)
BUN/Creatinine Ratio: 10 (ref 10–24)
BUN: 12 mg/dL (ref 8–27)
Bilirubin Total: 0.8 mg/dL (ref 0.0–1.2)
CO2: 20 mmol/L (ref 20–29)
Calcium: 9.9 mg/dL (ref 8.6–10.2)
Chloride: 98 mmol/L (ref 96–106)
Creatinine, Ser: 1.16 mg/dL (ref 0.76–1.27)
Globulin, Total: 2.8 g/dL (ref 1.5–4.5)
Glucose: 103 mg/dL — ABNORMAL HIGH (ref 65–99)
Sodium: 136 mmol/L (ref 134–144)
Total Protein: 7.5 g/dL (ref 6.0–8.5)
eGFR: 71 mL/min/{1.73_m2} (ref >59)

## 2021-03-03 LAB — TSH: TSH: 1.09 u[IU]/mL (ref 0.450–4.500)

## 2021-03-03 LAB — LIPID PANEL
Chol/HDL Ratio: 2.6 ratio (ref 0.0–5.0)
Cholesterol, Total: 194 mg/dL (ref 100–199)
HDL: 75 mg/dL (ref >39)
LDL Chol Calc (NIH): 108 mg/dL — ABNORMAL HIGH (ref 0–99)
Triglycerides: 60 mg/dL (ref 0–149)
VLDL Cholesterol Cal: 11 mg/dL (ref 5–40)

## 2021-03-18 ENCOUNTER — Emergency Department (HOSPITAL_COMMUNITY): Payer: Medicare Other

## 2021-03-18 ENCOUNTER — Emergency Department (HOSPITAL_COMMUNITY)
Admission: EM | Admit: 2021-03-18 | Discharge: 2021-03-18 | Disposition: A | Discharge status: Home / Self Care | Payer: Medicare Other | Attending: Emergency Medicine | Admitting: Emergency Medicine

## 2021-03-18 ENCOUNTER — Encounter (HOSPITAL_COMMUNITY): Payer: Self-pay

## 2021-03-18 ENCOUNTER — Other Ambulatory Visit: Payer: Self-pay

## 2021-03-18 DIAGNOSIS — E1141 Type 2 diabetes mellitus with diabetic mononeuropathy: Secondary | ICD-10-CM | POA: Insufficient documentation

## 2021-03-18 DIAGNOSIS — Z7982 Long term (current) use of aspirin: Secondary | ICD-10-CM | POA: Insufficient documentation

## 2021-03-18 DIAGNOSIS — R079 Chest pain, unspecified: Secondary | ICD-10-CM | POA: Diagnosis not present

## 2021-03-18 DIAGNOSIS — Z951 Presence of aortocoronary bypass graft: Secondary | ICD-10-CM | POA: Insufficient documentation

## 2021-03-18 DIAGNOSIS — Z79899 Other long term (current) drug therapy: Secondary | ICD-10-CM | POA: Insufficient documentation

## 2021-03-18 DIAGNOSIS — R0789 Other chest pain: Secondary | ICD-10-CM | POA: Diagnosis not present

## 2021-03-18 DIAGNOSIS — I499 Cardiac arrhythmia, unspecified: Secondary | ICD-10-CM | POA: Diagnosis not present

## 2021-03-18 DIAGNOSIS — I2510 Atherosclerotic heart disease of native coronary artery without angina pectoris: Secondary | ICD-10-CM | POA: Insufficient documentation

## 2021-03-18 DIAGNOSIS — Z743 Need for continuous supervision: Secondary | ICD-10-CM | POA: Diagnosis not present

## 2021-03-18 DIAGNOSIS — R404 Transient alteration of awareness: Secondary | ICD-10-CM | POA: Diagnosis not present

## 2021-03-18 DIAGNOSIS — R6889 Other general symptoms and signs: Secondary | ICD-10-CM | POA: Diagnosis not present

## 2021-03-18 DIAGNOSIS — R42 Dizziness and giddiness: Secondary | ICD-10-CM | POA: Diagnosis not present

## 2021-03-18 DIAGNOSIS — Z87891 Personal history of nicotine dependence: Secondary | ICD-10-CM | POA: Diagnosis not present

## 2021-03-18 DIAGNOSIS — I10 Essential (primary) hypertension: Secondary | ICD-10-CM | POA: Diagnosis not present

## 2021-03-18 LAB — CBC WITH DIFFERENTIAL/PLATELET
Abs Immature Granulocytes: 0.01 10*3/uL (ref 0.00–0.07)
Basophils Absolute: 0 10*3/uL (ref 0.0–0.1)
Basophils Relative: 1 %
Eosinophils Absolute: 0 10*3/uL (ref 0.0–0.5)
Eosinophils Relative: 1 %
HCT: 41.2 % (ref 39.0–52.0)
Hemoglobin: 13.9 g/dL (ref 13.0–17.0)
Immature Granulocytes: 0 %
Lymphocytes Relative: 28 %
Lymphs Abs: 1.2 10*3/uL (ref 0.7–4.0)
MCH: 33.3 pg (ref 26.0–34.0)
MCHC: 33.7 g/dL (ref 30.0–36.0)
MCV: 98.8 fL (ref 80.0–100.0)
Monocytes Absolute: 0.5 10*3/uL (ref 0.1–1.0)
Monocytes Relative: 12 %
Neutro Abs: 2.5 10*3/uL (ref 1.7–7.7)
Neutrophils Relative %: 58 %
Platelets: 106 10*3/uL — ABNORMAL LOW (ref 150–400)
RBC: 4.17 MIL/uL — ABNORMAL LOW (ref 4.22–5.81)
RDW: 13.2 % (ref 11.5–15.5)
WBC: 4.2 10*3/uL (ref 4.0–10.5)
nRBC: 0 % (ref 0.0–0.2)

## 2021-03-18 LAB — BASIC METABOLIC PANEL
Anion gap: 7 (ref 5–15)
BUN: 15 mg/dL (ref 8–23)
CO2: 22 mmol/L (ref 22–32)
Calcium: 9 mg/dL (ref 8.9–10.3)
Chloride: 107 mmol/L (ref 98–111)
Creatinine, Ser: 1.03 mg/dL (ref 0.61–1.24)
GFR, Estimated: >60 mL/min (ref >60)
Glucose, Bld: 99 mg/dL (ref 70–99)
Potassium: 4.5 mmol/L (ref 3.5–5.1)
Sodium: 136 mmol/L (ref 135–145)

## 2021-03-18 LAB — TROPONIN I (HIGH SENSITIVITY)
Troponin I (High Sensitivity): 4 ng/L (ref <18)
Troponin I (High Sensitivity): 5 ng/L (ref <18)

## 2021-03-18 MED ORDER — NITROGLYCERIN 0.4 MG SL SUBL: 0.4000 mg | SUBLINGUAL_TABLET | SUBLINGUAL | Status: DC | PRN

## 2021-03-18 NOTE — ED Notes (Signed)
Tried to do standing BP however pt became rocking back and forth couldnt finnish the VS

## 2021-03-18 NOTE — ED Provider Notes (Signed)
[facility] EMERGENCY DEPARTMENT Provider Note   CSN: [id number] Arrival date & time: 03/18/21  1025     History No chief complaint on file.   Steven [name] is a 65 y.o. male with PMHx HTN, HLD, Diabetes, CAD s/p stent, legal blindness who presents to the ED today via EMS with complaint of gradual onset, constant, left sided chest pain that he describes as a sharp/pressure sensation since around 8:30 AM this morning. Pt reports he woke up 30 minutes earlier and felt lightheaded/like he was going to pass out. He was provided with 324 mg ASA and 1 sublingual NTG with mild improvement of his chest pain. Currently a 3/10 however still present. Pt denies fevers, chills, headache, dizziness, SOB, nausea, vomiting, diaphoresis, or any other associated symptoms.   The history is provided by the patient and medical records.       Past Medical History:  Diagnosis Date  . 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 CAD (coronary artery disease), known LAD stent placed in 2007 in [place]. ,last cath 2010 with patent stent and nonobstructive CAD 12/18/2011  . Chest pain at rest 12/18/2011  . Diabetes mellitus   . DM (diabetes mellitus) ([place]) 12/18/2011  . Dyslipidemia 12/18/2011  . 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 History of corneal transplant, lt. eye with blurred vision 12/18/2011  . HTN (hypertension) 12/18/2011  . Hypertension     Patient Active Problem List   Diagnosis Date Noted  . Diabetic mononeuropathy associated with type 2 diabetes mellitus ([place]) 08/21/2019  . Benign hypertensive heart disease without heart failure 08/21/2019  . Atherosclerosis of native coronary artery of native heart without angina pectoris 08/21/2019  . Unexplained night sweats 09/02/2015  . Chronic constipation 12/21/2011  . Chest pain at rest 12/18/2011  . CAD s/p LAD stent 2007 12/18/2011  . 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 History of corneal transplant, lt. eye with blurred vision 12/18/2011  . DM  (diabetes mellitus) ([place]) 12/18/2011  . HTN (hypertension) 12/18/2011  . Dyslipidemia 12/18/2011  . 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 CARDIAC SURGERY     stent placed in 2007  . CORNEAL TRANSPLANT    . CORONARY ANGIOPLASTY  2008   STENT PLACED IN LAD.MILD DIFFUSE IN-STENT RESTENOSIS IN 2010.  Marland Kitchen CORONARY STENT PLACEMENT    . MYOCARDIAL PERFUSION STUDY  01/19/13   NORMAL STRESS NUCLEAR STUDY.  . TRANSTHORACIC ECHOCARDIOGRAM  07/28/2009   LV= MILD LVH. EF 60% TO 65%.  Marland Kitchen VASCULAR US  01/15/09   NORMAL LOWER ARTERIAL DOPPLER       Family History  Problem Relation Age of Onset  . Heart attack Mother   . Diabetes Mother   . Hypertension Father     Social History   Tobacco Use  . Smoking status: Former Smoker    Packs/day: 0.75    Years: 20.00    Pack years: 15.00    Types: Cigarettes    Start date: 10/11/1981    Quit date: 10/11/2001    Years since quitting: 19.4  . Smokeless tobacco: Never Used  Vaping Use  . Vaping Use: Never used  Substance Use Topics  . Alcohol use: No    Alcohol/week: 0.0 standard drinks  . Drug use: Not Currently    Home Medications Prior to Admission medications   Medication Sig Start Date End Date Taking? Authorizing Provider  ACCU-CHEK FASTCLIX LANCETS MISC by Does not apply  route.    [provider]  aspirin EC 81 MG tablet Take 81 mg by mouth daily.    [provider]  atorvastatin (LIPITOR) 20 MG tablet TAKE 1 TABLET BY MOUTH MONDAY- FRIDAY 02/06/21   Glendale Chard, MD  Blood Glucose Monitoring Suppl (ACCU-CHEK GUIDE ME) w/Device KIT Inject 1 kit into the skin daily. Dx: e11.22 03/02/21   Glendale Chard, MD  cycloSPORINE (SANDIMMUNE) 25 MG capsule Take 75 mg by mouth 2 (two) times daily.    [provider]  dorzolamide-timolol (COSOPT) 22.3-6.8 MG/ML ophthalmic solution Place 1 drop into the left eye 2 times daily. 07/03/19   [provider]  folic acid  (FOLVITE) 1 MG tablet Take 1 mg by mouth daily.    [provider]  glucose blood test strip 1 each by Other route as needed for other. Use as instructed    [provider]  latanoprost (XALATAN) 0.005 % ophthalmic solution PLACE 1 GTT INTO THE LEFT EYE NIGHTLY 07/03/19   [provider]  LINZESS 290 MCG CAPS capsule Take 290 mcg by mouth daily. 12/10/13   [provider]  lisinopril (ZESTRIL) 10 MG tablet TAKE 1 TABLET BY MOUTH EVERY DAY 09/09/20   Glendale Chard, MD  methotrexate (RHEUMATREX) 2.5 MG tablet Take 10 mg by mouth once a week. Fridays. Caution:Chemotherapy. Protect from light.    [provider]  prednisoLONE acetate (PRED FORTE) 1 % ophthalmic suspension Place 1 drop into the left eye daily.    [provider]  pregabalin (LYRICA) 25 MG capsule TAKE 1 CAPSULE BY MOUTH TWICE DAILY 11/24/20   Glendale Chard, MD  sildenafil (REVATIO) 20 MG tablet Take one hour prior to sexual activity 09/02/20   Glendale Chard, MD    Allergies    Patient has no known allergies.  Review of Systems   Review of Systems  Constitutional: Negative for diaphoresis.  Respiratory: Negative for shortness of breath.   Cardiovascular: Positive for chest pain.  Gastrointestinal: Negative for nausea and vomiting.  Neurological: Positive for light-headedness. Negative for dizziness and headaches.  All other systems reviewed and are negative.   Physical Exam Updated Vital Signs BP 119/71   Pulse 60   Temp 97.9 F (36.6 C) (Oral)   Resp 13   SpO2 100%   Physical Exam Vitals and nursing note reviewed.  Constitutional:      Appearance: He is not ill-appearing or diaphoretic.  HENT:     Head: Normocephalic and atraumatic.  Eyes:     Extraocular Movements: Extraocular movements intact.     Conjunctiva/sclera: Conjunctivae normal.     Pupils: Pupils are equal, round, and reactive to light.  Cardiovascular:     Rate and Rhythm: Normal rate and regular  rhythm.     Pulses: Normal pulses.  Pulmonary:     Effort: Pulmonary effort is normal.     Breath sounds: Normal breath sounds. No wheezing, rhonchi or rales.  Abdominal:     Palpations: Abdomen is soft.     Tenderness: There is no abdominal tenderness. There is no guarding or rebound.  Musculoskeletal:     Cervical back: Neck supple.  Skin:    General: Skin is warm and dry.  Neurological:     Mental Status: He is alert and oriented to person, place, and time.     ED Results / Procedures / Treatments   Labs (all labs ordered are listed, but only abnormal results are displayed) Labs Reviewed  CBC WITH DIFFERENTIAL/PLATELET -  Abnormal; Notable for the following components:      Result Value   RBC 4.17 (*)    Platelets 106 (*)    All other components within normal limits  BASIC METABOLIC PANEL  URINALYSIS, ROUTINE W REFLEX MICROSCOPIC  TROPONIN I (HIGH SENSITIVITY)  TROPONIN I (HIGH SENSITIVITY)    EKG EKG Interpretation  Date/Time:  Wednesday March 18 2021 12:41:40 EDT Ventricular Rate:  52 PR Interval:  144 QRS Duration: 94 QT Interval:  386 QTC Calculation: 359 R Axis:   86 Text Interpretation: Sinus rhythm Borderline right axis deviation Non-specific ST-t changes `ecg in 2013of similar appearance Confirmed by Lajean Saver [phone]) on 03/18/2021 12:58:04 PM   Radiology DG Chest 2 View  Result Date: 03/18/2021 CLINICAL DATA:  Chest pain, dizziness, near syncopal event, sharp LEFT side chest pain EXAM: CHEST - 2 VIEW COMPARISON:  09/02/2015 FINDINGS: Normal heart size, mediastinal contours, and pulmonary vascularity. Lungs clear. No pulmonary infiltrate, pleural effusion, or pneumothorax. Prior mid cervical fusion. Osseous structures otherwise unremarkable. IMPRESSION: No acute abnormalities. Electronically Signed   By: Lavonia Dana M.D.   On: 03/18/2021 11:04    Procedures Procedures   Medications Ordered in ED Medications  nitroGLYCERIN (NITROSTAT) SL tablet 0.4  mg (has no administration in time range)    ED Course  I have reviewed the triage vital signs and the nursing notes.  Pertinent labs & imaging results that were available during my care of the patient were reviewed by me and considered in my medical decision making (see chart for details).  Clinical Course as of 03/18/21 1617  Wed Mar 18, 2021  1352 Troponin I (High Sensitivity): 4 [MV]    Clinical Course User Index [MV] Eustaquio Maize, [place]   MDM Rules/Calculators/A&P                          65 year old male presents the ED today with complaint of presyncopal episode that occurred 30 minutes prior to having left-sided chest pressure around 8:30 AM today.  History of stenting in the past.  Received 324 mg aspirin and 1 sublingual nitroglycerin with EMS with mild improvement.  On arrival to the ED today patient afebrile, nontachycardic and nontachypneic.  Patient in no acute distress.  EKG with possible right ventricular hypertrophy, no acute changes compared to previous.  Work-up for cardiac etiology at this time with labs.  Given patient still having some chest pain will provide additional nitroglycerin with further evaluation.   Prior to pt receiving additional NTG chest pain resolved.   CXR clear Initial troponin of 4. Will plan to repeat CBC without leukocytosis and hgb stable at 13.9 BMP without electrolyte abnormaltiies Orthostatics negative  Repeat troponin of 5. Pt continues to be chest pain free. Will discharge home at this time with close cardiology follow up. Pt in agreement with plan and stable for discharge.   This note was prepared using Dragon voice recognition software and may include unintentional dictation errors due to the inherent limitations of voice recognition software.  Final Clinical Impression(s) / ED Diagnoses Final diagnoses:  Nonspecific chest pain    Rx / DC Orders ED Discharge Orders    None       Discharge Instructions     Your workup  was reassuring in the ED today. Please follow up with your cardiologist for further evaluation of your ED visit today/episode of chest pain  Return to the ED IMMEDIATELY for any new/worsening symptoms  Eustaquio Maize, PA-C 03/18/21 1617    Lajean Saver, MD 03/20/21 [phone]

## 2021-03-18 NOTE — ED Notes (Signed)
Pt provided a urinal. Pt is aware we need urin. Pt urinated before coming to the room

## 2021-03-18 NOTE — ED Triage Notes (Signed)
Patient complains of awakening with dizziness this am and then had near syncope with cp. Patient alert and oriented, reports cp currently a 4 after receiving baby asa and 1 sl ntg by EMS. Patient in NAD

## 2021-03-18 NOTE — Discharge Instructions (Addendum)
Your workup was reassuring in the ED today. Please follow up with your cardiologist for further evaluation of your ED visit today/episode of chest pain  Return to the ED IMMEDIATELY for any new/worsening symptoms

## 2021-03-18 NOTE — ED Provider Notes (Signed)
Emergency Medicine Provider Triage Evaluation Note  Steven [name] , a 65 y.o. male  was evaluated in triage.  Pt complains of lightheadedness/pre syncope that he noticed this AM upon waking up at 8 AM. 30 minutes later began having left sided chest pressure, nonradiating. States he was diaphoretic when he woke up. No SOB, nausea, vomiting with the chest pain. Received 324 mg ASA and 1 NTG with EMS with mild relief of chest pain however states it is still present about 4/10 currently. Hx of stent in 2007. Nonsmoker. Denies FHx of CAD.  Review of Systems  Positive: + chest pain, lightheadedness Negative: - SOB, nausea, vomiting  Physical Exam  BP 139/77 (BP Location: Right Arm)   Pulse 64   Temp 97.9 F (36.6 C) (Oral)   Resp 18   SpO2 100%  Gen:   Awake, no distress. Legally blind. Resp:  Normal effort. LCTAB. MSK:   Moves extremities without difficulty  Other:  2+ radial pulses bilaterally.   Medical Decision Making  Medically screening exam initiated at 10:27 AM.  Appropriate orders placed.  Steven [name] was informed that the remainder of the evaluation will be completed by another provider, this initial triage assessment does not replace that evaluation, and the importance of remaining in the ED until their evaluation is complete.  Cardiac workup.    Tanda Rockers, PA-C 03/18/21 1028    Cathren Laine, MD 03/20/21 [phone]

## 2021-03-23 ENCOUNTER — Telehealth: Payer: Self-pay

## 2021-03-23 NOTE — Telephone Encounter (Signed)
Transition Care Management Follow-up Telephone Call Date of discharge and from where: 03/18/2021 [facility] How have you been since you were released from the hospital? Still coughing, supposed to see cardiologist but appt was made for august 8th. Any questions or concerns? Yes wants an earlier cardiologist appt.  Items Reviewed: Did the pt receive and understand the discharge instructions provided? Yes  Medications obtained and verified? Yes  Other? No  Any new allergies since your discharge? No  Dietary orders reviewed? Yes none given Do you have support at home? Yes   Home Care and Equipment/Supplies: Were home health services ordered? not applicable No If so, what is the name of the agency?   Has the agency set up a time to come to the patient's home? not applicable Were any new equipment or medical supplies ordered?  No What is the name of the medical supply agency?  Were you able to get the supplies/equipment? N/a Do you have any questions related to the use of the equipment or supplies? No  Functional Questionnaire: (I = Independent and D = Dependent) ADLs: I  Bathing/Dressing- I  Meal Prep- I  Eating- I  Maintaining continence- I  Transferring/Ambulation- I  Managing Meds- I  Follow up appointments reviewed:  PCP Hospital f/u appt confirmed? Yes  Scheduled to see Dr. Allyne Gee on 03/24/2021 @ 10:15 am Specialist Hospital f/u appt confirmed? Yes  Scheduled to see DR. Tresa Endo August 8th on   Are transportation arrangements needed? No  If their condition worsens, is the pt aware to call PCP or go to the Emergency Dept.? Yes Was the patient provided with contact information for the PCP's office or ED? Yes Was to pt encouraged to call back with questions or concerns? Yes

## 2021-03-24 ENCOUNTER — Other Ambulatory Visit: Payer: Self-pay | Admitting: Internal Medicine

## 2021-03-24 ENCOUNTER — Other Ambulatory Visit: Payer: Self-pay

## 2021-03-24 ENCOUNTER — Encounter: Payer: Self-pay | Admitting: Internal Medicine

## 2021-03-24 ENCOUNTER — Ambulatory Visit (INDEPENDENT_AMBULATORY_CARE_PROVIDER_SITE_OTHER): Payer: Medicare Other | Admitting: Internal Medicine

## 2021-03-24 VITALS — BP 134/80 | HR 65 | Temp 98.3°F | Ht 66.2 in | Wt 156.2 lb

## 2021-03-24 DIAGNOSIS — R61 Generalized hyperhidrosis: Secondary | ICD-10-CM | POA: Diagnosis not present

## 2021-03-24 DIAGNOSIS — R634 Abnormal weight loss: Secondary | ICD-10-CM | POA: Diagnosis not present

## 2021-03-24 DIAGNOSIS — I25119 Atherosclerotic heart disease of native coronary artery with unspecified angina pectoris: Secondary | ICD-10-CM

## 2021-03-24 DIAGNOSIS — M6281 Muscle weakness (generalized): Secondary | ICD-10-CM

## 2021-03-24 NOTE — Patient Instructions (Signed)
Angina Angina is discomfort or pain in the chest, neck, arm, jaw, or back. The discomfort is caused by a lack of blood in the middle layer of the heart wall. The middle layer of the heart wall is called the myocardium. What are the causes? This condition is caused by a buildup of fat and cholesterol, or plaque, in your arteries. This buildup narrows the arteries and makes it hard for blood to flow. What increases the risk? Main risks High levels of cholesterol in your blood. High blood pressure. Diabetes. Family history of heart disease. Not exercising or moving enough. Depression. Having had radiation treatment on the left side of your chest. Other risks Tobacco use. Too much body weight (obesity). A diet that is high in unhealthy fats (saturated fats). Stress. Using drugs, such as cocaine. Risks for women Being older than 55 years. Being in menopause. This is the time when a woman no longer has a menstrual period. What are the signs or symptoms? Symptoms in all people Chest pain, which may: Feel like a crushing or squeezing in the chest. Feel like a tightness, pressure, fullness, or heaviness in the chest. Last for more than a few minutes at a time. Stop and come back (recur) after a few minutes. Pain in the neck, arm, jaw, or back. Heartburn or upset stomach (indigestion) for no reason. Being short of breath. Feeling like you may vomit (nauseous). Sudden cold sweats. Symptoms in women and people with diabetes Tiredness. Worry and anxiety. Weakness. Dizziness or fainting. How is this treated? This condition may be treated with: Medicines. These can be given to prevent blood clots, stop a heart attack, lower blood pressure, or treat other risk factors. A procedure to widen a narrowed or blocked artery in the heart. Surgery to allow blood to go around a blocked artery. Follow these instructions at home: Medicines Take over-the-counter and prescription medicines only as  told by your doctor. Do not take these medicines unless your doctor says that you can: NSAIDs. These include: Ibuprofen. Naproxen. Vitamin supplements that have vitamin A, vitamin E, or both. Hormone therapy that contains estrogen with or without progestin. Eating and drinking  Eat a heart-healthy diet that includes: Lots of fresh fruits and vegetables. Whole grains. Low-fat (lean) protein. Low-fat dairy products. Follow instructions from your doctor about what you cannot eat or drink. Activity Follow an exercise program that your doctor tells you. Talk with your doctor about joining a program to make your heart strong again (cardiac rehab). When you feel tired, take a break. Plan breaks if you know you are going to feel tired. Lifestyle  Do not smoke or use any products that contain nicotine or tobacco. If you need help quitting, ask your doctor. If your doctor says you can drink alcohol: Limit how much you have to: 0-1 drink a day for women who are not pregnant. 0-2 drinks a day for men. Know how much alcohol is in your drink. In the [place] one drink equals one 12 oz bottle of beer (355 mL), one 5 oz glass of wine (148 mL), or one 1 oz glass of hard liquor (44 mL). General instructions Stay at a healthy weight. If told to lose weight, work with your doctor to do lose weight safely. Learn to manage stress. If you need help, ask your doctor. Keep your vaccines up to date. Get a flu shot every year. Talk with your doctor if you feel sad. Take a screening test to see if you are   at risk for depression. Work with your doctor to manage any other health problems that you have. These may include diabetes or high blood pressure. Keep all follow-up visits. Get help right away if: You have pain in your chest, neck, arm, jaw, or back, and the pain: Lasts more than a few minutes. Comes back. Does not get better after you take medicine under your tongue (sublingual nitroglycerin). Keeps  getting worse. Comes more often. You have any of these problems: Sweating a lot. Heartburn or upset stomach. Shortness of breath. Trouble breathing. Feeling like you may vomit. Vomiting. Feeling more tired than normal. Feeling nervous or worrying more than normal. Weakness. You are dizzy or light-headed all of a sudden. You faint. These symptoms may be an emergency. Get help right away. Call your local emergency services (911 in the [place] Do not wait to see if the symptoms will go away. Do not drive yourself to the hospital. Summary Angina is discomfort or pain in the chest, neck, arm, neck, or back. Symptoms include chest pain, heartburn or upset stomach, and shortness of breath. Women or people with diabetes may have other symptoms, such as feeling nervous, being worried, or being weak or tired. Take all medicines only as told by your doctor. You should eat a heart-healthy diet and follow an exercise program. This information is not intended to replace advice given to you by your health care provider. Make sure you discuss any questions you have with your health care provider. Document Revised: 03/21/2020 Document Reviewed: 03/21/2020 Elsevier Patient Education  2022 Elsevier Inc.  

## 2021-03-24 NOTE — Progress Notes (Signed)
I,Katawbba Wiggins,acting as a Neurosurgeon for Gwynneth Aliment, MD.,have documented all relevant documentation on the behalf of Gwynneth Aliment, MD,as directed by  Gwynneth Aliment, MD while in the presence of Gwynneth Aliment, MD.  This visit occurred during the SARS-CoV-2 public health emergency.  Safety protocols were in place, including screening questions prior to the visit, additional usage of staff PPE, and extensive cleaning of exam room while observing appropriate contact time as indicated for disinfecting solutions.  Subjective:     Patient ID: Steven [name] , male    DOB: [birth year] , 65 y.o.   MRN: [id number]   Chief Complaint  Patient presents with   ER follow-up    HPI  The patient is here today for a ER follow-up for chest pain. He was seen at [facility] ER on 03/18/21/  He presented to [facility] ER on 6/8 for further evaluation of cp. 65 year old male presents the ED with complaint of presyncopal episode that occurred 30 minutes prior to having left-sided chest pressure around 8:30 AM the day of presentation.  History of stenting in the past.  Received 324 mg aspirin and 1 sublingual nitroglycerin with EMS with mild improvement.  On arrival to the ED, patient was afebrile, nontachycardic and nontachypneic.  Patient in no acute distress.  EKG with possible right ventricular hypertrophy, no acute changes compared to previous.    Workup negative for acute cardiac event and pt was advised to f/u with Cardiology. He has upcoming appt. He has not had any recurrence of his sx since ER evaluation.     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 eye  with blurred vision 12/18/2011   HTN (hypertension) 12/18/2011   Hypertension      Family History  Problem Relation Age of Onset   Heart attack Mother    Diabetes Mother    Hypertension Father      Current Outpatient Medications:    aspirin EC 81 MG tablet, Take 81 mg by mouth daily., Disp: , Rfl:    cycloSPORINE (SANDIMMUNE) 25 MG capsule, Take 75 mg by mouth 3 (three) times daily., Disp: , Rfl:    dorzolamide-timolol (COSOPT) 22.3-6.8 MG/ML ophthalmic solution, Place 1 drop into the left eye 2 (two) times daily., Disp: , Rfl:    folic acid (FOLVITE) 1 MG tablet, Take 1 mg by mouth daily. 4 tabs daily, Disp: , Rfl:    latanoprost (XALATAN) 0.005 % ophthalmic solution, PLACE 1 GTT INTO THE LEFT EYE NIGHTLY, Disp: , Rfl:    LINZESS 290 MCG CAPS capsule, Take 290 mcg by mouth daily., Disp: , Rfl:    lisinopril (ZESTRIL) 10 MG tablet, TAKE 1 TABLET BY MOUTH EVERY DAY (Patient taking differently: Take 10 mg by mouth daily.), Disp: 90 tablet, Rfl: 1   methotrexate (RHEUMATREX) 2.5 MG tablet, Take 10 mg by mouth once a week. Fridays. Caution:Chemotherapy. Protect from light., Disp: , Rfl:    prednisoLONE acetate (PRED FORTE) 1 % ophthalmic suspension, Place 1 drop into the left eye daily., Disp: , Rfl:    pregabalin (LYRICA) 25 MG capsule, TAKE 1 CAPSULE BY  MOUTH TWICE DAILY (Patient taking differently: Take 25 mg by mouth 2 (two) times daily.), Disp: 180 capsule, Rfl: 1   sildenafil (REVATIO) 20 MG tablet, Take one hour prior to sexual activity (Patient taking differently: Take 20 mg by mouth as needed (ED). Take one hour prior to sexual activity), Disp: 10 tablet, Rfl: 0   atorvastatin (LIPITOR) 40 MG tablet, Take 1 tablet (40 mg total) by mouth daily., Disp: 90 tablet, Rfl: 3   No Known Allergies   Men's preventive visit. Patient Health Questionnaire (PHQ-2) is  Flowsheet Row Clinical Support from 01/22/2021 in [facility]  PHQ-2 Total Score 0     . Patient is on a  heart healthy diet. Marital status: Divorced. Relevant history for alcohol use is:  Social History   Substance and Sexual Activity  Alcohol Use No   Alcohol/week: 0.0 standard drinks  . Relevant history for tobacco use is:  Social History   Tobacco Use  Smoking Status Former   Packs/day: 0.75   Years: 20.00   Pack years: 15.00   Types: Cigarettes   Start date: 10/11/1981   Quit date: 10/11/2001   Years since quitting: 19.5  Smokeless Tobacco Never  .   Review of Systems  Constitutional:        He c/o weight loss. States he has lost a lot of weight in last six months. He is unsure of the amount.   Respiratory: Negative.    Gastrointestinal: Negative.   Musculoskeletal:  Positive for myalgias.       He c/o of muscle weakness in LE. Denies fall/trauma. States he does not feel as strong. He has been walking on a regular basis, but doesn't "feel the same". Denies family history of autoimmune disease.    Psychiatric/Behavioral: Negative.    All other systems reviewed and are negative.   Today's Vitals   03/24/21 0947  BP: 134/80  Pulse: 65  Temp: 98.3 F (36.8 C)  TempSrc: Oral  Weight: 156 lb 3.2 oz (70.9 kg)  Height: 5' 6.2" (1.681 m)  PainSc: 2   PainLoc: Chest   Body mass index is 25.06 kg/m.    Objective:  Physical Exam Vitals and nursing note reviewed.  Constitutional:      Appearance: Normal appearance.  HENT:     Head: Normocephalic and atraumatic.     Nose:     Comments: Masked     Mouth/Throat:     Comments: Masked  Cardiovascular:     Rate and Rhythm: Normal rate and regular rhythm.     Heart sounds: Normal heart sounds.  Pulmonary:     Effort: Pulmonary effort is normal.     Breath sounds: Normal breath sounds.  Musculoskeletal:     Cervical back: Normal range of motion.  Skin:    General: Skin is warm.  Neurological:     General: No focal deficit present.     Mental Status: He is alert.  Psychiatric:        Mood and Affect: Mood normal.         Assessment And Plan:    1. Chest pain due to coronary artery disease [facility]) Comments: ER records reviewed in full detail. Encouraged to f/u with Cardiology as scheduled. He agrees to go to ER should his sx recur.   2. Unexplained night sweats Comments: He is advised to check blood sugars at bedtime and upon arising. I will also place [place] sensor so he can track his sugars as  well.   3. Weight loss, unintentional Comments: He admits his appetite has changed, pt advised this could have resulted in his weight loss. Pt advised he has gained 1lb. since Nov 2021. ER labs reviewed.   4. Muscle weakness of lower extremity Comments: I will check labs as listed below. If ANA positive, he agrees to Rheum eval if needed.  - ANA, IFA (with reflex) - CK, total - Magnesium  Patient was given opportunity to ask questions. Patient verbalized understanding of the plan and was able to repeat key elements of the plan. All questions were answered to their satisfaction.   I, Gwynneth Aliment, MD, have reviewed all documentation for this visit. The documentation on 03/24/21 for the exam, diagnosis, procedures, and orders are all accurate and complete.  THE PATIENT IS ENCOURAGED TO PRACTICE SOCIAL DISTANCING DUE TO THE COVID-19 PANDEMIC.

## 2021-03-31 ENCOUNTER — Ambulatory Visit (INDEPENDENT_AMBULATORY_CARE_PROVIDER_SITE_OTHER): Payer: Medicare Other | Admitting: Cardiology

## 2021-03-31 ENCOUNTER — Other Ambulatory Visit: Payer: Self-pay

## 2021-03-31 VITALS — BP 122/74 | HR 59 | Ht 65.0 in | Wt 156.0 lb

## 2021-03-31 DIAGNOSIS — R079 Chest pain, unspecified: Secondary | ICD-10-CM | POA: Diagnosis not present

## 2021-03-31 DIAGNOSIS — I10 Essential (primary) hypertension: Secondary | ICD-10-CM | POA: Diagnosis not present

## 2021-03-31 DIAGNOSIS — R634 Abnormal weight loss: Secondary | ICD-10-CM | POA: Insufficient documentation

## 2021-03-31 DIAGNOSIS — K625 Hemorrhage of anus and rectum: Secondary | ICD-10-CM

## 2021-03-31 DIAGNOSIS — I2510 Atherosclerotic heart disease of native coronary artery without angina pectoris: Secondary | ICD-10-CM | POA: Diagnosis not present

## 2021-03-31 DIAGNOSIS — K649 Unspecified hemorrhoids: Secondary | ICD-10-CM | POA: Insufficient documentation

## 2021-03-31 DIAGNOSIS — Z7409 Other reduced mobility: Secondary | ICD-10-CM | POA: Insufficient documentation

## 2021-03-31 DIAGNOSIS — R232 Flushing: Secondary | ICD-10-CM | POA: Insufficient documentation

## 2021-03-31 DIAGNOSIS — E1140 Type 2 diabetes mellitus with diabetic neuropathy, unspecified: Secondary | ICD-10-CM | POA: Insufficient documentation

## 2021-03-31 DIAGNOSIS — Z0000 Encounter for general adult medical examination without abnormal findings: Secondary | ICD-10-CM | POA: Insufficient documentation

## 2021-03-31 DIAGNOSIS — K641 Second degree hemorrhoids: Secondary | ICD-10-CM | POA: Insufficient documentation

## 2021-03-31 DIAGNOSIS — Z79899 Other long term (current) drug therapy: Secondary | ICD-10-CM | POA: Insufficient documentation

## 2021-03-31 DIAGNOSIS — E785 Hyperlipidemia, unspecified: Secondary | ICD-10-CM

## 2021-03-31 HISTORY — DX: Hemorrhage of anus and rectum: K62.5

## 2021-03-31 LAB — MAGNESIUM: Magnesium: 2.1 mg/dL (ref 1.6–2.3)

## 2021-03-31 LAB — ANTINUCLEAR ANTIBODIES, IFA: ANA Titer 1: NEGATIVE

## 2021-03-31 LAB — CK: Total CK: 158 U/L (ref 41–331)

## 2021-03-31 MED ORDER — ATORVASTATIN CALCIUM 40 MG PO TABS: 40.0000 mg | ORAL_TABLET | Freq: Every day | ORAL | 3 refills | Status: DC

## 2021-03-31 NOTE — Progress Notes (Signed)
kg

## 2021-03-31 NOTE — Patient Instructions (Signed)
Medication Instructions:  Your physician has recommended you make the following change in your medication:  INCREASE LIPITOR TO 40MG  DAILY  *If you need a refill on your cardiac medications before your next appointment, please call your pharmacy*   Lab Work: NONE If you have labs (blood work) drawn today and your tests are completely normal, you will receive your results only by: MyChart Message (if you have MyChart) OR A paper copy in the mail If you have any lab test that is abnormal or we need to change your treatment, we will call you to review the results.   Testing/Procedures:   [facility] Cardiovascular Imaging at [facility] [address] [place], [place] Phone: [phone]    Please arrive 15 minutes prior to your appointment time for registration and insurance purposes.  The test will take approximately 3 to 4 hours to complete; you may bring reading material.  If someone comes with you to your appointment, they will need to remain in the main lobby due to limited space in the testing area. How to prepare for your Myocardial Perfusion Test: Do not eat or drink 3 hours prior to your test, except you may have water. Do not consume products containing caffeine (regular or decaffeinated) 12 hours prior to your test. (ex: coffee, chocolate, sodas, tea).  HOLD Erectile dysfunction medication: , Do wear comfortable clothes (no dresses or overalls) and walking shoes, tennis shoes preferred (No heels or open toe shoes are allowed). Do NOT wear cologne, perfume, aftershave, or lotions (deodorant is allowed). If these instructions are not followed, your test will have to be rescheduled.  Please report to [address], [address] for your test.  If you have questions or concerns about your appointment, you can call the Nuclear Lab at [phone].  If you cannot keep your appointment, please provide 24 hours notification to the Nuclear Lab, to  avoid a possible $50 charge to your account.    Follow-Up: At [facility], you and your health needs are our priority.  As part of our continuing mission to provide you with exceptional heart care, we have created designated Provider Care Teams.  These Care Teams include your primary Cardiologist (physician) and Advanced Practice Providers (APPs -  Physician Assistants and Nurse Practitioners) who all work together to provide you with the care you need, when you need it.  We recommend signing up for the patient portal called "MyChart".  Sign up information is provided on this After Visit Summary.  MyChart is used to connect with patients for Virtual Visits (Telemedicine).  Patients are able to view lab/test results, encounter notes, upcoming appointments, etc.  Non-urgent messages can be sent to your provider as well.   To learn more about what you can do with MyChart, go to [facility].    Your next appointment:   3 month(s)  The format for your next appointment:   In Person  Provider:   [email or web address], MD   Other Instructions

## 2021-03-31 NOTE — Progress Notes (Signed)
I had a chest pain and I was in the emergency room  Cardiology Consultation:    Date:  03/31/2021   ID:  Steven [name], DOB August 28, 1956, MRN [id number]  PCP:  Dorothyann Peng, MD  Cardiologist:  Gypsy Balsam, MD   Referring MD: Dorothyann Peng, MD   Chief Complaint  Patient presents with   Chest Pain    Leg weakness and night sweats x1 week  Had a chest pain and I was in the emergency room  History of Present Illness:    Steven [name] is a 65 y.o. male who is being seen today for the evaluation of chest pain and coronary artery disease at the request of Dorothyann Peng, MD. with past medical history significant for coronary artery disease.  In 2007 he did have a cardiac catheterization required stent to left anterior descending artery.  Last cardiac catheterization was done in 2010 showing mild diffuse in-stent restenosis, nuclear stress test done in 2014 was normal.  He also got history of hypertension, dyslipidemia, diabetes, he is legally blind to after coronary artery transplant.  Recently he ended up coming to the hospital.  He developed stabbing chest pain lasted for about 1 hour he grades his sensation of 7 9 scale up to 10.  There was some sweating associated with this sensation but no shortness of breath that pain happen at rest.  I did review ER visit for that sensation.  Troponin I's were normal he was discharged home with instruction to follow-up with [place].  Since that time he denies have any chest pain tightness squeezing pressure burning chest.  He still very active he walks a lot in spite of his legal blindness. 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 eye with blurred vision 12/18/2011   HTN (hypertension) 12/18/2011   Hypertension     Past Surgical History:  Procedure Laterality Date   CARDIAC SURGERY     stent placed in 2007   CORNEAL TRANSPLANT     CORONARY ANGIOPLASTY  2008   STENT PLACED IN LAD.MILD DIFFUSE IN-STENT RESTENOSIS IN 2010.   CORONARY STENT PLACEMENT     MYOCARDIAL PERFUSION STUDY  01/19/13   NORMAL STRESS NUCLEAR STUDY.   TRANSTHORACIC ECHOCARDIOGRAM  07/28/2009   LV= MILD LVH. EF 60% TO 65%.   VASCULAR US  01/15/09   NORMAL LOWER ARTERIAL DOPPLER    Current Medications: Current Meds  Medication Sig   aspirin EC 81 MG tablet Take 81 mg by mouth daily.   atorvastatin (LIPITOR) 20 MG tablet TAKE 1 TABLET BY MOUTH MONDAY- FRIDAY (Patient taking differently: Take 20 mg by mouth daily. TAKE 1 TABLET BY MOUTH MONDAY- FRIDAY)   cycloSPORINE (SANDIMMUNE) 25 MG capsule Take 75 mg by mouth 3 (three) times daily.   dorzolamide-timolol (COSOPT) 22.3-6.8 MG/ML ophthalmic solution Place 1 drop into the left eye 2 (two) times daily.   folic acid (FOLVITE) 1 MG tablet Take 1 mg by mouth daily. 4 tabs daily  latanoprost (XALATAN) 0.005 % ophthalmic solution PLACE 1 GTT INTO THE LEFT EYE NIGHTLY   LINZESS 290 MCG CAPS capsule Take 290 mcg by mouth daily.   lisinopril (ZESTRIL) 10 MG tablet TAKE 1 TABLET BY MOUTH EVERY DAY (Patient taking differently: Take 10 mg by mouth daily.)   methotrexate (RHEUMATREX) 2.5 MG tablet Take 10 mg by mouth once a week. Fridays. Caution:Chemotherapy. Protect from light.   prednisoLONE acetate (PRED FORTE) 1 % ophthalmic suspension Place 1 drop into the left eye daily.   pregabalin (LYRICA) 25 MG capsule TAKE 1 CAPSULE BY MOUTH TWICE DAILY (Patient taking differently: Take 25 mg by mouth 2 (two) times daily.)   sildenafil (REVATIO) 20 MG tablet Take one hour prior to  sexual activity (Patient taking differently: Take 20 mg by mouth as needed (ED). Take one hour prior to sexual activity)     Allergies:   Patient has no known allergies.   Social History   Socioeconomic History   Marital status: Divorced    Spouse name: Not on file   Number of children: Not on file   Years of education: Not on file   Highest education level: Not on file  Occupational History   Occupation: disability  Tobacco Use   Smoking status: Former    Packs/day: 0.75    Years: 20.00    Pack years: 15.00    Types: Cigarettes    Start date: 10/11/1981    Quit date: 10/11/2001    Years since quitting: 19.4   Smokeless tobacco: Never  Vaping Use   Vaping Use: Never used  Substance and Sexual Activity   Alcohol use: No    Alcohol/week: 0.0 standard drinks   Drug use: Not Currently   Sexual activity: Yes  Other Topics Concern   Not on file  Social History Narrative   Not on file   Social Determinants of Health   Financial Resource Strain: Low Risk    Difficulty of Paying Living Expenses: Not hard at all  Food Insecurity: No Food Insecurity   Worried About Programme researcher, broadcasting/film/video in the Last Year: Never true   Ran Out of Food in the Last Year: Never true  Transportation Needs: No Transportation Needs   Lack of Transportation (Medical): No   Lack of Transportation (Non-Medical): No  Physical Activity: Sufficiently Active   Days of Exercise per Week: 7 days   Minutes of Exercise per Session: 60 min  Stress: No Stress Concern Present   Feeling of Stress : Not at all  Social Connections: Not on file     Family History: The patient's I did review lab work test from the hospital.  Those were negative for myocardial injury.  family history includes Diabetes in his mother; Heart attack in his mother; Hypertension in his father. ROS:   Please see the history of present illness.    All 14 point review of systems negative except as described per history of present  illness.  EKGs/Labs/Other Studies Reviewed:    The following studies were reviewed today: I did review lab work test from hospitalization.  All indicated to lack of myocardial injury  EKG:  EKG is ordered today.  The ekg ordered today demonstrates normal sinus rhythm, sinus arrhythmia, early repolarization abnormalities.  Recent Labs: 03/02/2021: ALT 23; TSH 1.090 03/18/2021: BUN 15; Creatinine, Ser 1.03; Hemoglobin 13.9; Platelets 106; Potassium 4.5; Sodium 136  Recent Lipid Panel    Component Value Date/Time   CHOL 194 03/02/2021 1108  TRIG 60 03/02/2021 1108   HDL 75 03/02/2021 1108   CHOLHDL 2.6 03/02/2021 1108   CHOLHDL 2.6 12/19/2011 0138   VLDL 12 12/19/2011 0138   LDLCALC 108 (H) 03/02/2021 1108    Physical Exam:    VS:  BP 122/74 (BP Location: Right Arm, Patient Position: Sitting)   Pulse (!) 59   Ht 5\' 5"  (1.651 m)   Wt 156 lb (70.8 kg)   SpO2 99%   BMI 25.96 kg/m     Wt Readings from Last 3 Encounters:  03/31/21 156 lb (70.8 kg)  03/24/21 156 lb 3.2 oz (70.9 kg)  03/02/21 162 lb 3.2 oz (73.6 kg)     GEN:  Well nourished, well developed in no acute distress HEENT: Normal NECK: No JVD; No carotid bruits LYMPHATICS: No lymphadenopathy CARDIAC: RRR, no murmurs, no rubs, no gallops RESPIRATORY:  Clear to auscultation without rales, wheezing or rhonchi  ABDOMEN: Soft, non-tender, non-distended MUSCULOSKELETAL:  No edema; No deformity  SKIN: Warm and dry NEUROLOGIC:  Alert and oriented x 3 PSYCHIATRIC:  Normal affect   ASSESSMENT:    1. Coronary artery disease involving native coronary artery of native heart without angina pectoris   2. Primary hypertension   3. Type 2 diabetes mellitus with diabetic neuropathy, without long-term current use of insulin (HCC)   4. Dyslipidemia    PLAN:    In order of problems listed above:  Coronary disease status post PTCA and stenting of LAD in 2007 with last evaluation 2014.  He presented with chest pain to the  hospital and he ruled out.  I will schedule him to have Lexiscan to rule out any reactivation of coronary artery disease.  In the meantime he will continue with antiplatelet therapy as well as statin.  He does have nitroglycerin and knows how to use it essential hypertension blood pressure well controlled continue present management. Type 2 diabetes controlled with diet alone.  His hemoglobin A1c I did review K PN which show me 6.2 which is decent control. Dyslipidemia I did review K PN which show me his LDL of 108 HDL 75.  I will increase dose of Lipitor from 20-40 in the future we will do his fasting lipid profile.   Medication Adjustments/Labs and Tests Ordered: Current medicines are reviewed at length with the patient today.  Concerns regarding medicines are outlined above.  No orders of the defined types were placed in this encounter.  No orders of the defined types were placed in this encounter.   Signed, 2015, MD, [facility]. 03/31/2021 10:24 AM    [facility]

## 2021-04-02 ENCOUNTER — Telehealth (HOSPITAL_COMMUNITY): Payer: Self-pay | Admitting: *Deleted

## 2021-04-02 NOTE — Telephone Encounter (Signed)
Patient given detailed instructions per Myocardial Perfusion Study Information Sheet for the test on 04/08/21 Patient notified to arrive 15 minutes early and that it is imperative to arrive on time for appointment to keep from having the test rescheduled.  If you need to cancel or reschedule your appointment, please call the office within 24 hours of your appointment. . Patient verbalized understanding. Daielle Melcher Jacqueline   

## 2021-04-08 ENCOUNTER — Ambulatory Visit (HOSPITAL_COMMUNITY): Payer: Medicare Other | Attending: Cardiovascular Disease

## 2021-04-08 ENCOUNTER — Other Ambulatory Visit: Payer: Self-pay

## 2021-04-08 DIAGNOSIS — R079 Chest pain, unspecified: Secondary | ICD-10-CM

## 2021-04-08 LAB — MYOCARDIAL PERFUSION IMAGING
LV dias vol: 104 mL (ref 62–150)
LV sys vol: 54 mL
Peak HR: 101 {beats}/min
Rest HR: 53 {beats}/min
SDS: 1
SRS: 0
SSS: 1
TID: 1.01

## 2021-04-08 MED ORDER — TECHNETIUM TC 99M TETROFOSMIN IV KIT
10.7000 | PACK | Freq: Once | INTRAVENOUS | Status: AC | PRN
  Administered 2021-04-08: 10.7 via INTRAVENOUS
  Filled 2021-04-08: qty 11

## 2021-04-08 MED ORDER — REGADENOSON 0.4 MG/5ML IV SOLN
0.4000 mg | Freq: Once | INTRAVENOUS | Status: AC
Start: 2021-04-08 — End: 2021-04-08
  Administered 2021-04-08: 0.4 mg via INTRAVENOUS

## 2021-04-08 MED ORDER — TECHNETIUM TC 99M TETROFOSMIN IV KIT
32.6000 | PACK | Freq: Once | INTRAVENOUS | Status: AC | PRN
  Administered 2021-04-08: 32.6 via INTRAVENOUS
  Filled 2021-04-08: qty 33

## 2021-04-15 ENCOUNTER — Telehealth: Payer: Self-pay | Admitting: Cardiovascular Disease

## 2021-04-15 ENCOUNTER — Telehealth: Payer: Self-pay

## 2021-04-15 DIAGNOSIS — H30033 Focal chorioretinal inflammation, peripheral, bilateral: Secondary | ICD-10-CM | POA: Diagnosis not present

## 2021-04-15 DIAGNOSIS — Z79899 Other long term (current) drug therapy: Secondary | ICD-10-CM | POA: Diagnosis not present

## 2021-04-15 NOTE — Telephone Encounter (Signed)
-----   Message from Georgeanna Lea, MD sent at 04/14/2021  4:52 PM EDT ----- Stress test showed no evidence of ischemia

## 2021-04-15 NOTE — Telephone Encounter (Signed)
Left message on patients voicemail to please return our call.   

## 2021-04-15 NOTE — Telephone Encounter (Signed)
Follow up:     Patient returning a call concering some results. 

## 2021-04-15 NOTE — Telephone Encounter (Signed)
Pt is returning a call  

## 2021-04-15 NOTE — Telephone Encounter (Signed)
Spoke with patient regarding results and recommendation.  Patient verbalizes understanding and is agreeable to plan of care. Advised patient to call back with any issues or concerns.  

## 2021-04-17 ENCOUNTER — Telehealth: Payer: Self-pay

## 2021-04-17 NOTE — Telephone Encounter (Signed)
-----   Message from Georgeanna Lea, MD sent at 04/17/2021 12:49 PM EDT ----- Stress test is negative

## 2021-04-17 NOTE — Telephone Encounter (Signed)
Spoke with patient regarding results and recommendation.  Patient verbalizes understanding and is agreeable to plan of care. Advised patient to call back with any issues or concerns.  

## 2021-04-21 ENCOUNTER — Ambulatory Visit: Payer: Medicare Other | Admitting: Nurse Practitioner

## 2021-04-21 DIAGNOSIS — Z947 Corneal transplant status: Secondary | ICD-10-CM | POA: Diagnosis not present

## 2021-04-21 DIAGNOSIS — H4042X2 Glaucoma secondary to eye inflammation, left eye, moderate stage: Secondary | ICD-10-CM | POA: Diagnosis not present

## 2021-04-21 DIAGNOSIS — H402224 Chronic angle-closure glaucoma, left eye, indeterminate stage: Secondary | ICD-10-CM | POA: Diagnosis not present

## 2021-04-21 DIAGNOSIS — H3581 Retinal edema: Secondary | ICD-10-CM | POA: Diagnosis not present

## 2021-04-21 DIAGNOSIS — H44521 Atrophy of globe, right eye: Secondary | ICD-10-CM | POA: Diagnosis not present

## 2021-04-21 DIAGNOSIS — H209 Unspecified iridocyclitis: Secondary | ICD-10-CM | POA: Diagnosis not present

## 2021-04-21 DIAGNOSIS — Z79899 Other long term (current) drug therapy: Secondary | ICD-10-CM | POA: Diagnosis not present

## 2021-04-21 DIAGNOSIS — H30033 Focal chorioretinal inflammation, peripheral, bilateral: Secondary | ICD-10-CM | POA: Diagnosis not present

## 2021-04-21 DIAGNOSIS — H35033 Hypertensive retinopathy, bilateral: Secondary | ICD-10-CM | POA: Diagnosis not present

## 2021-04-30 ENCOUNTER — Ambulatory Visit (INDEPENDENT_AMBULATORY_CARE_PROVIDER_SITE_OTHER): Payer: Medicare Other | Admitting: Nurse Practitioner

## 2021-04-30 ENCOUNTER — Other Ambulatory Visit: Payer: Self-pay

## 2021-04-30 ENCOUNTER — Encounter: Payer: Self-pay | Admitting: Nurse Practitioner

## 2021-04-30 VITALS — BP 114/78 | HR 78 | Temp 98.7°F | Ht 65.0 in | Wt 162.2 lb

## 2021-04-30 DIAGNOSIS — R61 Generalized hyperhidrosis: Secondary | ICD-10-CM | POA: Diagnosis not present

## 2021-04-30 DIAGNOSIS — R42 Dizziness and giddiness: Secondary | ICD-10-CM | POA: Diagnosis not present

## 2021-04-30 DIAGNOSIS — R519 Headache, unspecified: Secondary | ICD-10-CM | POA: Diagnosis not present

## 2021-04-30 LAB — CBC WITH DIFFERENTIAL/PLATELET
Basophils Absolute: 0 10*3/uL (ref 0.0–0.2)
Basos: 1 %
EOS (ABSOLUTE): 0 10*3/uL (ref 0.0–0.4)
Eos: 1 %
Hematocrit: 38.2 % (ref 37.5–51.0)
Hemoglobin: 13.1 g/dL (ref 13.0–17.7)
Immature Grans (Abs): 0 10*3/uL (ref 0.0–0.1)
Immature Granulocytes: 0 %
Lymphocytes Absolute: 1.3 10*3/uL (ref 0.7–3.1)
Lymphs: 32 %
MCH: 32.3 pg (ref 26.6–33.0)
MCHC: 34.3 g/dL (ref 31.5–35.7)
MCV: 94 fL (ref 79–97)
Monocytes Absolute: 0.4 10*3/uL (ref 0.1–0.9)
Monocytes: 11 %
Neutrophils Absolute: 2.2 10*3/uL (ref 1.4–7.0)
Neutrophils: 55 %
Platelets: 176 10*3/uL (ref 150–450)
RBC: 4.06 x10E6/uL — ABNORMAL LOW (ref 4.14–5.80)
RDW: 13.2 % (ref 11.6–15.4)
WBC: 3.9 10*3/uL (ref 3.4–10.8)

## 2021-04-30 NOTE — Progress Notes (Signed)
I,Katawbba Wiggins,acting as a Neurosurgeon for [employer], NP.,have documented all relevant documentation on the behalf of [employer], NP,as directed by  Charlesetta Ivory, NP while in the presence of Charlesetta Ivory, NP.  This visit occurred during the SARS-CoV-2 public health emergency.  Safety protocols were in place, including screening questions prior to the visit, additional usage of staff PPE, and extensive cleaning of exam room while observing appropriate contact time as indicated for disinfecting solutions.  Subjective:     Patient ID: Steven [name] , male    DOB: June 29, 1956 , 65 y.o.   MRN: [id number]   Chief Complaint  Patient presents with   muscle weakness   Dizziness   Night Sweats    HPI  The patient is here today for an evaluation of muscle weakness, night sweats, dizziness that he has been having since his last visit for a hospital follow-up.  The patient said he was evaluated by the cardiologist and they didn't find anything wrong.  The patient states he doesn't think that it's his heart but something else.  The patient states that he hopes he hasn't had a stroke because he has slurred speech sometime. He denies chest pain at this visit but does complain of dizziness at times. Patient is legally blind.  Wt Readings from Last 3 Encounters: 04/30/21 : 162 lb 3.2 oz (73.6 kg) 04/08/21 : 156 lb (70.8 kg) 03/31/21 : 156 lb (70.8 kg)      Past Medical History:  Diagnosis Date   Blindness, legal 12/18/2011   CAD (coronary artery disease), known LAD stent placed in 2007 in [place] 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 eye with blurred vision 12/18/2011   HTN (hypertension) 12/18/2011   Hypertension      Family History  Problem Relation Age of  Onset   Heart attack Mother    Diabetes Mother    Hypertension Father      Current Outpatient Medications:    aspirin EC 81 MG tablet, Take 81 mg by mouth daily., Disp: , Rfl:    atorvastatin (LIPITOR) 40 MG tablet, Take 1 tablet (40 mg total) by mouth daily., Disp: 90 tablet, Rfl: 3   cycloSPORINE (SANDIMMUNE) 25 MG capsule, Take 75 mg by mouth 3 (three) times daily., Disp: , Rfl:    dorzolamide-timolol (COSOPT) 22.3-6.8 MG/ML ophthalmic solution, Place 1 drop into the left eye 2 (two) times daily., Disp: , Rfl:    folic acid (FOLVITE) 1 MG tablet, Take 1 mg by mouth daily. 4 tabs daily, Disp: , Rfl:    latanoprost (XALATAN) 0.005 % ophthalmic solution, PLACE 1 GTT INTO THE LEFT EYE NIGHTLY, Disp: , Rfl:    LINZESS 290 MCG CAPS capsule, Take 290 mcg by mouth daily., Disp: , Rfl:    lisinopril (ZESTRIL) 10 MG tablet, TAKE 1 TABLET BY MOUTH EVERY DAY (Patient taking differently: Take 10 mg by mouth daily.), Disp: 90 tablet, Rfl: 1   methotrexate (RHEUMATREX) 2.5 MG tablet, Take 10 mg by mouth once a week. Fridays. Caution:Chemotherapy. Protect from light., Disp: , Rfl:    prednisoLONE acetate (PRED FORTE) 1 % ophthalmic suspension, Place 1 drop into the left eye daily., Disp: , Rfl:    pregabalin (LYRICA) 25 MG capsule, TAKE 1 CAPSULE BY  MOUTH TWICE DAILY (Patient taking differently: Take 25 mg by mouth 2 (two) times daily.), Disp: 180 capsule, Rfl: 1   sildenafil (REVATIO) 20 MG tablet, Take one hour prior to sexual activity (Patient taking differently: Take 20 mg by mouth as needed (ED). Take one hour prior to sexual activity), Disp: 10 tablet, Rfl: 0   No Known Allergies   Review of Systems  Constitutional:  Positive for fatigue. Negative for chills and fever.  HENT:  Negative for congestion, rhinorrhea and sinus pain.   Respiratory:  Negative for cough and wheezing.   Cardiovascular:  Negative for chest pain and palpitations.  Musculoskeletal:  Negative for arthralgias and myalgias.   Neurological:  Positive for dizziness, weakness and headaches. Negative for tremors, facial asymmetry, speech difficulty and numbness.  Psychiatric/Behavioral:  Negative for agitation, confusion and suicidal ideas.     Today's Vitals   04/30/21 1510  BP: 114/78  Pulse: 78  Temp: 98.7 F (37.1 C)  TempSrc: Oral  Weight: 162 lb 3.2 oz (73.6 kg)  Height: 5\' 5"  (1.651 m)  PainSc: 0-No pain   Body mass index is 26.99 kg/m.  Wt Readings from Last 3 Encounters:  04/30/21 162 lb 3.2 oz (73.6 kg)  04/08/21 156 lb (70.8 kg)  03/31/21 156 lb (70.8 kg)    BP Readings from Last 3 Encounters:  04/30/21 114/78  03/31/21 122/74  03/24/21 134/80    Objective:  Physical Exam Constitutional:      Appearance: Normal appearance.  HENT:     Head: Normocephalic and atraumatic.  Cardiovascular:     Rate and Rhythm: Normal rate and regular rhythm.     Pulses: Normal pulses.     Heart sounds: Normal heart sounds. No murmur heard. Pulmonary:     Effort: No respiratory distress.     Breath sounds: Normal breath sounds. No wheezing.  Skin:    General: Skin is warm and dry.     Capillary Refill: Capillary refill takes less than 2 seconds.  Neurological:     General: No focal deficit present.     Mental Status: He is alert and oriented to person, place, and time.     GCS: GCS eye subscore is 4. GCS verbal subscore is 5. GCS motor subscore is 6.     Cranial Nerves: Cranial nerves are intact.     Motor: No weakness, tremor or pronator drift.     Coordination: Coordination is intact. Romberg sign negative. Coordination normal. Heel to [facility] Test normal. Rapid alternating movements normal.     Gait: Gait is intact. Gait normal.     Deep Tendon Reflexes: Reflexes are normal and symmetric.  Psychiatric:        Mood and Affect: Mood normal.        Behavior: Behavior normal.        Assessment And Plan:     1. Unexplained night sweats - CBC with Diff -Will do blood work. Recent blood obtained  were essentially normal.   2. Dizziness - CT Head Wo Contrast; Future - CBC with Diff -will consider ENT referral   3. Acute nonintractable headache, unspecified headache type -Educated the patient if he experiences a thunderclap type of HA to go to the emergency room immediately. Educated the patient about the signes and symptoms or CVA. Patient verbalized understanding. Patient did not have any further questions.  - CT Head Wo Contrast; Future    Follow up: if symptoms persist or do not get better.   The  patient was encouraged to call or send a message through MyChart for any questions or concerns.   Staying healthy and adopting a healthy lifestyle for your overall health is important. You should eat 7 or more servings of fruits and vegetables per day. You should drink plenty of water to keep yourself hydrated and your kidneys healthy. This includes about 65-80+ fluid ounces of water. Limit your intake of animal fats especially for elevated cholesterol. Avoid highly processed food and limit your salt intake if you have hypertension. Avoid foods high in saturated/Trans fats. Along with a healthy diet it is also very important to maintain time for yourself to maintain a healthy mental health with low stress levels. You should get atleast 150 min of moderate intensity exercise weekly for a healthy heart. Along with eating right and exercising, aim for at least 7-9 hours of sleep daily.  Eat more whole grains which includes barley, wheat berries, oats, brown rice and whole wheat pasta. Use healthy plant oils which include olive, soy, corn, sunflower and peanut. Limit your caffeine and sugary drinks. Limit your intake of fast foods. Limit milk and dairy products to one or two daily servings.   Patient was given opportunity to ask questions. Patient verbalized understanding of the plan and was able to repeat key elements of the plan. All questions were answered to their satisfaction.  Raman Saylor Sheckler,  DNP   I, Raman Weslynn Ke have reviewed all documentation for this visit. The documentation on 04/30/21 for the exam, diagnosis, procedures, and orders are all accurate and complete.    IF YOU HAVE BEEN REFERRED TO A SPECIALIST, IT MAY TAKE 1-2 WEEKS TO SCHEDULE/PROCESS THE REFERRAL. IF YOU HAVE NOT HEARD FROM US/SPECIALIST IN TWO WEEKS, PLEASE GIVE [place] A CALL AT [phone].   THE PATIENT IS ENCOURAGED TO PRACTICE SOCIAL DISTANCING DUE TO THE COVID-19 PANDEMIC.

## 2021-05-01 ENCOUNTER — Encounter: Payer: Self-pay | Admitting: Internal Medicine

## 2021-05-02 LAB — HM HEPATITIS C SCREENING LAB: HM Hepatitis Screen: NEGATIVE

## 2021-05-13 ENCOUNTER — Encounter: Payer: Self-pay | Admitting: Internal Medicine

## 2021-05-18 ENCOUNTER — Ambulatory Visit: Payer: Medicare Other | Admitting: Cardiovascular Disease

## 2021-05-18 ENCOUNTER — Other Ambulatory Visit: Payer: Self-pay

## 2021-05-18 ENCOUNTER — Ambulatory Visit
Admission: RE | Admit: 2021-05-18 | Discharge: 2021-05-18 | Disposition: A | Discharge status: Home / Self Care | Payer: Medicare Other | Source: Ambulatory Visit | Attending: Nurse Practitioner | Admitting: Nurse Practitioner

## 2021-05-18 DIAGNOSIS — R42 Dizziness and giddiness: Secondary | ICD-10-CM | POA: Diagnosis not present

## 2021-05-18 DIAGNOSIS — R519 Headache, unspecified: Secondary | ICD-10-CM

## 2021-05-29 ENCOUNTER — Ambulatory Visit: Payer: Medicare Other | Admitting: Cardiovascular Disease

## 2021-06-22 ENCOUNTER — Other Ambulatory Visit: Payer: Self-pay

## 2021-06-23 ENCOUNTER — Other Ambulatory Visit: Payer: Self-pay

## 2021-06-23 ENCOUNTER — Ambulatory Visit (INDEPENDENT_AMBULATORY_CARE_PROVIDER_SITE_OTHER): Payer: Medicare Other | Admitting: Cardiology

## 2021-06-23 ENCOUNTER — Encounter: Payer: Self-pay | Admitting: Cardiology

## 2021-06-23 VITALS — BP 114/64 | HR 66 | Ht 67.0 in | Wt 164.0 lb

## 2021-06-23 DIAGNOSIS — I2510 Atherosclerotic heart disease of native coronary artery without angina pectoris: Secondary | ICD-10-CM

## 2021-06-23 DIAGNOSIS — R0609 Other forms of dyspnea: Secondary | ICD-10-CM

## 2021-06-23 DIAGNOSIS — E119 Type 2 diabetes mellitus without complications: Secondary | ICD-10-CM | POA: Diagnosis not present

## 2021-06-23 DIAGNOSIS — R0600 Dyspnea, unspecified: Secondary | ICD-10-CM

## 2021-06-23 DIAGNOSIS — I429 Cardiomyopathy, unspecified: Secondary | ICD-10-CM | POA: Diagnosis not present

## 2021-06-23 DIAGNOSIS — I2584 Coronary atherosclerosis due to calcified coronary lesion: Secondary | ICD-10-CM

## 2021-06-23 DIAGNOSIS — E785 Hyperlipidemia, unspecified: Secondary | ICD-10-CM | POA: Diagnosis not present

## 2021-06-23 NOTE — Patient Instructions (Signed)
Medication Instructions:  Your physician recommends that you continue on your current medications as directed. Please refer to the Current Medication list given to you today.  *If you need a refill on your cardiac medications before your next appointment, please call your pharmacy*   Lab Work: None If you have labs (blood work) drawn today and your tests are completely normal, you will receive your results only by: MyChart Message (if you have MyChart) OR A paper copy in the mail If you have any lab test that is abnormal or we need to change your treatment, we will call you to review the results.   Testing/Procedures: Your physician has requested that you have an echocardiogram. Echocardiography is a painless test that uses sound waves to create images of your heart. It provides your doctor with information about the size and shape of your heart and how well your heart's chambers and valves are working. This procedure takes approximately one hour. There are no restrictions for this procedure.    Follow-Up: At [facility], you and your health needs are our priority.  As part of our continuing mission to provide you with exceptional heart care, we have created designated Provider Care Teams.  These Care Teams include your primary Cardiologist (physician) and Advanced Practice Providers (APPs -  Physician Assistants and Nurse Practitioners) who all work together to provide you with the care you need, when you need it.  We recommend signing up for the patient portal called "MyChart".  Sign up information is provided on this After Visit Summary.  MyChart is used to connect with patients for Virtual Visits (Telemedicine).  Patients are able to view lab/test results, encounter notes, upcoming appointments, etc.  Non-urgent messages can be sent to your provider as well.   To learn more about what you can do with MyChart, go to [email or web address].    Your next appointment:   6  month(s)  The format for your next appointment:   In Person  Provider:   Robert Krasowski, MD   Other Instructions Echocardiogram An echocardiogram is a test that uses sound waves (ultrasound) to produce images of the heart. Images from an echocardiogram can provide important information about: Heart size and shape. The size and thickness and movement of your heart's walls. Heart muscle function and strength. Heart valve function or if you have stenosis. Stenosis is when the heart valves are too narrow. If blood is flowing backward through the heart valves (regurgitation). A tumor or infectious growth around the heart valves. Areas of heart muscle that are not working well because of poor blood flow or injury from a heart attack. Aneurysm detection. An aneurysm is a weak or damaged part of an artery wall. The wall bulges out from the normal force of blood pumping through the body. Tell a health care provider about: Any allergies you have. All medicines you are taking, including vitamins, herbs, eye drops, creams, and over-the-counter medicines. Any blood disorders you have. Any surgeries you have had. Any medical conditions you have. Whether you are pregnant or may be pregnant. What are the risks? Generally, this is a safe test. However, problems may occur, including an allergic reaction to dye (contrast) that may be used during the test. What happens before the test? No specific preparation is needed. You may eat and drink normally. What happens during the test?  You will take off your clothes from the waist up and put on a hospital gown. Electrodes or electrocardiogram (ECG)patches may be placed on   your chest. The electrodes or patches are then connected to a device that monitors your heart rate and rhythm. You will lie down on a table for an ultrasound exam. A gel will be applied to your chest to help sound waves pass through your skin. A handheld device, called a transducer,  will be pressed against your chest and moved over your heart. The transducer produces sound waves that travel to your heart and bounce back (or "echo" back) to the transducer. These sound waves will be captured in real-time and changed into images of your heart that can be viewed on a video monitor. The images will be recorded on a computer and reviewed by your health care provider. You may be asked to change positions or hold your breath for a short time. This makes it easier to get different views or better views of your heart. In some cases, you may receive contrast through an IV in one of your veins. This can improve the quality of the pictures from your heart. The procedure may vary among health care providers and hospitals. What can I expect after the test? You may return to your normal, everyday life, including diet, activities, andmedicines, unless your health care provider tells you not to do that. Follow these instructions at home: It is up to you to get the results of your test. Ask your health care provider, or the department that is doing the test, when your results will be ready. Keep all follow-up visits. This is important. Summary An echocardiogram is a test that uses sound waves (ultrasound) to produce images of the heart. Images from an echocardiogram can provide important information about the size and shape of your heart, heart muscle function, heart valve function, and other possible heart problems. You do not need to do anything to prepare before this test. You may eat and drink normally. After the echocardiogram is completed, you may return to your normal, everyday life, unless your health care provider tells you not to do that. This information is not intended to replace advice given to you by your health care provider. Make sure you discuss any questions you have with your healthcare provider. Document Revised: 05/20/2020 Document Reviewed: 05/20/2020 Elsevier Patient  Education  2022 [employer]   

## 2021-06-23 NOTE — Addendum Note (Signed)
Addended by: Reynolds Bowl on: 06/23/2021 09:31 AM   Modules accepted: Orders

## 2021-06-23 NOTE — Progress Notes (Addendum)
Cardiology Office Note:    Date:  06/23/2021   ID:  Steven [name], DOB [birth year], MRN [id number]  PCP:  Dorothyann Peng, MD  Cardiologist:  Gypsy Balsam, MD    Referring MD: Dorothyann Peng, MD   Chief Complaint  Patient presents with   Follow-up  I am doing fine  History of Present Illness:    Steven [name] is a 65 y.o. male   who is being seen today for the evaluation of chest pain and coronary artery disease at the request of Dorothyann Peng, MD. with past medical history significant for coronary artery disease.  In 2007 he did have a cardiac catheterization required stent to left anterior descending artery.  Last cardiac catheterization was done in 2010 showing mild diffuse in-stent restenosis, nuclear stress test done in 2014 was normal.  He also got history of hypertension, dyslipidemia, diabetes, he is legally blind to after corneal transplant.   I did see him last time in June when he came to me after visiting emergency room because of chest pain.  Biochemical markers were normal at that time I did elect to pursue stress testing which was performed.  That showed no evidence of ischemia, however his ejection fraction was 48%.  Since the time of seeing him last time he is doing well.  He denies have any chest pain tightness squeezing pressure burning chest.  There is no shortness of breath.  He likes to take long walks and he does do that with no difficulties.  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 eye with blurred vision 12/18/2011   HTN (hypertension) 12/18/2011   Hypertension     Past Surgical History:  Procedure  Laterality Date   CARDIAC SURGERY     stent placed in 2007   CORNEAL TRANSPLANT     CORONARY ANGIOPLASTY  2008   STENT PLACED IN LAD.MILD DIFFUSE IN-STENT RESTENOSIS IN 2010.   CORONARY STENT PLACEMENT     MYOCARDIAL PERFUSION STUDY  01/19/13   NORMAL STRESS NUCLEAR STUDY.   TRANSTHORACIC ECHOCARDIOGRAM  07/28/2009   LV= MILD LVH. EF 60% TO 65%.   VASCULAR US  01/15/09   NORMAL LOWER ARTERIAL DOPPLER    Current Medications: Current Meds  Medication Sig   aspirin EC 81 MG tablet Take 81 mg by mouth daily.   atorvastatin (LIPITOR) 40 MG tablet Take 1 tablet (40 mg total) by mouth daily.   cycloSPORINE (SANDIMMUNE) 25 MG capsule Take 75 mg by mouth 3 (three) times daily.   cycloSPORINE modified (NEORAL) 25 MG capsule Take 25 mg by mouth 3 (three) times daily.   dorzolamide-timolol (COSOPT) 22.3-6.8 MG/ML ophthalmic solution Place 1 drop into the left eye 2 (two) times daily.   folic acid (FOLVITE) 1 MG tablet Take 1 mg by mouth daily. 4 tabs daily   latanoprost (XALATAN) 0.005 % ophthalmic solution Place 1 drop into both eyes at bedtime.   LINZESS 290 MCG CAPS capsule Take 290 mcg by mouth daily.   lisinopril (ZESTRIL) 10 MG tablet TAKE 1 TABLET BY MOUTH EVERY DAY (Patient taking differently: Take 10 mg by mouth  daily.)   methotrexate (RHEUMATREX) 2.5 MG tablet Take 10 mg by mouth once a week. Fridays. Caution:Chemotherapy. Protect from light.   prednisoLONE acetate (PRED FORTE) 1 % ophthalmic suspension Place 1 drop into the left eye daily.   pregabalin (LYRICA) 25 MG capsule TAKE 1 CAPSULE BY MOUTH TWICE DAILY (Patient taking differently: Take 25 mg by mouth 2 (two) times daily.)   sildenafil (REVATIO) 20 MG tablet Take one hour prior to sexual activity (Patient taking differently: Take 20 mg by mouth as needed (ED). Take one hour prior to sexual activity)     Allergies:   Patient has no known allergies.   Social History   Socioeconomic History   Marital status: Divorced    Spouse  name: Not on file   Number of children: Not on file   Years of education: Not on file   Highest education level: Not on file  Occupational History   Occupation: disability  Tobacco Use   Smoking status: Former    Packs/day: 0.75    Years: 20.00    Pack years: 15.00    Types: Cigarettes    Start date: 10/11/1981    Quit date: 10/11/2001    Years since quitting: 19.7   Smokeless tobacco: Never  Vaping Use   Vaping Use: Never used  Substance and Sexual Activity   Alcohol use: No    Alcohol/week: 0.0 standard drinks   Drug use: Not Currently   Sexual activity: Yes  Other Topics Concern   Not on file  Social History Narrative   Not on file   Social Determinants of Health   Financial Resource Strain: Low Risk    Difficulty of Paying Living Expenses: Not hard at all  Food Insecurity: No Food Insecurity   Worried About Programme researcher, broadcasting/film/video in the Last Year: Never true   Ran Out of Food in the Last Year: Never true  Transportation Needs: No Transportation Needs   Lack of Transportation (Medical): No   Lack of Transportation (Non-Medical): No  Physical Activity: Sufficiently Active   Days of Exercise per Week: 7 days   Minutes of Exercise per Session: 60 min  Stress: No Stress Concern Present   Feeling of Stress : Not at all  Social Connections: Not on file     Family History: The patient's family history includes Diabetes in his mother; Heart attack in his mother; Hypertension in his father. ROS:   Please see the history of present illness.    All 14 point review of systems negative except as described per history of present illness  EKGs/Labs/Other Studies Reviewed:      Recent Labs: 03/02/2021: ALT 23; TSH 1.090 03/18/2021: BUN 15; Creatinine, Ser 1.03; Potassium 4.5; Sodium 136 03/24/2021: Magnesium 2.1 04/30/2021: Hemoglobin 13.1; Platelets 176  Recent Lipid Panel    Component Value Date/Time   CHOL 194 03/02/2021 1108   TRIG 60 03/02/2021 1108   HDL 75 03/02/2021  1108   CHOLHDL 2.6 03/02/2021 1108   CHOLHDL 2.6 12/19/2011 0138   VLDL 12 12/19/2011 0138   LDLCALC 108 (H) 03/02/2021 1108    Physical Exam:    VS:  BP 114/64 (BP Location: Right Arm, Patient Position: Sitting)   Pulse 66   Ht 5\' 7"  (1.702 m)   Wt 164 lb (74.4 kg)   SpO2 99%   BMI 25.69 kg/m     Wt Readings from Last 3 Encounters:  06/23/21 164 lb (74.4 kg)  04/30/21 162 lb 3.2 oz (73.6  kg)  04/08/21 156 lb (70.8 kg)     GEN:  Well nourished, well developed in no acute distress HEENT: Normal NECK: No JVD; No carotid bruits LYMPHATICS: No lymphadenopathy CARDIAC: RRR, no murmurs, no rubs, no gallops RESPIRATORY:  Clear to auscultation without rales, wheezing or rhonchi  ABDOMEN: Soft, non-tender, non-distended MUSCULOSKELETAL:  No edema; No deformity  SKIN: Warm and dry LOWER EXTREMITIES: no swelling NEUROLOGIC:  Alert and oriented x 3 PSYCHIATRIC:  Normal affect   ASSESSMENT:    1. Coronary atherosclerosis due to calcified coronary lesion (CODE)   2. Coronary artery disease involving native coronary artery of native heart without angina pectoris   3. Diabetes mellitus type 2 in nonobese (HCC)   4. Dyslipidemia    PLAN:    In order of problems listed above:  Coronary artery disease.  Last stress test reviewed with the patient showing no evidence of ischemia.  Concerning is the fact that calculated ejection fraction was 48%.  I will schedule him to have echocardiogram to reassess left ventricle ejection fraction. Type 2 diabetes that be follow-up by internal medicine team.  Apparently stable.  I do have latest hemoglobin A1c from May which is 6.2 which is excellent.  Continue present management. Dyslipidemia last time I seen him I did increase his Lipitor.  I did review K PN which show LDL of 108 which is unacceptably high for this gentleman.  I will recheck his fasting lipid profile today to check effect of higher dose of Lipitor. We did talk about healthy lifestyle  need to exercise on the regular basis which she understands and trying to do.  He does not smoke.   Medication Adjustments/Labs and Tests Ordered: Current medicines are reviewed at length with the patient today.  Concerns regarding medicines are outlined above.  No orders of the defined types were placed in this encounter.  Medication changes: No orders of the defined types were placed in this encounter.   Signed, Georgeanna Lea, MD, [facility] 06/23/2021 9:11 AM    [facility]

## 2021-07-06 ENCOUNTER — Other Ambulatory Visit: Payer: Self-pay

## 2021-07-06 ENCOUNTER — Ambulatory Visit (HOSPITAL_BASED_OUTPATIENT_CLINIC_OR_DEPARTMENT_OTHER)
Admission: RE | Admit: 2021-07-06 | Discharge: 2021-07-06 | Disposition: A | Discharge status: Home / Self Care | Payer: Medicare Other | Source: Ambulatory Visit | Attending: Cardiology | Admitting: Cardiology

## 2021-07-06 DIAGNOSIS — I429 Cardiomyopathy, unspecified: Secondary | ICD-10-CM

## 2021-07-06 DIAGNOSIS — R0600 Dyspnea, unspecified: Secondary | ICD-10-CM

## 2021-07-06 DIAGNOSIS — R0609 Other forms of dyspnea: Secondary | ICD-10-CM

## 2021-07-06 LAB — ECHOCARDIOGRAM COMPLETE
AR max vel: 2.34 cm2
AV Area VTI: 2.12 cm2
AV Area mean vel: 2.21 cm2
AV Mean grad: 4 mmHg
AV Peak grad: 7.2 mmHg
Ao pk vel: 1.34 m/s
Area-P 1/2: 3.1 cm2
Calc EF: 58.4 %
S' Lateral: 3 cm
Single Plane A2C EF: 67 %
Single Plane A4C EF: 53 %

## 2021-07-22 DIAGNOSIS — H30033 Focal chorioretinal inflammation, peripheral, bilateral: Secondary | ICD-10-CM | POA: Diagnosis not present

## 2021-07-22 DIAGNOSIS — H18421 Band keratopathy, right eye: Secondary | ICD-10-CM | POA: Diagnosis not present

## 2021-07-22 DIAGNOSIS — Z9889 Other specified postprocedural states: Secondary | ICD-10-CM | POA: Diagnosis not present

## 2021-07-22 DIAGNOSIS — Z961 Presence of intraocular lens: Secondary | ICD-10-CM | POA: Diagnosis not present

## 2021-08-06 DIAGNOSIS — H1812 Bullous keratopathy, left eye: Secondary | ICD-10-CM | POA: Diagnosis not present

## 2021-08-11 DIAGNOSIS — H30033 Focal chorioretinal inflammation, peripheral, bilateral: Secondary | ICD-10-CM | POA: Diagnosis not present

## 2021-08-11 DIAGNOSIS — H209 Unspecified iridocyclitis: Secondary | ICD-10-CM | POA: Diagnosis not present

## 2021-08-11 DIAGNOSIS — Z79899 Other long term (current) drug therapy: Secondary | ICD-10-CM | POA: Diagnosis not present

## 2021-08-11 DIAGNOSIS — H3581 Retinal edema: Secondary | ICD-10-CM | POA: Diagnosis not present

## 2021-08-11 DIAGNOSIS — H4042X2 Glaucoma secondary to eye inflammation, left eye, moderate stage: Secondary | ICD-10-CM | POA: Diagnosis not present

## 2021-08-11 DIAGNOSIS — H402224 Chronic angle-closure glaucoma, left eye, indeterminate stage: Secondary | ICD-10-CM | POA: Diagnosis not present

## 2021-08-11 DIAGNOSIS — H44521 Atrophy of globe, right eye: Secondary | ICD-10-CM | POA: Diagnosis not present

## 2021-08-11 DIAGNOSIS — H35033 Hypertensive retinopathy, bilateral: Secondary | ICD-10-CM | POA: Diagnosis not present

## 2021-08-11 DIAGNOSIS — Z947 Corneal transplant status: Secondary | ICD-10-CM | POA: Diagnosis not present

## 2021-08-20 ENCOUNTER — Other Ambulatory Visit: Payer: Self-pay | Admitting: Internal Medicine

## 2021-09-03 ENCOUNTER — Other Ambulatory Visit: Payer: Self-pay | Admitting: Internal Medicine

## 2021-09-15 ENCOUNTER — Ambulatory Visit: Payer: Medicare Other | Admitting: Internal Medicine

## 2021-09-15 ENCOUNTER — Encounter: Payer: Medicare Other | Admitting: Nurse Practitioner

## 2021-10-14 ENCOUNTER — Other Ambulatory Visit: Payer: Self-pay

## 2021-10-14 ENCOUNTER — Ambulatory Visit (INDEPENDENT_AMBULATORY_CARE_PROVIDER_SITE_OTHER): Payer: Commercial Managed Care - HMO

## 2021-10-14 VITALS — BP 118/62 | HR 81 | Temp 98.1°F | Ht 67.0 in | Wt 168.8 lb

## 2021-10-14 DIAGNOSIS — N5201 Erectile dysfunction due to arterial insufficiency: Secondary | ICD-10-CM

## 2021-10-14 DIAGNOSIS — Z0000 Encounter for general adult medical examination without abnormal findings: Secondary | ICD-10-CM

## 2021-10-14 MED ORDER — SILDENAFIL CITRATE 20 MG PO TABS: ORAL_TABLET | ORAL | 0 refills | Status: DC

## 2021-10-14 NOTE — Progress Notes (Signed)
This visit occurred during the SARS-CoV-2 public health emergency.  Safety protocols were in place, including screening questions prior to the visit, additional usage of staff PPE, and extensive cleaning of exam room while observing appropriate contact time as indicated for disinfecting solutions.  Subjective:   Steven [name] is a 66 y.o. male who presents for Medicare Annual/Subsequent preventive examination.  Review of Systems     Cardiac Risk Factors include: advanced age (>76men, >9 women);diabetes mellitus;dyslipidemia;hypertension;male gender     Objective:    Today's Vitals   10/14/21 1508  BP: 118/62  Pulse: 81  Temp: 98.1 F (36.7 C)  TempSrc: Oral  SpO2: 97%  Weight: 168 lb 12.8 oz (76.6 kg)  Height: 5\' 7"  (1.702 m)   Body mass index is 26.44 kg/m.  Advanced Directives 10/14/2021 03/18/2021 01/22/2021 01/10/2020 07/13/2019 01/04/2019 08/16/2016  Does Patient Have a Medical Advance Directive? No No No No No Yes No  Type of Advance Directive - - - - - Banker in Chart? - - - - - No - copy requested -  Would patient like information on creating a medical advance directive? No - Patient declined No - Patient declined No - Patient declined No - Patient declined No - Patient declined - No - patient declined information  Pre-existing out of facility DNR order (yellow form or pink MOST form) - - - - - - -    Current Medications (verified) Outpatient Encounter Medications as of 10/14/2021  Medication Sig   aspirin EC 81 MG tablet Take 81 mg by mouth daily.   cycloSPORINE (SANDIMMUNE) 25 MG capsule Take 75 mg by mouth 3 (three) times daily.   cycloSPORINE modified (NEORAL) 25 MG capsule Take 25 mg by mouth 3 (three) times daily.   dorzolamide-timolol (COSOPT) 22.3-6.8 MG/ML ophthalmic solution Place 1 drop into the left eye 2 (two) times daily.   folic acid (FOLVITE) 1 MG tablet Take 1 mg by mouth daily. 4 tabs daily    latanoprost (XALATAN) 0.005 % ophthalmic solution Place 1 drop into both eyes at bedtime.   LINZESS 290 MCG CAPS capsule Take 290 mcg by mouth daily.   lisinopril (ZESTRIL) 10 MG tablet TAKE 1 TABLET BY MOUTH EVERY DAY   methotrexate (RHEUMATREX) 2.5 MG tablet Take 10 mg by mouth once a week. Fridays. Caution:Chemotherapy. Protect from light.   prednisoLONE acetate (PRED FORTE) 1 % ophthalmic suspension Place 1 drop into the left eye daily.   pregabalin (LYRICA) 25 MG capsule TAKE 1 CAPSULE BY MOUTH TWICE DAILY   sildenafil (REVATIO) 20 MG tablet Take one hour prior to sexual activity (Patient taking differently: Take 20 mg by mouth as needed (ED). Take one hour prior to sexual activity)   atorvastatin (LIPITOR) 40 MG tablet Take 1 tablet (40 mg total) by mouth daily.   No facility-administered encounter medications on file as of 10/14/2021.    Allergies (verified) Patient has no known allergies.   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 eye with blurred vision 12/18/2011   HTN (hypertension) 12/18/2011  Hypertension    Past Surgical History:  Procedure Laterality Date   CARDIAC SURGERY     stent placed in 2007   [place]  2008   STENT PLACED IN LAD.MILD DIFFUSE IN-STENT RESTENOSIS IN 2010.   CORONARY STENT PLACEMENT     MYOCARDIAL PERFUSION STUDY  01/19/13   NORMAL STRESS NUCLEAR STUDY.   TRANSTHORACIC ECHOCARDIOGRAM  07/28/2009   LV= MILD LVH. EF 60% TO 65%.   VASCULAR US  01/15/09   NORMAL LOWER ARTERIAL DOPPLER   Family History  Problem Relation Age of Onset   Heart attack Mother     Diabetes Mother    Hypertension Father    Social History   Socioeconomic History   Marital status: Divorced    Spouse name: Not on file   Number of children: Not on file   Years of education: Not on file   Highest education level: Not on file  Occupational History   Occupation: disability  Tobacco Use   Smoking status: Former    Packs/day: 0.75    Years: 20.00    Pack years: 15.00    Types: Cigarettes    Start date: 10/11/1981    Quit date: 10/11/2001    Years since quitting: 20.0   Smokeless tobacco: Never  Vaping Use   Vaping Use: Never used  Substance and Sexual Activity   Alcohol use: No    Alcohol/week: 0.0 standard drinks   Drug use: Not Currently   Sexual activity: Yes  Other Topics Concern   Not on file  Social History Narrative   Not on file   Social Determinants of Health   Financial Resource Strain: Low Risk    Difficulty of Paying Living Expenses: Not hard at all  Food Insecurity: No Food Insecurity   Worried About Charity fundraiser in the Last Year: Never true   [place] in the Last Year: Never true  Transportation Needs: No Transportation Needs   Lack of Transportation (Medical): No   Lack of Transportation (Non-Medical): No  Physical Activity: Sufficiently Active   Days of Exercise per Week: 7 days   Minutes of Exercise per Session: 30 min  Stress: No Stress Concern Present   Feeling of Stress : Not at all  Social Connections: Not on file    Tobacco Counseling Counseling given: Not Answered   Clinical Intake:  Pre-visit preparation completed: Yes  Pain : No/denies pain     Nutritional Status: BMI 25 -29 Overweight Nutritional Risks: None Diabetes: Yes  How often do you need to have someone help you when you read instructions, pamphlets, or other written materials from your doctor or pharmacy?: 1 - Never What is the last grade level you completed in school?: 11th grade  Diabetic? Yesdoes not Nutrition Risk Assessment:  Has  the patient had any N/V/D within the last 2 months?  No  Does the patient have any non-healing wounds?  No  Has the patient had any unintentional weight loss or weight gain?  No   Diabetes:  Is the patient diabetic?  Yes  If diabetic, was a CBG obtained today?  No  Did the patient bring in their glucometer from home?  No  How often do you monitor your CBG's? Does not.   Financial Strains and Diabetes Management:  Are you having any financial strains with the device, your supplies or your medication? No .  Does the patient want to be seen by  Chronic Care Management for management of their diabetes?  No  Would the patient like to be referred to a Nutritionist or for Diabetic Management?  No   Diabetic Exams:  Diabetic Eye Exam: Completed 01/23/2021 Diabetic Foot Exam: Overdue, Pt has been advised about the importance in completing this exam. Pt is scheduled for diabetic foot exam on next appointment.   Interpreter Needed?: No  Information entered by :: NAllen LPN   Activities of Daily Living In your present state of health, do you have any difficulty performing the following activities: 10/14/2021 01/22/2021  Hearing? N N  Vision? N N  Difficulty concentrating or making decisions? N N  Walking or climbing stairs? N N  Dressing or bathing? N N  Doing errands, shopping? N N  Preparing Food and eating ? N N  Using the Toilet? N N  In the past six months, have you accidently leaked urine? N N  Do you have problems with loss of bowel control? N N  Managing your Medications? N N  Managing your Finances? N N  Housekeeping or managing your Housekeeping? N N  Some recent data might be hidden    Patient Care Team: Dorothyann Peng, MD as PCP - General (Internal Medicine) Holli Humbles, MD as Referring Physician (Ophthalmology)  Indicate any recent Medical Services you may have received from other than [facility] providers in the past year (date may be approximate).     Assessment:    This is a routine wellness examination for [place].  Hearing/Vision screen Vision Screening - Comments:: Regular Eye Exams, [facility]  Dietary issues and exercise activities discussed: Current Exercise Habits: Home exercise routine, Type of exercise: walking, Time (Minutes): 30, Frequency (Times/Week): 7, Weekly Exercise (Minutes/Week): 210   Goals Addressed             This Visit's Progress    Patient Stated       10/14/2021, no goals       Depression Screen PHQ 2/9 Scores 10/14/2021 01/22/2021 09/02/2020 02/18/2020 01/10/2020 03/19/2019 01/04/2019  PHQ - 2 Score 0 0 0 0 0 0 0  PHQ- 9 Score - - - 0 0 - 0    Fall Risk Fall Risk  10/14/2021 01/22/2021 02/18/2020 01/10/2020 03/19/2019  Falls in the past year? 0 0 0 0 0  Number falls in past yr: - - 0 - -  Injury with Fall? - - 0 - -  Risk for fall due to : Medication side effect Medication side effect - Medication side effect -  Follow up Falls evaluation completed;Education provided;Falls prevention discussed Falls evaluation completed;Education provided;Falls prevention discussed - Falls evaluation completed;Education provided;Falls prevention discussed -    FALL RISK PREVENTION PERTAINING TO THE HOME:  Any stairs in or around the home? Yes  If so, are there any without handrails? No  Home free of loose throw rugs in walkways, pet beds, electrical cords, etc? Yes  Adequate lighting in your home to reduce risk of falls? Yes   ASSISTIVE DEVICES UTILIZED TO PREVENT FALLS:  Life alert? No  Use of a cane, walker or w/c? No  Grab bars in the bathroom? No  Shower chair or bench in shower? No  Elevated toilet seat or a handicapped toilet? No   TIMED UP AND GO:  Was the test performed? No .    Gait steady and fast without use of assistive device  Cognitive Function:     6CIT Screen 10/14/2021 01/22/2021 01/10/2020 01/04/2019  What Year?  0 points 0 points 0 points 0 points  What month? 0 points 0 points 0 points 0 points   What time? 0 points 0 points 0 points 0 points  Count back from 20 0 points 0 points 0 points 0 points  Months in reverse 0 points 0 points 0 points 0 points  Repeat phrase 2 points 8 points 2 points 0 points  Total Score 2 8 2  0    Immunizations  There is no immunization history on file for this patient.  TDAP status: Up to date  Flu Vaccine status: Declined, Education has been provided regarding the importance of this vaccine but patient still declined. Advised may receive this vaccine at local pharmacy or Health Dept. Aware to provide a copy of the vaccination record if obtained from local pharmacy or Health Dept. Verbalized acceptance and understanding.  Pneumococcal vaccine status: Declined,  Education has been provided regarding the importance of this vaccine but patient still declined. Advised may receive this vaccine at local pharmacy or Health Dept. Aware to provide a copy of the vaccination record if obtained from local pharmacy or Health Dept. Verbalized acceptance and understanding.   Covid-19 vaccine status: Declined, Education has been provided regarding the importance of this vaccine but patient still declined. Advised may receive this vaccine at local pharmacy or Health Dept.or vaccine clinic. Aware to provide a copy of the vaccination record if obtained from local pharmacy or Health Dept. Verbalized acceptance and understanding.  Qualifies for Shingles Vaccine? Yes   Zostavax completed No   Shingrix Completed?: No.    Education has been provided regarding the importance of this vaccine. Patient has been advised to call insurance company to determine out of pocket expense if they have not yet received this vaccine. Advised may also receive vaccine at local pharmacy or Health Dept. Verbalized acceptance and understanding.  Screening Tests Health Maintenance  Topic Date Due   HIV Screening  Never done   FOOT EXAM  09/02/2021   HEMOGLOBIN A1C  09/02/2021   COVID-19 Vaccine  (1) 10/30/2021 (Originally 11/27/1956)   INFLUENZA VACCINE  01/08/2022 (Originally 05/11/2021)   Zoster Vaccines- Shingrix (1 of 2) 01/12/2022 (Originally [birth year]1976)   Pneumococcal Vaccine 42-72 Years old (1 - PCV) 03/24/2022 (Originally 05/28/1962)   OPHTHALMOLOGY EXAM  01/23/2022   COLONOSCOPY (Pts 45-61yrs Insurance coverage will need to be confirmed)  07/03/2023   TETANUS/TDAP  07/07/2028   Hepatitis C Screening  Completed   HPV VACCINES  Aged Out    Health Maintenance  Health Maintenance Due  Topic Date Due   HIV Screening  Never done   FOOT EXAM  09/02/2021   HEMOGLOBIN A1C  09/02/2021    Colorectal cancer screening: Type of screening: Colonoscopy. Completed 07/02/2013. Repeat every 10 years  Lung Cancer Screening: (Low Dose CT Chest recommended if Age 48-80 years, 30 pack-year currently smoking OR have quit w/in 15years.) does not qualify.   Lung Cancer Screening Referral: no  Additional Screening:  Hepatitis C Screening: does qualify; Completed 05/02/2021  Vision Screening: Recommended annual ophthalmology exams for early detection of glaucoma and other disorders of the eye. Is the patient up to date with their annual eye exam?  Yes  Who is the provider or what is the name of the office in which the patient attends annual eye exams? [place] If pt is not established with a provider, would they like to be referred to a provider to establish care? No .   Dental Screening: Recommended  annual dental exams for proper oral hygiene  Community Resource Referral / Chronic Care Management: CRR required this visit?  No   CCM required this visit?  No      Plan:     I have personally reviewed and noted the following in the patients chart:   Medical and social history Use of alcohol, tobacco or illicit drugs  Current medications and supplements including opioid prescriptions. Patient is not currently taking opioid prescriptions. Functional ability and  status Nutritional status Physical activity Advanced directives List of other physicians Hospitalizations, surgeries, and ER visits in previous 12 months Vitals Screenings to include cognitive, depression, and falls Referrals and appointments  In addition, I have reviewed and discussed with patient certain preventive protocols, quality metrics, and best practice recommendations. A written personalized care plan for preventive services as well as general preventive health recommendations were provided to patient.     Kellie Simmering, LPN   [id number]   Nurse Notes: none

## 2021-10-14 NOTE — Patient Instructions (Signed)
Steven [name] , Thank you for taking time to come for your Medicare Wellness Visit. I appreciate your ongoing commitment to your health goals. Please review the following plan we discussed and let me know if I can assist you in the future.   Screening recommendations/referrals: Colonoscopy: completed 07/02/2013 Recommended yearly ophthalmology/optometry visit for glaucoma screening and checkup Recommended yearly dental visit for hygiene and checkup  Vaccinations: Influenza vaccine: decline Pneumococcal vaccine: decline Tdap vaccine: completed 07/07/2018, due 07/07/2028 Shingles vaccine: decline   Covid-19: decline  Advanced directives: Advance directive discussed with you today. Even though you declined this today please call our office should you change your mind and we can give you the proper paperwork for you to fill out.  Conditions/risks identified: none  Next appointment: Follow up in one year for your annual wellness visit   Preventive Care 40-64 Years, Male Preventive care refers to lifestyle choices and visits with your health care provider that can promote health and wellness. What does preventive care include? A yearly physical exam. This is also called an annual well check. Dental exams once or twice a year. Routine eye exams. Ask your health care provider how often you should have your eyes checked. Personal lifestyle choices, including: Daily care of your teeth and gums. Regular physical activity. Eating a healthy diet. Avoiding tobacco and drug use. Limiting alcohol use. Practicing safe sex. Taking low-dose aspirin every day starting at age 54. What happens during an annual well check? The services and screenings done by your health care provider during your annual well check will depend on your age, overall health, lifestyle risk factors, and family history of disease. Counseling  Your health care provider may ask you questions about your: Alcohol use. Tobacco  use. Drug use. Emotional well-being. Home and relationship well-being. Sexual activity. Eating habits. Work and work Astronomer. Screening  You may have the following tests or measurements: Height, weight, and BMI. Blood pressure. Lipid and cholesterol levels. These may be checked every 5 years, or more frequently if you are over 88 years old. Skin check. Lung cancer screening. You may have this screening every year starting at age 36 if you have a 30-pack-year history of smoking and currently smoke or have quit within the past 15 years. Fecal occult blood test (FOBT) of the stool. You may have this test every year starting at age 62. Flexible sigmoidoscopy or colonoscopy. You may have a sigmoidoscopy every 5 years or a colonoscopy every 10 years starting at age 17. Prostate cancer screening. Recommendations will vary depending on your family history and other risks. Hepatitis C blood test. Hepatitis B blood test. Sexually transmitted disease (STD) testing. Diabetes screening. This is done by checking your blood sugar (glucose) after you have not eaten for a while (fasting). You may have this done every 1-3 years. Discuss your test results, treatment options, and if necessary, the need for more tests with your health care provider. Vaccines  Your health care provider may recommend certain vaccines, such as: Influenza vaccine. This is recommended every year. Tetanus, diphtheria, and acellular pertussis (Tdap, Td) vaccine. You may need a Td booster every 10 years. Zoster vaccine. You may need this after age 10. Pneumococcal 13-valent conjugate (PCV13) vaccine. You may need this if you have certain conditions and have not been vaccinated. Pneumococcal polysaccharide (PPSV23) vaccine. You may need one or two doses if you smoke cigarettes or if you have certain conditions. Talk to your health care provider about which screenings and vaccines you  need and how often you need them. This  information is not intended to replace advice given to you by your health care provider. Make sure you discuss any questions you have with your health care provider. Document Released: 10/24/2015 Document Revised: 06/16/2016 Document Reviewed: 07/29/2015 Elsevier Interactive Patient Education  2017 [place] Prevention in the Home Falls can cause injuries. They can happen to people of all ages. There are many things you can do to make your home safe and to help prevent falls. What can I do on the outside of my home? Regularly fix the edges of walkways and driveways and fix any cracks. Remove anything that might make you trip as you walk through a door, such as a raised step or threshold. Trim any bushes or trees on the path to your home. Use bright outdoor lighting. Clear any walking paths of anything that might make someone trip, such as rocks or tools. Regularly check to see if handrails are loose or broken. Make sure that both sides of any steps have handrails. Any raised decks and porches should have guardrails on the edges. Have any leaves, snow, or ice cleared regularly. Use sand or salt on walking paths during winter. Clean up any spills in your garage right away. This includes oil or grease spills. What can I do in the bathroom? Use night lights. Install grab bars by the toilet and in the tub and shower. Do not use towel bars as grab bars. Use non-skid mats or decals in the tub or shower. If you need to sit down in the shower, use a plastic, non-slip stool. Keep the floor dry. Clean up any water that spills on the floor as soon as it happens. Remove soap buildup in the tub or shower regularly. Attach bath mats securely with double-sided non-slip rug tape. Do not have throw rugs and other things on the floor that can make you trip. What can I do in the bedroom? Use night lights. Make sure that you have a light by your bed that is easy to reach. Do not use any sheets or  blankets that are too big for your bed. They should not hang down onto the floor. Have a firm chair that has side arms. You can use this for support while you get dressed. Do not have throw rugs and other things on the floor that can make you trip. What can I do in the kitchen? Clean up any spills right away. Avoid walking on wet floors. Keep items that you use a lot in easy-to-reach places. If you need to reach something above you, use a strong step stool that has a grab bar. Keep electrical cords out of the way. Do not use floor polish or wax that makes floors slippery. If you must use wax, use non-skid floor wax. Do not have throw rugs and other things on the floor that can make you trip. What can I do with my stairs? Do not leave any items on the stairs. Make sure that there are handrails on both sides of the stairs and use them. Fix handrails that are broken or loose. Make sure that handrails are as long as the stairways. Check any carpeting to make sure that it is firmly attached to the stairs. Fix any carpet that is loose or worn. Avoid having throw rugs at the top or bottom of the stairs. If you do have throw rugs, attach them to the floor with carpet tape. Make sure that  them. Fix handrails that are broken or loose. Make sure that handrails are as long as the stairways.  Check any carpeting to make sure that it is firmly attached to the stairs. Fix any carpet that is loose or worn.  Avoid having throw rugs at the top or bottom of the stairs. If you do have throw rugs, attach them to the floor with carpet tape.  Make sure that you have a light switch at the top of the stairs and the bottom of the stairs. If you do not have them, ask someone to add them for you. What else can I do to help prevent falls?  Wear shoes that:  Do not have high heels.  Have rubber bottoms.  Are comfortable and fit you well.  Are closed at the toe. Do not wear sandals.  If you use a stepladder:  Make sure that it is fully opened. Do not climb a closed stepladder.  Make sure that both sides of the stepladder are locked into place.  Ask someone to hold it for you, if possible.  Clearly mark and make  sure that you can see:  Any grab bars or handrails.  First and last steps.  Where the edge of each step is.  Use tools that help you move around (mobility aids) if they are needed. These include:  Canes.  Walkers.  Scooters.  Crutches.  Turn on the lights when you go into a dark area. Replace any light bulbs as soon as they burn out.  Set up your furniture so you have a clear path. Avoid moving your furniture around.  If any of your floors are uneven, fix them.  If there are any pets around you, be aware of where they are.  Review your medicines with your doctor. Some medicines can make you feel dizzy. This can increase your chance of falling. Ask your doctor what other things that you can do to help prevent falls. This information is not intended to replace advice given to you by your health care provider. Make sure you discuss any questions you have with your health care provider. Document Released: 07/24/2009 Document Revised: 03/04/2016 Document Reviewed: 11/01/2014 Elsevier Interactive Patient Education  2017 [employer]  

## 2021-10-27 DIAGNOSIS — H30033 Focal chorioretinal inflammation, peripheral, bilateral: Secondary | ICD-10-CM | POA: Diagnosis not present

## 2021-10-27 DIAGNOSIS — Z79899 Other long term (current) drug therapy: Secondary | ICD-10-CM | POA: Diagnosis not present

## 2021-11-03 DIAGNOSIS — H30033 Focal chorioretinal inflammation, peripheral, bilateral: Secondary | ICD-10-CM | POA: Diagnosis not present

## 2021-11-03 DIAGNOSIS — Z79899 Other long term (current) drug therapy: Secondary | ICD-10-CM | POA: Diagnosis not present

## 2021-11-03 DIAGNOSIS — H35033 Hypertensive retinopathy, bilateral: Secondary | ICD-10-CM | POA: Diagnosis not present

## 2021-11-03 DIAGNOSIS — H44521 Atrophy of globe, right eye: Secondary | ICD-10-CM | POA: Diagnosis not present

## 2021-11-03 DIAGNOSIS — Z947 Corneal transplant status: Secondary | ICD-10-CM | POA: Diagnosis not present

## 2021-11-03 DIAGNOSIS — H3581 Retinal edema: Secondary | ICD-10-CM | POA: Diagnosis not present

## 2021-11-03 DIAGNOSIS — H4042X2 Glaucoma secondary to eye inflammation, left eye, moderate stage: Secondary | ICD-10-CM | POA: Diagnosis not present

## 2021-11-03 DIAGNOSIS — H402224 Chronic angle-closure glaucoma, left eye, indeterminate stage: Secondary | ICD-10-CM | POA: Diagnosis not present

## 2021-11-03 DIAGNOSIS — H209 Unspecified iridocyclitis: Secondary | ICD-10-CM | POA: Diagnosis not present

## 2021-12-22 ENCOUNTER — Other Ambulatory Visit: Payer: Self-pay

## 2021-12-22 ENCOUNTER — Ambulatory Visit (INDEPENDENT_AMBULATORY_CARE_PROVIDER_SITE_OTHER): Payer: Medicare Other | Admitting: Nurse Practitioner

## 2021-12-22 ENCOUNTER — Encounter: Payer: Self-pay | Admitting: Nurse Practitioner

## 2021-12-22 VITALS — BP 110/78 | HR 63 | Temp 97.5°F | Ht 67.0 in | Wt 167.2 lb

## 2021-12-22 DIAGNOSIS — E1140 Type 2 diabetes mellitus with diabetic neuropathy, unspecified: Secondary | ICD-10-CM | POA: Diagnosis not present

## 2021-12-22 DIAGNOSIS — I119 Hypertensive heart disease without heart failure: Secondary | ICD-10-CM

## 2021-12-22 DIAGNOSIS — E119 Type 2 diabetes mellitus without complications: Secondary | ICD-10-CM | POA: Diagnosis not present

## 2021-12-22 DIAGNOSIS — I10 Essential (primary) hypertension: Secondary | ICD-10-CM | POA: Diagnosis not present

## 2021-12-22 DIAGNOSIS — Z114 Encounter for screening for human immunodeficiency virus [HIV]: Secondary | ICD-10-CM

## 2021-12-22 DIAGNOSIS — Z79899 Other long term (current) drug therapy: Secondary | ICD-10-CM | POA: Diagnosis not present

## 2021-12-22 DIAGNOSIS — Z0000 Encounter for general adult medical examination without abnormal findings: Secondary | ICD-10-CM

## 2021-12-22 DIAGNOSIS — I2510 Atherosclerotic heart disease of native coronary artery without angina pectoris: Secondary | ICD-10-CM | POA: Diagnosis not present

## 2021-12-22 DIAGNOSIS — E785 Hyperlipidemia, unspecified: Secondary | ICD-10-CM | POA: Diagnosis not present

## 2021-12-22 NOTE — Progress Notes (Signed)
?I,[name],acting as a scribe for Minette Brine, FNP.,have documented all relevant documentation on the behalf of Minette Brine, FNP,as directed by  Minette Brine, FNP while in the presence of Minette Brine, [place].  ? ?This visit occurred during the SARS-CoV-2 public health emergency.  Safety protocols were in place, including screening questions prior to the visit, additional usage of staff PPE, and extensive cleaning of exam room while observing appropriate contact time as indicated for disinfecting solutions. ? ?Subjective:  ?  ? Patient ID: Steven [name] , male    DOB: 1955-10-29 , 66 y.o.   MRN: [id number] ? ? ?Chief Complaint  ?Patient presents with  ? Annual Exam  ? ? ?HPI ? ?Pt presents for HM. He has no questions or concerns at the moment.  ?  ? ?Past Medical History:  ?Diagnosis Date  ? 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 CAD (coronary artery disease), known LAD stent placed in 2007 in [place]. ,last cath 2010 with patent stent and nonobstructive CAD 12/18/2011  ? Chest pain at rest 12/18/2011  ? Diabetes mellitus   ? DM (diabetes mellitus) ([place]) 12/18/2011  ? Dyslipidemia 12/18/2011  ? 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 History of corneal transplant, lt. eye with blurred vision 12/18/2011  ? HTN (hypertension) 12/18/2011  ? Hypertension   ? Rectal bleeding 03/31/2021  ?  ? ?Family History  ?Problem Relation Age of Onset  ? Heart attack Mother   ? Diabetes Mother   ? Hypertension Father   ? ? ? ?Current Outpatient Medications:  ?  aspirin EC 81 MG tablet, Take 81 mg by mouth daily., Disp: , Rfl:  ?  atorvastatin (LIPITOR) 20 MG tablet, Take 40 mg by mouth once. Pt reports taking 63m once daily., Disp: , Rfl:  ?  cycloSPORINE (SANDIMMUNE) 25 MG capsule, Take 75 mg by mouth 3 (three) times daily., Disp: , Rfl:  ?  folic acid (FOLVITE) 1 MG tablet, Take 1 mg by mouth daily. 4 tabs daily, Disp: , Rfl:  ?  latanoprost (XALATAN) 0.005 % ophthalmic solution, Place 1 drop into both eyes at bedtime., Disp: , Rfl:  ?  LINZESS  290 MCG CAPS capsule, Take 290 mcg by mouth daily., Disp: , Rfl:  ?  lisinopril (ZESTRIL) 10 MG tablet, TAKE 1 TABLET BY MOUTH EVERY DAY, Disp: 90 tablet, Rfl: 1 ?  methotrexate (RHEUMATREX) 2.5 MG tablet, Take 10 mg by mouth once a week. Fridays. Caution:Chemotherapy. Protect from light., Disp: , Rfl:  ?  prednisoLONE acetate (PRED FORTE) 1 % ophthalmic suspension, Place 1 drop into the left eye daily., Disp: , Rfl:  ?  pregabalin (LYRICA) 25 MG capsule, TAKE 1 CAPSULE BY MOUTH TWICE DAILY, Disp: 180 capsule, Rfl: 1 ?  atorvastatin (LIPITOR) 40 MG tablet, Take 1 tablet (40 mg total) by mouth daily., Disp: 90 tablet, Rfl: 3 ?  cycloSPORINE modified (NEORAL) 25 MG capsule, Take 25 mg by mouth 3 (three) times daily. (Patient not taking: Reported on 12/22/2021), Disp: , Rfl:  ?  dorzolamide-timolol (COSOPT) 22.3-6.8 MG/ML ophthalmic solution, Place 1 drop into the left eye 2 (two) times daily. (Patient not taking: Reported on 12/22/2021), Disp: , Rfl:  ?  sildenafil (REVATIO) 20 MG tablet, Take one hour prior to sexual activity (Patient not taking: Reported on 12/22/2021), Disp: 10 tablet, Rfl: 0  ? ?No Known Allergies  ? ?Men's preventive visit. Patient Health Questionnaire (PHQ-2) is  ?Flowsheet Row Clinical Support from 10/14/2021 in [facility]  ?  PHQ-2 Total Score 0  ? ?  ? ?Patient is on a Regular diet. Marital status: Divorced. Relevant history for alcohol use is:  ?Social History  ? ?Substance and Sexual Activity  ?Alcohol Use No  ? Alcohol/week: 0.0 standard drinks  ? ?Relevant history for tobacco use is:  ?Social History  ? ?Tobacco Use  ?Smoking Status Former  ? Packs/day: 0.75  ? Years: 20.00  ? Pack years: 15.00  ? Types: Cigarettes  ? Start date: 10/11/1981  ? Quit date: 10/11/2001  ? Years since quitting: 20.2  ?Smokeless Tobacco Never  ?.  ? ?Review of Systems  ?Constitutional: Negative.   ?HENT: Negative.    ?Eyes: Negative.   ?Respiratory: Negative.    ?Cardiovascular: Negative.    ?Gastrointestinal: Negative.   ?Endocrine: Negative.   ?Genitourinary: Negative.   ?Musculoskeletal: Negative.   ?Skin: Negative.   ?Allergic/Immunologic: Negative.   ?Neurological: Negative.   ?Hematological: Negative.   ?Psychiatric/Behavioral: Negative.     ? ?Today's Vitals  ? 12/22/21 1447  ?BP: 110/78  ?Pulse: 63  ?Temp: (!) 97.5 ?F (36.4 ?C)  ?SpO2: 98%  ?Weight: 167 lb 3.2 oz (75.8 kg)  ?Height: 5' 7" (1.702 m)  ? ?Body mass index is 26.19 kg/m?.  ?Wt Readings from Last 3 Encounters:  ?12/22/21 167 lb 3.2 oz (75.8 kg)  ?10/14/21 168 lb 12.8 oz (76.6 kg)  ?06/23/21 164 lb (74.4 kg)  ?  ?Objective:  ?Physical Exam ?Vitals reviewed.  ?Constitutional:   ?   Appearance: Normal appearance. He is obese.  ?HENT:  ?   Head: Normocephalic and atraumatic.  ?   Right Ear: Tympanic membrane, ear canal and external ear normal. There is no impacted cerumen.  ?   Left Ear: Tympanic membrane, ear canal and external ear normal. There is no impacted cerumen.  ?   Nose:  ?   Comments: Deferred- masked ?   Mouth/Throat:  ?   Comments: Deferred - masked ?Eyes:  ?   Pupils: Pupils are equal, round, and reactive to light.  ?Cardiovascular:  ?   Rate and Rhythm: Normal rate and regular rhythm.  ?   Pulses: Normal pulses.  ?   Heart sounds: Normal heart sounds. No murmur heard. ?Pulmonary:  ?   Effort: Pulmonary effort is normal. No respiratory distress.  ?   Breath sounds: Normal breath sounds. No wheezing.  ?Abdominal:  ?   General: Abdomen is flat. Bowel sounds are normal. There is no distension.  ?   Palpations: Abdomen is soft.  ?   Tenderness: There is no abdominal tenderness.  ?Genitourinary: ?   Prostate: Normal.  ?   Rectum: Guaiac result negative.  ?Musculoskeletal:     ?   General: No swelling or tenderness. Normal range of motion.  ?   Cervical back: Normal range of motion and neck supple.  ?Skin: ?   General: Skin is warm.  ?   Capillary Refill: Capillary refill takes less than 2 seconds.  ?Neurological:  ?   General:  No focal deficit present.  ?   Mental Status: He is alert and oriented to person, place, and time.  ?   Cranial Nerves: No cranial nerve deficit.  ?   Motor: No weakness.  ?Psychiatric:     ?   Mood and Affect: Mood normal.     ?   Behavior: Behavior normal.     ?   Thought Content: Thought content normal.     ?   Judgment:  Judgment normal.  ?  ? ?   ?Assessment And Plan:  ?  ?1. Encounter for general adult medical examination without abnormal findings ?Behavior modifications discussed and diet history reviewed.   ?Pt will continue to exercise regularly and modify diet with low GI, plant based foods and decrease intake of processed foods.  ?Recommend intake of daily multivitamin, Vitamin D, and calcium.  ?Recommend colonoscopy (up to date) for preventive screenings, as well as recommend immunizations that include influenza, TDAP (up to date) ?- CMP14+EGFR ?- Lipid panel ?- CBC ?- Hemoglobin A1c ?- PSA ?- HIV antibody (with reflex) ? ?2. Encounter for HIV (human immunodeficiency virus) test ? ?3. Type 2 diabetes mellitus with diabetic neuropathy, without long-term current use of insulin ([place]) ?Comments: In prediabetes range, no current medications. Diabetes foot exam done, decreased sensation bilaterally. Discussed regular foot care  ? ?4. Atherosclerosis of native coronary artery of native heart without angina pectoris ?Comments: Continue statin ? ?5. Benign hypertensive heart disease without heart failure ?Comments: Blood pressure is well controlled. Continue current medications ? ? ? ? ?Patient was given opportunity to ask questions. Patient verbalized understanding of the plan and was able to repeat key elements of the plan. All questions were answered to their satisfaction.  ? ?Minette Brine, FNP  ? ?I, Minette Brine, FNP, have reviewed all documentation for this visit. The documentation on 12/22/21 for the exam, diagnosis, procedures, and orders are all accurate and complete.  ?THE PATIENT IS ENCOURAGED TO  PRACTICE SOCIAL DISTANCING DUE TO THE COVID-19 PANDEMIC.   ?

## 2021-12-22 NOTE — Patient Instructions (Signed)

## 2021-12-23 LAB — CMP14+EGFR
ALT: 24 IU/L (ref 0–44)
AST: 20 IU/L (ref 0–40)
Albumin/Globulin Ratio: 1.7 (ref 1.2–2.2)
Albumin: 4.7 g/dL (ref 3.8–4.8)
Alkaline Phosphatase: 58 IU/L (ref 44–121)
BUN/Creatinine Ratio: 12 (ref 10–24)
BUN: 15 mg/dL (ref 8–27)
Bilirubin Total: 0.9 mg/dL (ref 0.0–1.2)
CO2: 24 mmol/L (ref 20–29)
Calcium: 9.6 mg/dL (ref 8.6–10.2)
Chloride: 101 mmol/L (ref 96–106)
Creatinine, Ser: 1.21 mg/dL (ref 0.76–1.27)
Globulin, Total: 2.7 g/dL (ref 1.5–4.5)
Glucose: 96 mg/dL (ref 70–99)
Potassium: 4.8 mmol/L (ref 3.5–5.2)
Sodium: 137 mmol/L (ref 134–144)
Total Protein: 7.4 g/dL (ref 6.0–8.5)
eGFR: 67 mL/min/{1.73_m2} (ref >59)

## 2021-12-23 LAB — LIPID PANEL
Chol/HDL Ratio: 2.1 ratio (ref 0.0–5.0)
Cholesterol, Total: 170 mg/dL (ref 100–199)
HDL: 81 mg/dL (ref >39)
LDL Chol Calc (NIH): 79 mg/dL (ref 0–99)
Triglycerides: 47 mg/dL (ref 0–149)
VLDL Cholesterol Cal: 10 mg/dL (ref 5–40)

## 2021-12-23 LAB — CBC
Hematocrit: 41.3 % (ref 37.5–51.0)
Hemoglobin: 14 g/dL (ref 13.0–17.7)
MCH: 31.9 pg (ref 26.6–33.0)
MCHC: 33.9 g/dL (ref 31.5–35.7)
MCV: 94 fL (ref 79–97)
Platelets: 191 10*3/uL (ref 150–450)
RBC: 4.39 x10E6/uL (ref 4.14–5.80)
RDW: 13.6 % (ref 11.6–15.4)
WBC: 3.9 10*3/uL (ref 3.4–10.8)

## 2021-12-23 LAB — HEMOGLOBIN A1C
Est. average glucose Bld gHb Est-mCnc: 126 mg/dL
Hgb A1c MFr Bld: 6 % — ABNORMAL HIGH (ref 4.8–5.6)

## 2021-12-23 LAB — PSA: Prostate Specific Ag, Serum: 0.6 ng/mL (ref 0.0–4.0)

## 2021-12-23 LAB — HIV ANTIBODY (ROUTINE TESTING W REFLEX): HIV Screen 4th Generation wRfx: NONREACTIVE

## 2021-12-24 ENCOUNTER — Telehealth: Payer: Self-pay

## 2021-12-24 NOTE — Telephone Encounter (Signed)
The pt was contacted this morning because I received a message that he tried to contact me at [facility] and the pt said that it wasn't him.   ?

## 2021-12-28 ENCOUNTER — Ambulatory Visit (INDEPENDENT_AMBULATORY_CARE_PROVIDER_SITE_OTHER): Payer: Medicare Other | Admitting: Cardiology

## 2021-12-28 ENCOUNTER — Other Ambulatory Visit: Payer: Self-pay

## 2021-12-28 ENCOUNTER — Encounter: Payer: Self-pay | Admitting: Cardiology

## 2021-12-28 VITALS — BP 188/74 | HR 64 | Ht 67.0 in | Wt 161.0 lb

## 2021-12-28 DIAGNOSIS — I2510 Atherosclerotic heart disease of native coronary artery without angina pectoris: Secondary | ICD-10-CM

## 2021-12-28 DIAGNOSIS — E1140 Type 2 diabetes mellitus with diabetic neuropathy, unspecified: Secondary | ICD-10-CM | POA: Diagnosis not present

## 2021-12-28 DIAGNOSIS — E785 Hyperlipidemia, unspecified: Secondary | ICD-10-CM

## 2021-12-28 DIAGNOSIS — I119 Hypertensive heart disease without heart failure: Secondary | ICD-10-CM | POA: Diagnosis not present

## 2021-12-28 NOTE — Progress Notes (Signed)
?Cardiology Office Note:   ? ?Date:  12/28/2021  ? ?Steven [name], DOB [birth year], MRN [id number] ? ?PCP:  Dorothyann Peng, MD  ?Cardiologist:  Gypsy Balsam, MD   ? ?Referring MD: Dorothyann Peng, MD  ? ?No chief complaint on file. ?I am doing fine ? ?History of Present Illness:   ? ?Steven [name] is a 66 y.o. male with past medical history significant for coronary artery disease.  In 2007 he had cardiac catheterization required stent to left anterior descending artery.  Last evaluation of his coronary artery status was done by stress test last year which was normal.  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 ?He comes today to our office for follow-up.  Overall he seems to be doing well.  He denies have any chest pain tightness squeezing pressure burning chest no palpitations no dizziness. ? ?Past Medical History:  ?Diagnosis Date  ? 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 CAD (coronary artery disease), known LAD stent placed in 2007 in [place] ,last cath 2010 with patent stent and nonobstructive CAD 12/18/2011  ? Chest pain at rest 12/18/2011  ? Diabetes mellitus   ? DM (diabetes mellitus) ([facility]) 12/18/2011  ? Dyslipidemia 12/18/2011  ? 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 History of corneal transplant, lt. eye with blurred vision 12/18/2011  ? HTN (hypertension) 12/18/2011  ? Hypertension   ? Rectal bleeding 03/31/2021  ? ? ?Past Surgical History:  ?Procedure Laterality Date  ? CARDIAC SURGERY    ? stent placed in 2007  ? CORNEAL TRANSPLANT    ? CORONARY ANGIOPLASTY  2008  ? STENT PLACED IN LAD.MILD DIFFUSE IN-STENT RESTENOSIS IN 2010.  ? CORONARY STENT PLACEMENT    ? MYOCARDIAL PERFUSION STUDY  01/19/13  ? NORMAL STRESS NUCLEAR STUDY.  ? TRANSTHORACIC ECHOCARDIOGRAM  07/28/2009  ? LV= MILD LVH. EF 60% TO 65%.  ? VASCULAR US  01/15/09   ? NORMAL LOWER ARTERIAL DOPPLER  ? ? ?Current Medications: ?Current Meds  ?Medication Sig  ? aspirin EC 81 MG tablet Take 81 mg by mouth daily.  ? atorvastatin (LIPITOR) 20 MG tablet Take 40 mg by mouth once. Pt reports taking 40mg  once daily.  ? atorvastatin (LIPITOR) 40 MG tablet Take 1 tablet (40 mg total) by mouth daily.  ? cycloSPORINE (SANDIMMUNE) 25 MG capsule Take 75 mg by mouth 3 (three) times daily.  ? cycloSPORINE modified (NEORAL) 25 MG capsule Take 25 mg by mouth 3 (three) times daily.  ? dorzolamide-timolol (COSOPT) 22.3-6.8 MG/ML ophthalmic solution Place 1 drop into the left eye 2 (two) times daily.  ? folic acid (FOLVITE) 1 MG tablet Take 1 mg by mouth daily. 4 tabs daily  ? latanoprost (XALATAN) 0.005 % ophthalmic solution Place 1 drop into both eyes at bedtime.  ? LINZESS 290 MCG CAPS capsule Take 290 mcg by mouth daily.  ? lisinopril (ZESTRIL) 10 MG tablet TAKE 1 TABLET BY MOUTH EVERY DAY  ? methotrexate (RHEUMATREX) 2.5 MG tablet Take 10 mg by mouth once a week. Fridays. Caution:Chemotherapy. Protect from light.  ? prednisoLONE acetate (PRED FORTE) 1 % ophthalmic suspension Place 1 drop into the left eye daily.  ? pregabalin (LYRICA) 25 MG capsule TAKE 1 CAPSULE BY MOUTH TWICE DAILY  ? sildenafil (REVATIO) 20 MG tablet Take one hour prior to sexual activity  ?  ? ?  Allergies:   Patient has no known allergies.  ? ?Social History  ? ?Socioeconomic History  ? Marital status: Divorced  ?  Spouse name: Not on file  ? Number of children: Not on file  ? Years of education: Not on file  ? Highest education level: Not on file  ?Occupational History  ? Occupation: disability  ?Tobacco Use  ? Smoking status: Former  ?  Packs/day: 0.75  ?  Years: 20.00  ?  Pack years: 15.00  ?  Types: Cigarettes  ?  Start date: 10/11/1981  ?  Quit date: 10/11/2001  ?  Years since quitting: 20.2  ? Smokeless tobacco: Never  ?Vaping Use  ? Vaping Use: Never used  ?Substance and Sexual Activity  ? Alcohol use: No  ?  Alcohol/week:  0.0 standard drinks  ? Drug use: Not Currently  ? Sexual activity: Yes  ?Other Topics Concern  ? Not on file  ?Social History Narrative  ? Not on file  ? ?Social Determinants of Health  ? ?Financial Resource Strain: Low Risk   ? Difficulty of Paying Living Expenses: Not hard at all  ?Food Insecurity: No Food Insecurity  ? Worried About Programme researcher, broadcasting/film/videounning Out of Food in the Last Year: Never true  ? Ran Out of Food in the Last Year: Never true  ?Transportation Needs: No Transportation Needs  ? Lack of Transportation (Medical): No  ? Lack of Transportation (Non-Medical): No  ?Physical Activity: Sufficiently Active  ? Days of Exercise per Week: 7 days  ? Minutes of Exercise per Session: 30 min  ?Stress: No Stress Concern Present  ? Feeling of Stress : Not at all  ?Social Connections: Not on file  ?  ? ?Family History: ?The patient's family history includes Diabetes in his mother; Heart attack in his mother; Hypertension in his father. ?ROS:   ?Please see the history of present illness.    ?All 14 point review of systems negative except as described per history of present illness ? ?EKGs/Labs/Other Studies Reviewed:   ? ? ? ?Recent Labs: ?03/02/2021: TSH 1.090 ?03/24/2021: Magnesium 2.1 ?12/22/2021: ALT 24; BUN 15; Creatinine, Ser 1.21; Hemoglobin 14.0; Platelets 191; Potassium 4.8; Sodium 137  ?Recent Lipid Panel ?   ?Component Value Date/Time  ? CHOL 170 12/22/2021 1524  ? TRIG 47 12/22/2021 1524  ? HDL 81 12/22/2021 1524  ? CHOLHDL 2.1 12/22/2021 1524  ? CHOLHDL 2.6 12/19/2011 0138  ? VLDL 12 12/19/2011 0138  ? LDLCALC 79 12/22/2021 1524  ? ? ?Physical Exam:   ? ?VS:  BP (!) 188/74 (BP Location: Left Arm)   Pulse 64   Ht 5\' 7"  (1.702 m)   Wt 161 lb (73 kg)   SpO2 98%   BMI 25.22 kg/m?    ? ?Wt Readings from Last 3 Encounters:  ?12/28/21 161 lb (73 kg)  ?12/22/21 167 lb 3.2 oz (75.8 kg)  ?10/14/21 168 lb 12.8 oz (76.6 kg)  ?  ? ?GEN:  Well nourished, well developed in no acute distress ?HEENT: Normal ?NECK: No JVD; No carotid  bruits ?LYMPHATICS: No lymphadenopathy ?CARDIAC: RRR, no murmurs, no rubs, no gallops ?RESPIRATORY:  Clear to auscultation without rales, wheezing or rhonchi  ?ABDOMEN: Soft, non-tender, non-distended ?MUSCULOSKELETAL:  No edema; No deformity  ?SKIN: Warm and dry ?LOWER EXTREMITIES: no swelling ?NEUROLOGIC:  Alert and oriented x 3 ?PSYCHIATRIC:  Normal affect  ? ?ASSESSMENT:   ? ?1. Coronary artery disease involving native coronary artery of native heart without angina pectoris   ?  2. Benign hypertensive heart disease without heart failure   ?3. Type 2 diabetes mellitus with diabetic neuropathy, without long-term current use of insulin ([facility])   ?4. Dyslipidemia   ? ?PLAN:   ? ?In order of problems listed above: ? ?Coronary disease stable from that point review stress test reviewed with the patient negative.  Stress test showed diminished left ventricle ejection fraction 48% however after that he got echocardiogram done which showed normal left ventricle ejection fraction. ?Essential hypertension: Blood pressure well controlled 118/74 today we will continue present management. ?Dyslipidemia: He is on Lipitor 40 I do have K PN showing me his cholesterol LDL 108 HDL 75 this is from May 2022.  We will repeat his fasting lipid profile today since it was not controlled. ?Type 2 diabetes, his hemoglobin A1c from May of last year was 6.2.  Controlled with diet alone. ? ? ?Medication Adjustments/Labs and Tests Ordered: ?Current medicines are reviewed at length with the patient today.  Concerns regarding medicines are outlined above.  ?No orders of the defined types were placed in this encounter. ? ?Medication changes: No orders of the defined types were placed in this encounter. ? ? ?Signed, ?Georgeanna Lea, MD, [facility] ?12/28/2021 10:16 AM    ?[facility] ?

## 2021-12-28 NOTE — Addendum Note (Signed)
Addended by: Roxanne Mins I on: 12/28/2021 10:24 AM ? ? Modules accepted: Orders ? ?

## 2021-12-28 NOTE — Patient Instructions (Signed)
Medication Instructions:  ?Your physician recommends that you continue on your current medications as directed. Please refer to the Current Medication list given to you today. ? ?*If you need a refill on your cardiac medications before your next appointment, please call your pharmacy* ? ? ?Lab Work: ?Your physician recommends that you return for lab work in:  ? ?Labs today: Lipids ? ?If you have labs (blood work) drawn today and your tests are completely normal, you will receive your results only by: ?MyChart Message (if you have MyChart) OR ?A paper copy in the mail ?If you have any lab test that is abnormal or we need to change your treatment, we will call you to review the results. ? ? ?Testing/Procedures: ?None ? ? ?Follow-Up: ?At [facility], you and your health needs are our priority.  As part of our continuing mission to provide you with exceptional heart care, we have created designated Provider Care Teams.  These Care Teams include your primary Cardiologist (physician) and Advanced Practice Providers (APPs -  Physician Assistants and Nurse Practitioners) who all work together to provide you with the care you need, when you need it. ? ?We recommend signing up for the patient portal called "MyChart".  Sign up information is provided on this After Visit Summary.  MyChart is used to connect with patients for Virtual Visits (Telemedicine).  Patients are able to view lab/test results, encounter notes, upcoming appointments, etc.  Non-urgent messages can be sent to your provider as well.   ?To learn more about what you can do with MyChart, go to [email or web address].   ? ?Your next appointment:   ?6 month(s) ? ?The format for your next appointment:   ?In Person ? ?Provider:   ?Gypsy Balsam, MD  ? ? ?Other Instructions ?None ? ?

## 2021-12-29 LAB — LIPID PANEL
Chol/HDL Ratio: 2.1 ratio (ref 0.0–5.0)
Cholesterol, Total: 164 mg/dL (ref 100–199)
HDL: 77 mg/dL (ref >39)
LDL Chol Calc (NIH): 76 mg/dL (ref 0–99)
Triglycerides: 52 mg/dL (ref 0–149)
VLDL Cholesterol Cal: 11 mg/dL (ref 5–40)

## 2022-01-06 ENCOUNTER — Telehealth: Payer: Self-pay | Admitting: Cardiology

## 2022-01-06 NOTE — Telephone Encounter (Signed)
Patient informed of results.  

## 2022-01-06 NOTE — Telephone Encounter (Signed)
Pt returning a call for results... please advise  ?

## 2022-02-18 DIAGNOSIS — Z79899 Other long term (current) drug therapy: Secondary | ICD-10-CM | POA: Diagnosis not present

## 2022-02-18 DIAGNOSIS — H3581 Retinal edema: Secondary | ICD-10-CM | POA: Diagnosis not present

## 2022-02-18 DIAGNOSIS — H30033 Focal chorioretinal inflammation, peripheral, bilateral: Secondary | ICD-10-CM | POA: Diagnosis not present

## 2022-02-23 DIAGNOSIS — H30033 Focal chorioretinal inflammation, peripheral, bilateral: Secondary | ICD-10-CM | POA: Diagnosis not present

## 2022-02-23 DIAGNOSIS — H3581 Retinal edema: Secondary | ICD-10-CM | POA: Diagnosis not present

## 2022-02-23 DIAGNOSIS — H44521 Atrophy of globe, right eye: Secondary | ICD-10-CM | POA: Diagnosis not present

## 2022-02-23 DIAGNOSIS — I10 Essential (primary) hypertension: Secondary | ICD-10-CM | POA: Diagnosis not present

## 2022-02-23 DIAGNOSIS — H402224 Chronic angle-closure glaucoma, left eye, indeterminate stage: Secondary | ICD-10-CM | POA: Diagnosis not present

## 2022-02-23 DIAGNOSIS — H4042X2 Glaucoma secondary to eye inflammation, left eye, moderate stage: Secondary | ICD-10-CM | POA: Diagnosis not present

## 2022-02-23 DIAGNOSIS — H209 Unspecified iridocyclitis: Secondary | ICD-10-CM | POA: Diagnosis not present

## 2022-02-23 DIAGNOSIS — Z947 Corneal transplant status: Secondary | ICD-10-CM | POA: Diagnosis not present

## 2022-02-23 DIAGNOSIS — H35033 Hypertensive retinopathy, bilateral: Secondary | ICD-10-CM | POA: Diagnosis not present

## 2022-02-23 DIAGNOSIS — Z79899 Other long term (current) drug therapy: Secondary | ICD-10-CM | POA: Diagnosis not present

## 2022-02-24 DIAGNOSIS — H18421 Band keratopathy, right eye: Secondary | ICD-10-CM | POA: Diagnosis not present

## 2022-02-24 DIAGNOSIS — H30033 Focal chorioretinal inflammation, peripheral, bilateral: Secondary | ICD-10-CM | POA: Diagnosis not present

## 2022-02-24 DIAGNOSIS — H209 Unspecified iridocyclitis: Secondary | ICD-10-CM | POA: Diagnosis not present

## 2022-02-24 DIAGNOSIS — H4042X2 Glaucoma secondary to eye inflammation, left eye, moderate stage: Secondary | ICD-10-CM | POA: Diagnosis not present

## 2022-02-24 DIAGNOSIS — Z961 Presence of intraocular lens: Secondary | ICD-10-CM | POA: Diagnosis not present

## 2022-02-24 DIAGNOSIS — Z9889 Other specified postprocedural states: Secondary | ICD-10-CM | POA: Diagnosis not present

## 2022-02-24 LAB — HM DIABETES EYE EXAM

## 2022-02-25 ENCOUNTER — Other Ambulatory Visit: Payer: Self-pay | Admitting: Internal Medicine

## 2022-02-26 ENCOUNTER — Other Ambulatory Visit: Payer: Self-pay | Admitting: Internal Medicine

## 2022-04-08 ENCOUNTER — Other Ambulatory Visit: Payer: Self-pay | Admitting: Internal Medicine

## 2022-05-06 ENCOUNTER — Encounter: Payer: Self-pay | Admitting: Internal Medicine

## 2022-05-06 ENCOUNTER — Ambulatory Visit (INDEPENDENT_AMBULATORY_CARE_PROVIDER_SITE_OTHER): Payer: Medicare Other | Admitting: Internal Medicine

## 2022-05-06 VITALS — BP 114/80 | HR 61 | Temp 98.3°F | Ht 66.2 in | Wt 158.8 lb

## 2022-05-06 DIAGNOSIS — Z2821 Immunization not carried out because of patient refusal: Secondary | ICD-10-CM

## 2022-05-06 DIAGNOSIS — I10 Essential (primary) hypertension: Secondary | ICD-10-CM | POA: Diagnosis not present

## 2022-05-06 DIAGNOSIS — D849 Immunodeficiency, unspecified: Secondary | ICD-10-CM

## 2022-05-06 DIAGNOSIS — I2510 Atherosclerotic heart disease of native coronary artery without angina pectoris: Secondary | ICD-10-CM | POA: Diagnosis not present

## 2022-05-06 DIAGNOSIS — Z1211 Encounter for screening for malignant neoplasm of colon: Secondary | ICD-10-CM | POA: Diagnosis not present

## 2022-05-06 DIAGNOSIS — E119 Type 2 diabetes mellitus without complications: Secondary | ICD-10-CM | POA: Diagnosis not present

## 2022-05-06 DIAGNOSIS — I119 Hypertensive heart disease without heart failure: Secondary | ICD-10-CM

## 2022-05-06 DIAGNOSIS — E1140 Type 2 diabetes mellitus with diabetic neuropathy, unspecified: Secondary | ICD-10-CM | POA: Diagnosis not present

## 2022-05-06 DIAGNOSIS — K5904 Chronic idiopathic constipation: Secondary | ICD-10-CM | POA: Diagnosis not present

## 2022-05-06 DIAGNOSIS — E782 Mixed hyperlipidemia: Secondary | ICD-10-CM | POA: Diagnosis not present

## 2022-05-06 DIAGNOSIS — Z6825 Body mass index (BMI) 25.0-25.9, adult: Secondary | ICD-10-CM

## 2022-05-06 LAB — POCT URINALYSIS DIPSTICK
Bilirubin, UA: NEGATIVE
Blood, UA: NEGATIVE
Glucose, UA: NEGATIVE
Ketones, UA: NEGATIVE
Leukocytes, UA: NEGATIVE
Nitrite, UA: NEGATIVE
Protein, UA: NEGATIVE
Spec Grav, UA: 1.02 (ref 1.010–1.025)
Urobilinogen, UA: 0.2 E.U./dL
pH, UA: 5 (ref 5.0–8.0)

## 2022-05-06 MED ORDER — ATORVASTATIN CALCIUM 40 MG PO TABS
40.0000 mg | ORAL_TABLET | Freq: Every day | ORAL | 3 refills | Status: DC
Start: 2022-05-06 — End: 2023-01-24

## 2022-05-06 NOTE — Progress Notes (Signed)
Rich Brave [place],acting as a Education administrator for [name], MD.,have documented all relevant documentation on the behalf of [name], MD,as directed by  [name], MD while in the presence of [name], MD.    Subjective:     Patient ID: Steven [name] , male    DOB: 07-30-1956 , 66 y.o.   MRN: [id number]   Chief Complaint  Patient presents with   Diabetes   Hypertension    HPI  Patient presents today for a diabetes and HTN check. He reports compliance with his meds. He denies headaches, chest pain and shortness of breath. He has regular eye exams, he is up to date. No new concerns.   Diabetes He presents for his follow-up diabetic visit. He has type 2 diabetes mellitus. Pertinent negatives for diabetes include no blurred vision, no chest pain, no polydipsia, no polyphagia and no polyuria. There are no hypoglycemic complications. Risk factors for coronary artery disease include diabetes mellitus, dyslipidemia, male sex and hypertension. Current diabetic treatment includes diet. His weight is stable. He is following a diabetic diet. He participates in exercise daily. He does not see a podiatrist.Eye exam is current.  Hypertension This is a chronic problem. The current episode started more than 1 year ago. Pertinent negatives include no blurred vision or chest pain. Risk factors for coronary artery disease include male gender, diabetes mellitus and dyslipidemia. Hypertensive end-organ damage includes CAD/MI.     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 eye with blurred vision 12/18/2011   HTN (hypertension) 12/18/2011    Hypertension    Rectal bleeding 03/31/2021     Family History  Problem Relation Age of Onset   Heart attack Mother    Diabetes Mother    Hypertension Father      Current Outpatient Medications:    aspirin EC 81 MG tablet, Take 81 mg by mouth daily., Disp: , Rfl:    cycloSPORINE modified (NEORAL) 25 MG capsule, Take 25 mg by mouth 3 (three) times daily., Disp: , Rfl:    dorzolamide-timolol (COSOPT) 22.3-6.8 MG/ML ophthalmic solution, Place 1 drop into the left eye 2 (two) times daily., Disp: , Rfl:    folic acid (FOLVITE) 1 MG tablet, Take 1 mg by mouth daily. 4 tabs daily, Disp: , Rfl:    latanoprost (XALATAN) 0.005 % ophthalmic solution, Place 1 drop into both eyes at bedtime., Disp: , Rfl:    LINZESS 290 MCG CAPS capsule, Take 290 mcg by mouth daily., Disp: , Rfl:    lisinopril (ZESTRIL) 10 MG tablet, TAKE 1 TABLET BY MOUTH EVERY DAY, Disp: 90 tablet, Rfl: 1   methotrexate (RHEUMATREX) 2.5 MG tablet, Take 10 mg by mouth once a week. Fridays. Caution:Chemotherapy. Protect from light., Disp: , Rfl:    prednisoLONE acetate (PRED FORTE) 1 % ophthalmic suspension, Place 1 drop into the left eye daily., Disp: , Rfl:    sildenafil (REVATIO) 20 MG tablet, Take one hour prior to sexual activity, Disp: 10 tablet, Rfl: 0   atorvastatin (LIPITOR) 40 MG tablet, Take 1 tablet (40 mg total) by mouth daily., Disp:  90 tablet, Rfl: 3   No Known Allergies   Review of Systems  Constitutional: Negative.   Eyes: Negative.  Negative for blurred vision.  Respiratory: Negative.    Cardiovascular: Negative.  Negative for chest pain.  Gastrointestinal: Negative.   Endocrine: Negative for polydipsia, polyphagia and polyuria.  Musculoskeletal: Negative.   Skin: Negative.   Neurological: Negative.   Psychiatric/Behavioral: Negative.       Today's Vitals   05/06/22 0856  BP: 114/80  Pulse: 61  Temp: 98.3 F (36.8 C)  Weight: 158 lb 12.8 oz (72 kg)  Height: 5' 6.2" (1.681 m)  PainSc: 0-No pain    Body mass index is 25.48 kg/m.  Wt Readings from Last 3 Encounters:  05/06/22 158 lb 12.8 oz (72 kg)  12/28/21 161 lb (73 kg)  12/22/21 167 lb 3.2 oz (75.8 kg)    Objective:  Physical Exam Vitals and nursing note reviewed.  Constitutional:      Appearance: Normal appearance.  HENT:     Head: Normocephalic and atraumatic.  Eyes:     Extraocular Movements: Extraocular movements intact.  Cardiovascular:     Rate and Rhythm: Normal rate and regular rhythm.     Heart sounds: Normal heart sounds.  Pulmonary:     Effort: Pulmonary effort is normal.     Breath sounds: Normal breath sounds.  Musculoskeletal:     Cervical back: Normal range of motion.  Skin:    General: Skin is warm.  Neurological:     General: No focal deficit present.     Mental Status: He is alert.  Psychiatric:        Mood and Affect: Mood normal.         Assessment And Plan:     1. Type 2 diabetes mellitus with diabetic neuropathy, without long-term current use of insulin (HCC) Comments: Chronic, well controlled with dietary changes alone. He is followed by [facility] for his eye exams. I will abstract report into EPIC. F/u 4 months.  - Urine microalbumin-creatinine with uACR - Hemoglobin A1c - BMP8+eGFR - POCT Urinalysis Dipstick (81002)  2. Benign hypertensive heart disease without heart failure Comments: Chronic, well controlled.  He will c/w lisinopril $RemoveBefore'10mg'[id number]$  daily.   3. Atherosclerosis of native coronary artery of native heart without angina pectoris Comments: Chronic, LDL goal is <70. He is encouraged to follow a heart healthy lifestyle.   4. 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 Importance of compliance w/ folic acid was d/w patient.  5. BMI 25.0-25.9,adult Comments: He is encouraged to aim for at least 150 minutes of exercise per week.   6. Herpes zoster vaccination declined  7. COVID-19 vaccination declined    Patient was given opportunity to ask questions. Patient verbalized understanding of the plan and was able to repeat key elements of the plan. All questions were answered to their satisfaction.   I, [name], MD, have reviewed all documentation for this visit. The documentation on 05/06/22 for the exam, diagnosis, procedures, and orders are all accurate and complete.   IF YOU HAVE BEEN REFERRED TO A SPECIALIST, IT MAY TAKE 1-2 WEEKS TO SCHEDULE/PROCESS THE REFERRAL. IF YOU HAVE NOT HEARD FROM US/SPECIALIST IN TWO WEEKS, PLEASE GIVE [place] A CALL AT [phone].   THE PATIENT IS ENCOURAGED TO PRACTICE SOCIAL DISTANCING DUE TO THE COVID-19 PANDEMIC.

## 2022-05-06 NOTE — Patient Instructions (Signed)

## 2022-05-07 DIAGNOSIS — Z6825 Body mass index (BMI) 25.0-25.9, adult: Secondary | ICD-10-CM | POA: Insufficient documentation

## 2022-05-07 LAB — BMP8+EGFR
BUN/Creatinine Ratio: 8 — ABNORMAL LOW (ref 10–24)
BUN: 8 mg/dL (ref 8–27)
CO2: 23 mmol/L (ref 20–29)
Calcium: 9.4 mg/dL (ref 8.6–10.2)
Chloride: 101 mmol/L (ref 96–106)
Creatinine, Ser: 1.04 mg/dL (ref 0.76–1.27)
Glucose: 111 mg/dL — ABNORMAL HIGH (ref 70–99)
Potassium: 4.1 mmol/L (ref 3.5–5.2)
Sodium: 137 mmol/L (ref 134–144)
eGFR: 80 mL/min/{1.73_m2} (ref >59)

## 2022-05-07 LAB — HEMOGLOBIN A1C
Est. average glucose Bld gHb Est-mCnc: 123 mg/dL
Hgb A1c MFr Bld: 5.9 % — ABNORMAL HIGH (ref 4.8–5.6)

## 2022-05-07 LAB — MICROALBUMIN / CREATININE URINE RATIO
Creatinine, Urine: 149.3 mg/dL
Microalb/Creat Ratio: <2 mg/g creat (ref 0–29)
Microalbumin, Urine: <3 ug/mL

## 2022-06-16 ENCOUNTER — Telehealth: Payer: Self-pay

## 2022-06-16 NOTE — Telephone Encounter (Signed)
Primary Cardiologist:Robert Bing Matter, MD   Preoperative team, please contact this patient and set up a phone call appointment for further preoperative risk assessment. Please obtain consent and complete medication review. Thank you for your help.   Ideally aspirin should be continued without interruption, however if the bleeding risk is too great, aspirin may be held for 7 days prior to surgery. Please resume aspirin post operatively when it is felt to be safe from a bleeding standpoint.    Levi Aland, NP-C     06/16/2022, 1:53 PM [address] Office [phone] Fax [phone]

## 2022-06-16 NOTE — Telephone Encounter (Signed)
Left message for the pt to call back for tele pre op appt. Recommendations to hold ASA x 7 days means pt will need tele this week in order to begin holding on 06/21/22

## 2022-06-16 NOTE — Telephone Encounter (Signed)
    [facility] Pre-operative Risk Assessment    Request for surgical clearance:  What type of surgery is being performed? Colonoscopy    When is this surgery scheduled? 06/28/2022   What type of clearance is required (medical clearance vs. Pharmacy clearance to hold med vs. Both)? Both  Are there any medications that need to be held prior to surgery and how long?Not specified   Practice name and name of physician performing surgery? Dr. Benson Norway at [facility]    What is your office phone number: [phone]    7.   What is your office fax number: [phone]  8.   Anesthesia type (None, local, MAC, general) ? Propofol   Steven [name] 06/16/2022, 1:33 PM  _________________________________________________________________   (provider comments below)

## 2022-06-17 ENCOUNTER — Telehealth: Payer: Self-pay | Admitting: Cardiology

## 2022-06-17 ENCOUNTER — Telehealth: Payer: Self-pay | Admitting: *Deleted

## 2022-06-17 NOTE — Telephone Encounter (Signed)
I will update the requesting office that we are trying to reach the pt to set up a tele appt for pre op. Pt did call back today but we have not been able to reach him again. In order to hold ASA as requested, pt would need tele visit today.

## 2022-06-17 NOTE — Telephone Encounter (Signed)
Operator sent secure chat back to pre op call back the pt is calling back. I d/w pre op provider as to pt is now just calling back. Per pre op provider to schedule pt for 06/18/22 @ 3:20.   Operator informed the pt for [place] and he is agreeable to plan of care and time and date. He is fine with this and the number is: [phone]  Med rec not done and will need to be done with tele appt. Consent has been given as pt has given as he is agreeable to appt.      Patient Consent for Virtual Visit        [name] has provided verbal consent on 06/17/2022 for a virtual visit (video or telephone).   CONSENT FOR VIRTUAL VISIT FOR:  Steven [name]  By participating in this virtual visit I agree to the following:  I hereby voluntarily request, consent and authorize [facility] and its employed or contracted physicians, physician assistants, nurse practitioners or other licensed health care professionals (the Practitioner), to provide me with telemedicine health care services (the "Services") as deemed necessary by the treating Practitioner. I acknowledge and consent to receive the Services by the Practitioner via telemedicine. I understand that the telemedicine visit will involve communicating with the Practitioner through live audiovisual communication technology and the disclosure of certain medical information by electronic transmission. I acknowledge that I have been given the opportunity to request an in-person assessment or other available alternative prior to the telemedicine visit and am voluntarily participating in the telemedicine visit.  I understand that I have the right to withhold or withdraw my consent to the use of telemedicine in the course of my care at any time, without affecting my right to future care or treatment, and that the Practitioner or I may terminate the telemedicine visit at any time. I understand that I have the right to inspect all information obtained and/or  recorded in the course of the telemedicine visit and may receive copies of available information for a reasonable fee.  I understand that some of the potential risks of receiving the Services via telemedicine include:  Delay or interruption in medical evaluation due to technological equipment failure or disruption; Information transmitted may not be sufficient (e.g. poor resolution of images) to allow for appropriate medical decision making by the Practitioner; and/or  In rare instances, security protocols could fail, causing a breach of personal health information.  Furthermore, I acknowledge that it is my responsibility to provide information about my medical history, conditions and care that is complete and accurate to the best of my ability. I acknowledge that Practitioner's advice, recommendations, and/or decision may be based on factors not within their control, such as incomplete or inaccurate data provided by me or distortions of diagnostic images or specimens that may result from electronic transmissions. I understand that the practice of medicine is not an exact science and that Practitioner makes no warranties or guarantees regarding treatment outcomes. I acknowledge that a copy of this consent can be made available to me via my patient portal [facility] MyChart), or I can request a printed copy by calling the office of [facility].    I understand that my insurance will be billed for this visit.   I have read or had this consent read to me. I understand the contents of this consent, which adequately explains the benefits and risks of the Services being provided via telemedicine.  I have been  provided ample opportunity to ask questions regarding this consent and the Services and have had my questions answered to my satisfaction. I give my informed consent for the services to be provided through the use of telemedicine in my medical care

## 2022-06-17 NOTE — Telephone Encounter (Signed)
See notes from Eligha Bridegroom, NP in regard to if ASA needs to be held.

## 2022-06-17 NOTE — Telephone Encounter (Signed)
Pt returned call. I spoke with Eulah Pont, and Julian Hy (pre-op team) through secure chat. They agreed to a tele appt at 3:20 P.M. 09/08 at number: [phone] with patient.

## 2022-06-17 NOTE — Telephone Encounter (Signed)
Lorie Phenix 3 minutes ago (12:49 PM)   YO Patient returned call.       Note    Arshdeep, [place] [phone]  Lorie Phenix

## 2022-06-17 NOTE — Telephone Encounter (Signed)
Left message to call back for tele pre op appt to be today

## 2022-06-17 NOTE — Telephone Encounter (Signed)
Operator sent secure chat back to pre op call back the pt is calling back. I d/w pre op provider as to pt is now just calling back. Per pre op provider to schedule pt for 06/18/22 @ 3:20.   Operator informed the pt for [place] and he is agreeable to plan of care and time and date. He is fine with this and the number is: [phone]  Med rec not done and will need to be done with tele appt. Consent has been given as pt has given as he is agreeable to appt.

## 2022-06-17 NOTE — Telephone Encounter (Signed)
Left message x 2 for the pt to call back for tele pre op appt. Due to ASA hold time pt will need tele tomorrow to be advised when to begin holding ASA. I will update the requesting office as well that we have left message x 2 for call back from the pt.

## 2022-06-17 NOTE — Telephone Encounter (Signed)
Patient returned call

## 2022-06-18 ENCOUNTER — Ambulatory Visit: Payer: Medicare Other | Attending: Cardiovascular Disease | Admitting: Physician Assistant

## 2022-06-18 DIAGNOSIS — Z01810 Encounter for preprocedural cardiovascular examination: Secondary | ICD-10-CM

## 2022-06-18 NOTE — Progress Notes (Signed)
Virtual Visit via Telephone Note   Because of Steven [name]'s co-morbid illnesses, he is at least at moderate risk for complications without adequate follow up.  This format is felt to be most appropriate for this patient at this time.  The patient did not have access to video technology/had technical difficulties with video requiring transitioning to audio format only (telephone).  All issues noted in this document were discussed and addressed.  No physical exam could be performed with this format.  Please refer to the patient's chart for his consent to telehealth for [facility].  Evaluation Performed:  Preoperative cardiovascular risk assessment _____________   Date:  06/18/2022   Patient ID:  Steven [name], DOB 1955-10-20, MRN [id number] Patient Location:  Home Provider location:   Office  Primary Care Provider:  Dorothyann Peng, MD Primary Cardiologist:  Gypsy Balsam, MD  Chief Complaint / Patient Profile   66 y.o. y/o male with a h/o legal blindness, CAD with LAD stent placed in 2007, hypertension, diabetes mellitus, hyperlipidemia, history of rectal bleeding who is pending colonoscopy and presents today for telephonic preoperative cardiovascular risk assessment.  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 eye with blurred vision 12/18/2011   HTN (hypertension) 12/18/2011   Hypertension    Rectal bleeding 03/31/2021   Past Surgical History:  Procedure Laterality Date   CARDIAC SURGERY     stent placed in 2007   CORNEAL TRANSPLANT     CORONARY ANGIOPLASTY  2008   STENT PLACED IN  LAD.MILD DIFFUSE IN-STENT RESTENOSIS IN 2010.   CORONARY STENT PLACEMENT     MYOCARDIAL PERFUSION STUDY  01/19/13   NORMAL STRESS NUCLEAR STUDY.   TRANSTHORACIC ECHOCARDIOGRAM  07/28/2009   LV= MILD LVH. EF 60% TO 65%.   VASCULAR US  01/15/09   NORMAL LOWER ARTERIAL DOPPLER    Allergies  No Known Allergies  History of Present Illness    Steven [name] is a 66 y.o. male who presents via audio/video conferencing for a telehealth visit today.  Pt was last seen in cardiology clinic on 12/28/2021 by Dr. Bing Matter.  At that time Steven [name] was doing well.  The patient is now pending procedure as outlined above. Since his last visit, he has had no cardiac issues.  He denies chest pain, shortness of breath, and palpitations.  He walks every day for about 35 minutes a day outside.  He has no trouble with stairs or doing work around the house.  For this reason he is scored a 5.07 METS on the DASI.  This exceeds the minimum requirement of 4 METS.  Aspirin should be continued without interruption, however if bleeding risk is too great, aspirin may be held for 7 days prior to procedure.  Please resume aspirin postoperatively once safe to do so.    Home Medications    Prior to Admission medications   Medication Sig Start Date End Date Taking? Authorizing Provider  aspirin EC 81 MG tablet Take 81 mg by mouth daily.    [provider]  atorvastatin (LIPITOR) 40 MG  tablet Take 1 tablet (40 mg total) by mouth daily. 05/06/22 05/06/23  Dorothyann Peng, MD  cycloSPORINE modified (NEORAL) 25 MG capsule Take 25 mg by mouth 3 (three) times daily. 04/21/21   [provider]  dorzolamide-timolol (COSOPT) 22.3-6.8 MG/ML ophthalmic solution Place 1 drop into the left eye 2 (two) times daily. 07/03/19   [provider]  folic acid (FOLVITE) 1 MG tablet Take 1 mg by mouth daily. 4 tabs daily    [provider]  latanoprost (XALATAN) 0.005 % ophthalmic solution Place 1 drop into  both eyes at bedtime. 07/03/19   [provider]  LINZESS 290 MCG CAPS capsule Take 290 mcg by mouth daily. 12/10/13   [provider]  lisinopril (ZESTRIL) 10 MG tablet TAKE 1 TABLET BY MOUTH EVERY DAY 04/08/22   Dorothyann Peng, MD  methotrexate (RHEUMATREX) 2.5 MG tablet Take 10 mg by mouth once a week. Fridays. Caution:Chemotherapy. Protect from light.    [provider]  prednisoLONE acetate (PRED FORTE) 1 % ophthalmic suspension Place 1 drop into the left eye daily.    [provider]  sildenafil (REVATIO) 20 MG tablet Take one hour prior to sexual activity 10/14/21   Dorothyann Peng, MD    Physical Exam    Vital Signs:  Steven [name] does not have vital signs available for review today.  Given telephonic nature of communication, physical exam is limited. AAOx3. NAD. Normal affect.  Speech and respirations are unlabored.  Accessory Clinical Findings    None  Assessment & Plan    1.  Preoperative Cardiovascular Risk Assessment:   Steven [name] perioperative risk of a major cardiac event is 0.9% according to the Revised Cardiac Risk Index (RCRI).  Therefore, he is at low risk for perioperative complications.   His functional capacity is good at 5.07 METs according to the [place] Activity Status Index (DASI). Recommendations: According to ACC/AHA guidelines, no further cardiovascular testing needed.  The patient may proceed to surgery at acceptable risk.   Antiplatelet and/or Anticoagulation Recommendations: Aspirin can be held for 7 days prior to his surgery.  Please resume Aspirin post operatively when it is felt to be safe from a bleeding standpoint.   A copy of this note will be routed to requesting surgeon.  Time:   Today, I have spent 7 minutes with the patient with telehealth technology discussing medical history, symptoms, and management plan.     Sharlene Dory, PA-C  06/18/2022, 2:49 PM

## 2022-06-28 DIAGNOSIS — Z1211 Encounter for screening for malignant neoplasm of colon: Secondary | ICD-10-CM | POA: Diagnosis not present

## 2022-06-28 LAB — HM COLONOSCOPY

## 2022-07-01 ENCOUNTER — Encounter: Payer: Self-pay | Admitting: Internal Medicine

## 2022-07-03 IMAGING — CR DG CHEST 2V
2 series · 2 of 2 positions shown · non-contrast
Comparison: 09/02/2015

CLINICAL DATA: Chest pain, dizziness, near syncopal event, sharp
LEFT side chest pain

EXAM:
CHEST - 2 VIEW

[chest pa]
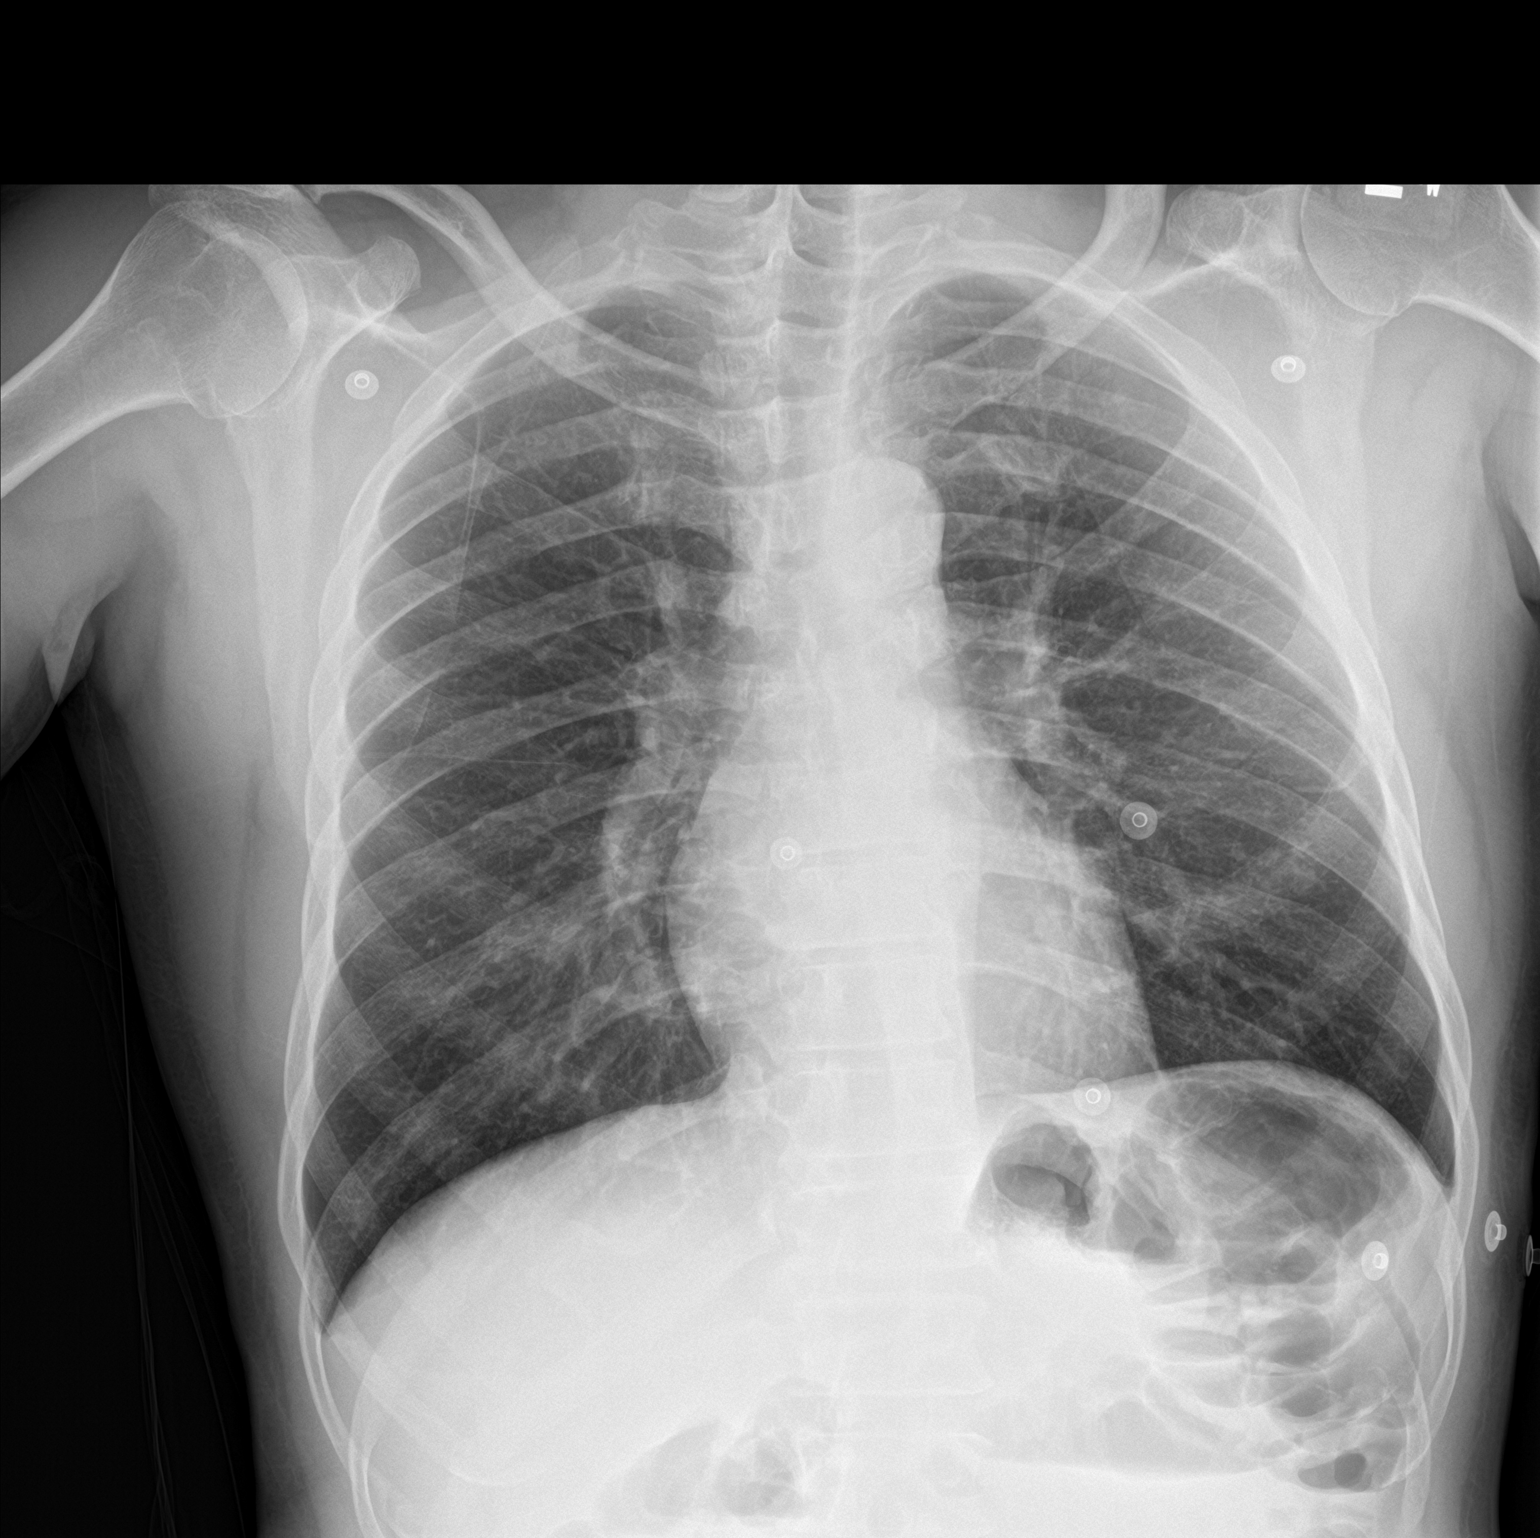

[chest lat]
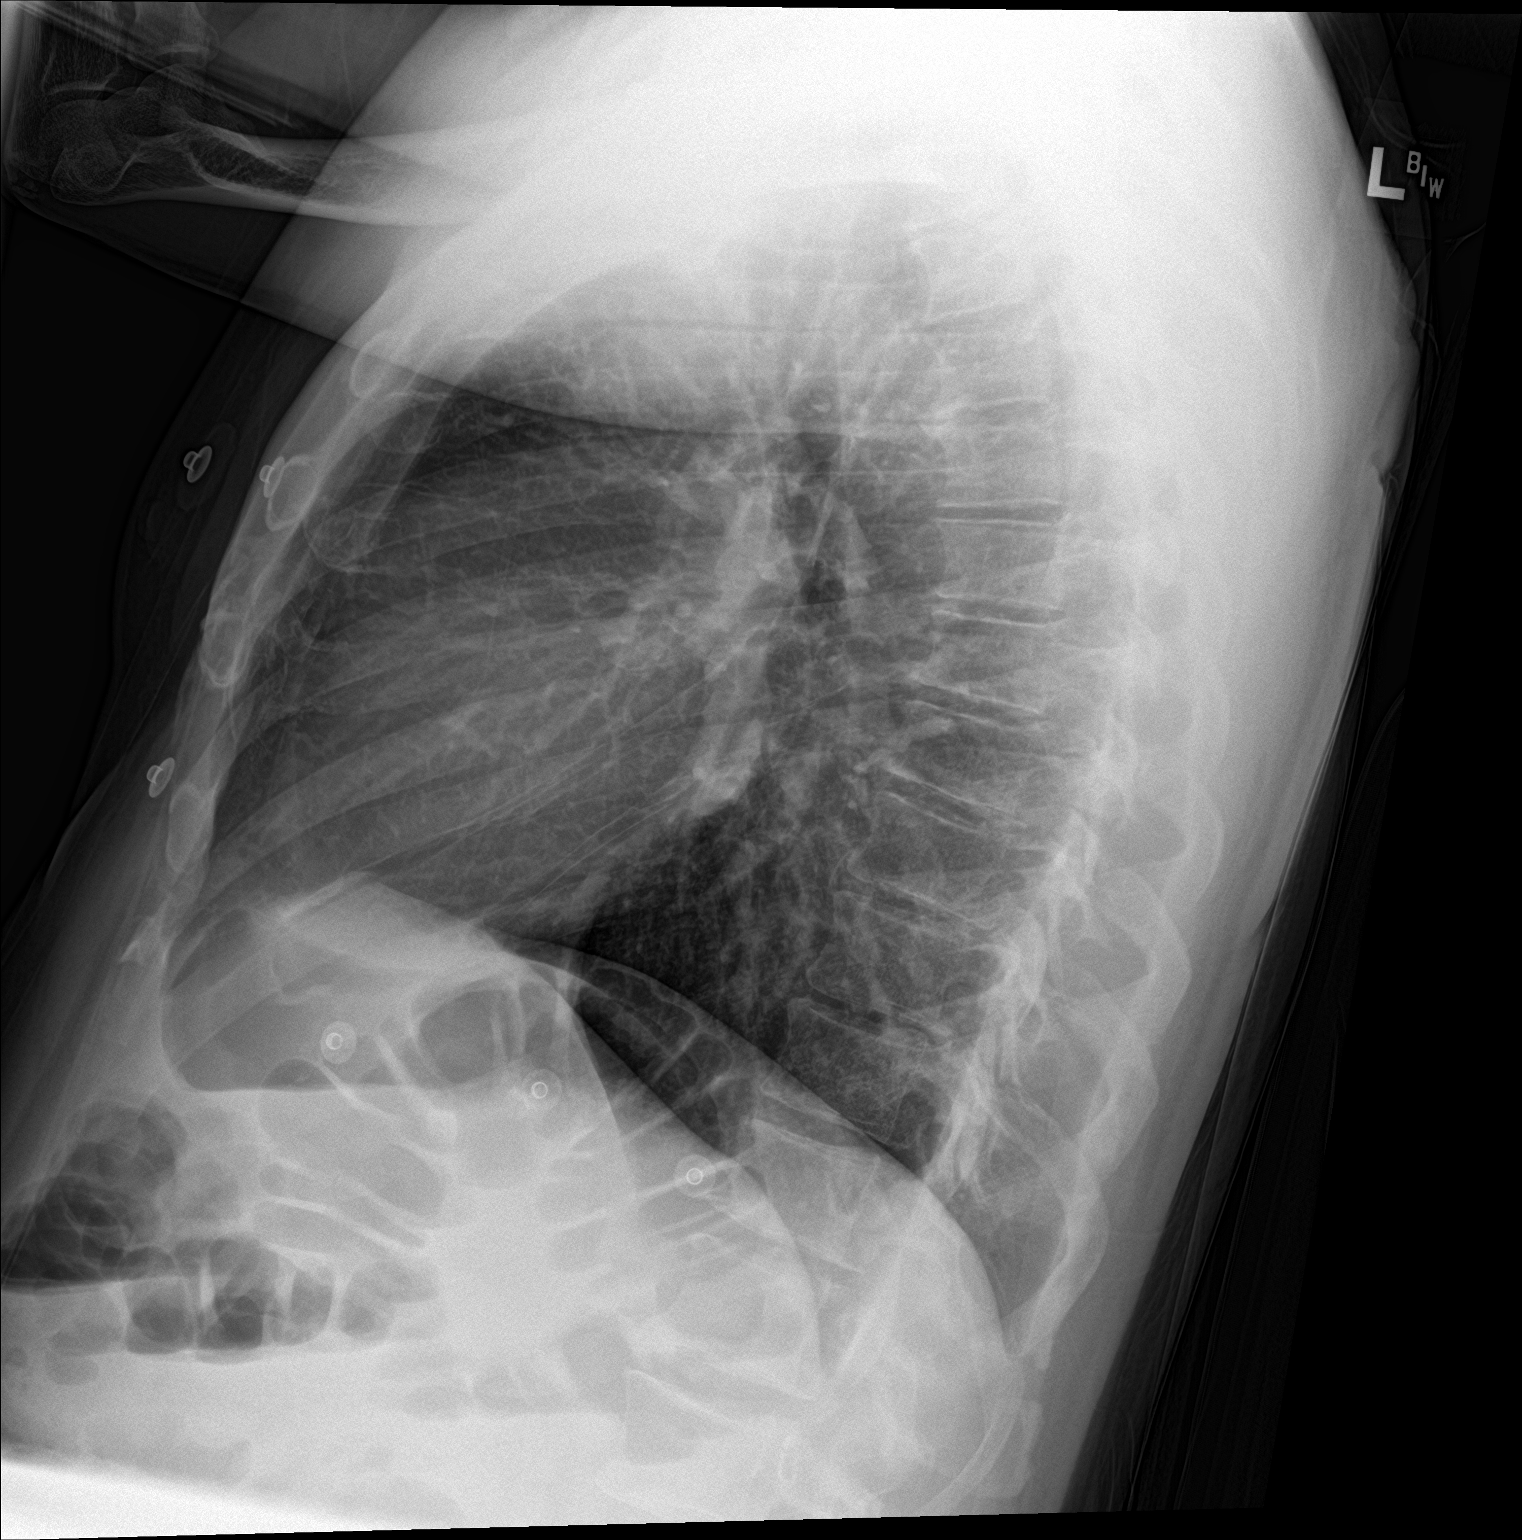

[2 of 2 positions shown; findings below may reference images not displayed]

FINDINGS: Normal heart size, mediastinal contours, and pulmonary vascularity.

Lungs clear.

No pulmonary infiltrate, pleural effusion, or pneumothorax.

Prior mid cervical fusion.

Osseous structures otherwise unremarkable.
IMPRESSION: No acute abnormalities.

## 2022-07-05 DIAGNOSIS — H30033 Focal chorioretinal inflammation, peripheral, bilateral: Secondary | ICD-10-CM | POA: Diagnosis not present

## 2022-07-05 DIAGNOSIS — Z79899 Other long term (current) drug therapy: Secondary | ICD-10-CM | POA: Diagnosis not present

## 2022-07-20 DIAGNOSIS — H209 Unspecified iridocyclitis: Secondary | ICD-10-CM | POA: Diagnosis not present

## 2022-07-20 DIAGNOSIS — H3581 Retinal edema: Secondary | ICD-10-CM | POA: Diagnosis not present

## 2022-07-20 DIAGNOSIS — H35033 Hypertensive retinopathy, bilateral: Secondary | ICD-10-CM | POA: Diagnosis not present

## 2022-07-20 DIAGNOSIS — H4042X2 Glaucoma secondary to eye inflammation, left eye, moderate stage: Secondary | ICD-10-CM | POA: Diagnosis not present

## 2022-07-20 DIAGNOSIS — Z947 Corneal transplant status: Secondary | ICD-10-CM | POA: Diagnosis not present

## 2022-07-20 DIAGNOSIS — H44521 Atrophy of globe, right eye: Secondary | ICD-10-CM | POA: Diagnosis not present

## 2022-07-20 DIAGNOSIS — Z79899 Other long term (current) drug therapy: Secondary | ICD-10-CM | POA: Diagnosis not present

## 2022-07-20 DIAGNOSIS — H30033 Focal chorioretinal inflammation, peripheral, bilateral: Secondary | ICD-10-CM | POA: Diagnosis not present

## 2022-07-20 DIAGNOSIS — H402224 Chronic angle-closure glaucoma, left eye, indeterminate stage: Secondary | ICD-10-CM | POA: Diagnosis not present

## 2022-07-28 DIAGNOSIS — Z947 Corneal transplant status: Secondary | ICD-10-CM | POA: Diagnosis not present

## 2022-07-28 DIAGNOSIS — Z961 Presence of intraocular lens: Secondary | ICD-10-CM | POA: Diagnosis not present

## 2022-07-28 DIAGNOSIS — H4042X2 Glaucoma secondary to eye inflammation, left eye, moderate stage: Secondary | ICD-10-CM | POA: Diagnosis not present

## 2022-07-28 DIAGNOSIS — H209 Unspecified iridocyclitis: Secondary | ICD-10-CM | POA: Diagnosis not present

## 2022-07-28 DIAGNOSIS — H30033 Focal chorioretinal inflammation, peripheral, bilateral: Secondary | ICD-10-CM | POA: Diagnosis not present

## 2022-07-28 DIAGNOSIS — H18421 Band keratopathy, right eye: Secondary | ICD-10-CM | POA: Diagnosis not present

## 2022-08-11 DIAGNOSIS — Z947 Corneal transplant status: Secondary | ICD-10-CM | POA: Diagnosis not present

## 2022-08-11 DIAGNOSIS — Z961 Presence of intraocular lens: Secondary | ICD-10-CM | POA: Diagnosis not present

## 2022-08-11 DIAGNOSIS — H209 Unspecified iridocyclitis: Secondary | ICD-10-CM | POA: Diagnosis not present

## 2022-08-11 DIAGNOSIS — H30033 Focal chorioretinal inflammation, peripheral, bilateral: Secondary | ICD-10-CM | POA: Diagnosis not present

## 2022-08-11 DIAGNOSIS — H18421 Band keratopathy, right eye: Secondary | ICD-10-CM | POA: Diagnosis not present

## 2022-08-11 DIAGNOSIS — H4042X2 Glaucoma secondary to eye inflammation, left eye, moderate stage: Secondary | ICD-10-CM | POA: Diagnosis not present

## 2022-09-06 DIAGNOSIS — H3581 Retinal edema: Secondary | ICD-10-CM | POA: Diagnosis not present

## 2022-09-06 DIAGNOSIS — H30033 Focal chorioretinal inflammation, peripheral, bilateral: Secondary | ICD-10-CM | POA: Diagnosis not present

## 2022-09-07 ENCOUNTER — Encounter: Payer: Medicare Other | Admitting: Internal Medicine

## 2022-09-07 NOTE — Progress Notes (Signed)
No show

## 2022-09-14 DIAGNOSIS — I10 Essential (primary) hypertension: Secondary | ICD-10-CM | POA: Diagnosis not present

## 2022-09-14 DIAGNOSIS — E782 Mixed hyperlipidemia: Secondary | ICD-10-CM | POA: Insufficient documentation

## 2022-09-14 DIAGNOSIS — H35033 Hypertensive retinopathy, bilateral: Secondary | ICD-10-CM | POA: Diagnosis not present

## 2022-09-14 DIAGNOSIS — Z79899 Other long term (current) drug therapy: Secondary | ICD-10-CM | POA: Diagnosis not present

## 2022-09-14 DIAGNOSIS — H4042X2 Glaucoma secondary to eye inflammation, left eye, moderate stage: Secondary | ICD-10-CM | POA: Diagnosis not present

## 2022-09-14 DIAGNOSIS — H30033 Focal chorioretinal inflammation, peripheral, bilateral: Secondary | ICD-10-CM | POA: Diagnosis not present

## 2022-09-14 DIAGNOSIS — H402224 Chronic angle-closure glaucoma, left eye, indeterminate stage: Secondary | ICD-10-CM | POA: Diagnosis not present

## 2022-09-14 DIAGNOSIS — H209 Unspecified iridocyclitis: Secondary | ICD-10-CM | POA: Diagnosis not present

## 2022-09-14 DIAGNOSIS — Z947 Corneal transplant status: Secondary | ICD-10-CM | POA: Diagnosis not present

## 2022-09-14 DIAGNOSIS — H44521 Atrophy of globe, right eye: Secondary | ICD-10-CM | POA: Diagnosis not present

## 2022-09-14 DIAGNOSIS — H3581 Retinal edema: Secondary | ICD-10-CM | POA: Diagnosis not present

## 2022-09-14 HISTORY — DX: Mixed hyperlipidemia: E78.2

## 2022-09-15 ENCOUNTER — Encounter: Payer: Self-pay | Admitting: Cardiology

## 2022-09-15 ENCOUNTER — Ambulatory Visit: Payer: Medicare Other | Attending: Cardiology | Admitting: Cardiology

## 2022-09-15 VITALS — BP 130/70 | HR 62 | Ht 65.0 in | Wt 161.0 lb

## 2022-09-15 DIAGNOSIS — E119 Type 2 diabetes mellitus without complications: Secondary | ICD-10-CM

## 2022-09-15 DIAGNOSIS — I119 Hypertensive heart disease without heart failure: Secondary | ICD-10-CM

## 2022-09-15 DIAGNOSIS — I2510 Atherosclerotic heart disease of native coronary artery without angina pectoris: Secondary | ICD-10-CM | POA: Diagnosis not present

## 2022-09-15 MED ORDER — ISOSORBIDE MONONITRATE ER 30 MG PO TB24: 30.0000 mg | ORAL_TABLET | Freq: Every day | ORAL | 3 refills | Status: DC

## 2022-09-15 NOTE — Patient Instructions (Addendum)
Medication Instructions:  Your physician has recommended you make the following change in your medication:   START: Imdur 30mg  1 tablet daily    Lab Work: None Ordered If you have labs (blood work) drawn today and your tests are completely normal, you will receive your results only by: MyChart Message (if you have MyChart) OR A paper copy in the mail If you have any lab test that is abnormal or we need to change your treatment, we will call you to review the results.   Testing/Procedures: None Ordered   Follow-Up: At [facility], you and your health needs are our priority.  As part of our continuing mission to provide you with exceptional heart care, we have created designated Provider Care Teams.  These Care Teams include your primary Cardiologist (physician) and Advanced Practice Providers (APPs -  Physician Assistants and Nurse Practitioners) who all work together to provide you with the care you need, when you need it.  We recommend signing up for the patient portal called "MyChart".  Sign up information is provided on this After Visit Summary.  MyChart is used to connect with patients for Virtual Visits (Telemedicine).  Patients are able to view lab/test results, encounter notes, upcoming appointments, etc.  Non-urgent messages can be sent to your provider as well.   To learn more about what you can do with MyChart, go to [facility].    Your next appointment:   6 month(s)  The format for your next appointment:   In Person  Provider:   [email or web address], MD    Other Instructions NA

## 2022-09-15 NOTE — Progress Notes (Signed)
Cardiology Office Note:    Date:  09/15/2022   ID:  Steven [name], [place] 04/22/1956, MRN [id number]  PCP:  Dorothyann Peng, MD  Cardiologist:  Gypsy Balsam, MD    Referring MD: Dorothyann Peng, MD   No chief complaint on file.   History of Present Illness:    Steven [name] is a 66 y.o. male  with past medical history significant for coronary artery disease.  In 2007 he had cardiac catheterization required stent to left anterior descending artery.  Last evaluation of his coronary artery status was done by stress test last year which was normal.  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  He comes today 2 months for follow-up from that he is doing fine but he described to have some atypical chest pain pain is located in the right side of his chest typically happens when he gets up in the morning.  Otherwise denies of any shortness of breath.  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 eye with blurred vision 12/18/2011   HTN (hypertension) 12/18/2011   Hypertension    Mixed hyperlipidemia 09/14/2022   Rectal bleeding 03/31/2021    Past Surgical History:  Procedure Laterality Date   CARDIAC SURGERY     stent placed in 2007   CORNEAL TRANSPLANT     CORONARY ANGIOPLASTY  2008   STENT PLACED IN LAD.MILD DIFFUSE IN-STENT RESTENOSIS IN 2010.   CORONARY STENT PLACEMENT     MYOCARDIAL PERFUSION STUDY  01/19/13   NORMAL STRESS NUCLEAR STUDY.    TRANSTHORACIC ECHOCARDIOGRAM  07/28/2009   LV= MILD LVH. EF 60% TO 65%.   VASCULAR US  01/15/09   NORMAL LOWER ARTERIAL DOPPLER    Current Medications: Current Meds  Medication Sig   aspirin EC 81 MG tablet Take 81 mg by mouth daily.   atorvastatin (LIPITOR) 40 MG tablet Take 1 tablet (40 mg total) by mouth daily.   cycloSPORINE modified (NEORAL) 25 MG capsule Take 25 mg by mouth 3 (three) times daily.   dorzolamide-timolol (COSOPT) 22.3-6.8 MG/ML ophthalmic solution Place 1 drop into the left eye 2 (two) times daily.   folic acid (FOLVITE) 1 MG tablet Take 1 mg by mouth daily. 4 tabs daily   latanoprost (XALATAN) 0.005 % ophthalmic solution Place 1 drop into both eyes at bedtime.   LINZESS 290 MCG CAPS capsule Take 290 mcg by mouth daily.   lisinopril (ZESTRIL) 10 MG tablet TAKE 1 TABLET BY MOUTH EVERY DAY (Patient taking differently: Take 10 mg by mouth daily.)   methotrexate (RHEUMATREX) 2.5 MG tablet Take 10 mg by mouth once a week. Fridays. Caution:Chemotherapy. Protect from light.   prednisoLONE acetate (PRED FORTE) 1 % ophthalmic suspension Place 1 drop into the left eye daily.   pregabalin (LYRICA) 50 MG capsule Take 1 capsule by mouth daily.   sildenafil (REVATIO) 20 MG tablet Take one hour prior to sexual activity (Patient taking differently: Take 20 mg by mouth  as needed (Take one hour prior to sexual activity). Take one hour prior to sexual activity)     Allergies:   Patient has no known allergies.   Social History   Socioeconomic History   Marital status: Divorced    Spouse name: Not on file   Number of children: Not on file   Years of education: Not on file   Highest education level: Not on file  Occupational History   Occupation: disability  Tobacco Use   Smoking status: Former    Packs/day: 0.75    Years: 20.00    Total pack years: 15.00    Types: Cigarettes    Start date: 10/11/1981    Quit date: 10/11/2001    Years since quitting: 20.9   Smokeless tobacco:  Never  Vaping Use   Vaping Use: Never used  Substance and Sexual Activity   Alcohol use: No    Alcohol/week: 0.0 standard drinks of alcohol   Drug use: Not Currently   Sexual activity: Yes  Other Topics Concern   Not on file  Social History Narrative   Not on file   Social Determinants of Health   Financial Resource Strain: Low Risk  (10/14/2021)   Overall Financial Resource Strain (CARDIA)    Difficulty of Paying Living Expenses: Not hard at all  Food Insecurity: No Food Insecurity (10/14/2021)   Hunger Vital Sign    Worried About Running Out of Food in the Last Year: Never true    Ran Out of Food in the Last Year: Never true  Transportation Needs: No Transportation Needs (10/14/2021)   PRAPARE - Administrator, Civil Service (Medical): No    Lack of Transportation (Non-Medical): No  Physical Activity: Sufficiently Active (10/14/2021)   Exercise Vital Sign    Days of Exercise per Week: 7 days    Minutes of Exercise per Session: 30 min  Stress: No Stress Concern Present (10/14/2021)   [employer] of Occupational Health - Occupational Stress Questionnaire    Feeling of Stress : Not at all  Social Connections: Not on file     Family History: The patient's family history includes Diabetes in his mother; Heart attack in his mother; Hypertension in his father. ROS:   Please see the history of present illness.    All 14 point review of systems negative except as described per history of present illness  EKGs/Labs/Other Studies Reviewed:      Recent Labs: 12/22/2021: ALT 24; Hemoglobin 14.0; Platelets 191 05/06/2022: BUN 8; Creatinine, Ser 1.04; Potassium 4.1; Sodium 137  Recent Lipid Panel    Component Value Date/Time   CHOL 164 12/28/2021 1034   TRIG 52 12/28/2021 1034   HDL 77 12/28/2021 1034   CHOLHDL 2.1 12/28/2021 1034   CHOLHDL 2.6 12/19/2011 0138   VLDL 12 12/19/2011 0138   LDLCALC 76 12/28/2021 1034    Physical Exam:    VS:  BP 130/70 (BP  Location: Left Arm, Patient Position: Sitting)   Pulse 62   Ht 5\' 5"  (1.651 m)   Wt 161 lb (73 kg)   SpO2 99%   BMI 26.79 kg/m     Wt Readings from Last 3 Encounters:  09/15/22 161 lb (73 kg)  05/06/22 158 lb 12.8 oz (72 kg)  12/28/21 161 lb (73 kg)     GEN:  Well nourished, well developed in no acute distress HEENT: Normal NECK: No JVD; No carotid bruits LYMPHATICS: No lymphadenopathy CARDIAC: RRR, no murmurs, no rubs, no  gallops RESPIRATORY:  Clear to auscultation without rales, wheezing or rhonchi  ABDOMEN: Soft, non-tender, non-distended MUSCULOSKELETAL:  No edema; No deformity  SKIN: Warm and dry LOWER EXTREMITIES: no swelling NEUROLOGIC:  Alert and oriented x 3 PSYCHIATRIC:  Normal affect   ASSESSMENT:    1. Coronary artery disease involving native coronary artery of native heart without angina pectoris   2. Benign hypertensive heart disease without heart failure   3. Diabetes mellitus type 2 in nonobese [facility])    PLAN:    In order of problems listed above:  Coronary disease he does have some pain I will continue antiplatelets therapy, I will add Imdur 30 to his medical regimen. Benign essential hypertension blood pressure well-controlled continue present management. Diabetes mellitus to be followed by internal medicine team I did review K PN which show me hemoglobin A1c of 5.9 this is from 05/06/2022. Dyslipidemia I did review K PN show me LDL 76 HDL 77 this is from March 8453.  I will continue present medication which include high intense statin for of Lipitor 40.   Medication Adjustments/Labs and Tests Ordered: Current medicines are reviewed at length with the patient today.  Concerns regarding medicines are outlined above.  No orders of the defined types were placed in this encounter.  Medication changes: No orders of the defined types were placed in this encounter.   Signed, Georgeanna Lea, MD, [facility] 09/15/2022 11:22 AM    [facility]  [facility]

## 2022-09-15 NOTE — Addendum Note (Signed)
Addended by: Baldo Ash D on: 09/15/2022 11:33 AM   Modules accepted: Orders

## 2022-10-05 ENCOUNTER — Other Ambulatory Visit: Payer: Self-pay | Admitting: Internal Medicine

## 2022-11-04 ENCOUNTER — Encounter: Payer: Self-pay | Admitting: Internal Medicine

## 2022-11-04 ENCOUNTER — Ambulatory Visit (INDEPENDENT_AMBULATORY_CARE_PROVIDER_SITE_OTHER): Payer: 59 | Admitting: Internal Medicine

## 2022-11-04 ENCOUNTER — Ambulatory Visit (INDEPENDENT_AMBULATORY_CARE_PROVIDER_SITE_OTHER): Payer: 59

## 2022-11-04 VITALS — BP 122/50 | HR 76 | Temp 97.8°F | Ht 64.8 in | Wt 164.2 lb

## 2022-11-04 VITALS — BP 122/50 | HR 76 | Temp 97.8°F | Ht 64.0 in | Wt 164.0 lb

## 2022-11-04 DIAGNOSIS — I119 Hypertensive heart disease without heart failure: Secondary | ICD-10-CM | POA: Diagnosis not present

## 2022-11-04 DIAGNOSIS — Z0000 Encounter for general adult medical examination without abnormal findings: Secondary | ICD-10-CM

## 2022-11-04 DIAGNOSIS — I2510 Atherosclerotic heart disease of native coronary artery without angina pectoris: Secondary | ICD-10-CM

## 2022-11-04 DIAGNOSIS — M25552 Pain in left hip: Secondary | ICD-10-CM

## 2022-11-04 DIAGNOSIS — E1140 Type 2 diabetes mellitus with diabetic neuropathy, unspecified: Secondary | ICD-10-CM

## 2022-11-04 DIAGNOSIS — E559 Vitamin D deficiency, unspecified: Secondary | ICD-10-CM

## 2022-11-04 LAB — POCT URINALYSIS DIPSTICK
Bilirubin, UA: NEGATIVE
Blood, UA: NEGATIVE
Glucose, UA: NEGATIVE
Ketones, UA: NEGATIVE
Leukocytes, UA: NEGATIVE
Nitrite, UA: NEGATIVE
Protein, UA: NEGATIVE
Spec Grav, UA: 1.025 (ref 1.010–1.025)
Urobilinogen, UA: 0.2 E.U./dL
pH, UA: 5.5 (ref 5.0–8.0)

## 2022-11-04 NOTE — Patient Instructions (Signed)
Steven [name] , Thank you for taking time to come for your Medicare Wellness Visit. I appreciate your ongoing commitment to your health goals. Please review the following plan we discussed and let me know if I can assist you in the future.   These are the goals we discussed:  Goals      Patient Stated     No goals     Patient Stated     01/10/2020, wants to stay healthy     Patient Stated     01/22/2021, no goals     Patient Stated     10/14/2021, no goals     Patient Stated     11/04/2022, no goals        This is a list of the screening recommended for you and due dates:  Health Maintenance  Topic Date Due   DTaP/Tdap/Td vaccine (1 - Tdap) Never done   COVID-19 Vaccine (1) 11/20/2022*   Flu Shot  01/09/2023*   Zoster (Shingles) Vaccine (1 of 2) 02/03/2023*   Pneumonia Vaccine (1 - PCV) 11/05/2023*   Hemoglobin A1C  11/06/2022   Complete foot exam   12/23/2022   Eye exam for diabetics  02/25/2023   Yearly kidney function blood test for diabetes  05/07/2023   Yearly kidney health urinalysis for diabetes  05/07/2023   Medicare Annual Wellness Visit  11/05/2023   Colon Cancer Screening  06/28/2032   Hepatitis C Screening: USPSTF Recommendation to screen - Ages 18-79 yo.  Completed   HIV Screening  Completed   HPV Vaccine  Aged Out  *Topic was postponed. The date shown is not the original due date.    Advanced directives: Advance directive discussed with you today. Even though you declined this today please call our office should you change your mind and we can give you the proper paperwork for you to fill out.  Conditions/risks identified: none  Next appointment: Follow up in one year for your annual wellness visit.   Preventive Care 15 Years and Older, Male  Preventive care refers to lifestyle choices and visits with your health care provider that can promote health and wellness. What does preventive care include? A yearly physical exam. This is also called an annual  well check. Dental exams once or twice a year. Routine eye exams. Ask your health care provider how often you should have your eyes checked. Personal lifestyle choices, including: Daily care of your teeth and gums. Regular physical activity. Eating a healthy diet. Avoiding tobacco and drug use. Limiting alcohol use. Practicing safe sex. Taking low doses of aspirin every day. Taking vitamin and mineral supplements as recommended by your health care provider. What happens during an annual well check? The services and screenings done by your health care provider during your annual well check will depend on your age, overall health, lifestyle risk factors, and family history of disease. Counseling  Your health care provider may ask you questions about your: Alcohol use. Tobacco use. Drug use. Emotional well-being. Home and relationship well-being. Sexual activity. Eating habits. History of falls. Memory and ability to understand (cognition). Work and work Statistician. Screening  You may have the following tests or measurements: Height, weight, and BMI. Blood pressure. Lipid and cholesterol levels. These may be checked every 5 years, or more frequently if you are over 75 years old. Skin check. Lung cancer screening. You may have this screening every year starting at age 26 if you have a 30-pack-year history of smoking and currently smoke  or have quit within the past 15 years. Fecal occult blood test (FOBT) of the stool. You may have this test every year starting at age 17. Flexible sigmoidoscopy or colonoscopy. You may have a sigmoidoscopy every 5 years or a colonoscopy every 10 years starting at age 53. Prostate cancer screening. Recommendations will vary depending on your family history and other risks. Hepatitis C blood test. Hepatitis B blood test. Sexually transmitted disease (STD) testing. Diabetes screening. This is done by checking your blood sugar (glucose) after you have  not eaten for a while (fasting). You may have this done every 1-3 years. Abdominal aortic aneurysm (AAA) screening. You may need this if you are a current or former smoker. Osteoporosis. You may be screened starting at age 42 if you are at high risk. Talk with your health care provider about your test results, treatment options, and if necessary, the need for more tests. Vaccines  Your health care provider may recommend certain vaccines, such as: Influenza vaccine. This is recommended every year. Tetanus, diphtheria, and acellular pertussis (Tdap, Td) vaccine. You may need a Td booster every 10 years. Zoster vaccine. You may need this after age 5. Pneumococcal 13-valent conjugate (PCV13) vaccine. One dose is recommended after age 75. Pneumococcal polysaccharide (PPSV23) vaccine. One dose is recommended after age 43. Talk to your health care provider about which screenings and vaccines you need and how often you need them. This information is not intended to replace advice given to you by your health care provider. Make sure you discuss any questions you have with your health care provider. Document Released: 10/24/2015 Document Revised: 06/16/2016 Document Reviewed: 07/29/2015 Elsevier Interactive Patient Education  2017 [place] Prevention in the Home Falls can cause injuries. They can happen to people of all ages. There are many things you can do to make your home safe and to help prevent falls. What can I do on the outside of my home? Regularly fix the edges of walkways and driveways and fix any cracks. Remove anything that might make you trip as you walk through a door, such as a raised step or threshold. Trim any bushes or trees on the path to your home. Use bright outdoor lighting. Clear any walking paths of anything that might make someone trip, such as rocks or tools. Regularly check to see if handrails are loose or broken. Make sure that both sides of any steps have  handrails. Any raised decks and porches should have guardrails on the edges. Have any leaves, snow, or ice cleared regularly. Use sand or salt on walking paths during winter. Clean up any spills in your garage right away. This includes oil or grease spills. What can I do in the bathroom? Use night lights. Install grab bars by the toilet and in the tub and shower. Do not use towel bars as grab bars. Use non-skid mats or decals in the tub or shower. If you need to sit down in the shower, use a plastic, non-slip stool. Keep the floor dry. Clean up any water that spills on the floor as soon as it happens. Remove soap buildup in the tub or shower regularly. Attach bath mats securely with double-sided non-slip rug tape. Do not have throw rugs and other things on the floor that can make you trip. What can I do in the bedroom? Use night lights. Make sure that you have a light by your bed that is easy to reach. Do not use any sheets or blankets  that are too big for your bed. They should not hang down onto the floor. Have a firm chair that has side arms. You can use this for support while you get dressed. Do not have throw rugs and other things on the floor that can make you trip. What can I do in the kitchen? Clean up any spills right away. Avoid walking on wet floors. Keep items that you use a lot in easy-to-reach places. If you need to reach something above you, use a strong step stool that has a grab bar. Keep electrical cords out of the way. Do not use floor polish or wax that makes floors slippery. If you must use wax, use non-skid floor wax. Do not have throw rugs and other things on the floor that can make you trip. What can I do with my stairs? Do not leave any items on the stairs. Make sure that there are handrails on both sides of the stairs and use them. Fix handrails that are broken or loose. Make sure that handrails are as long as the stairways. Check any carpeting to make sure  that it is firmly attached to the stairs. Fix any carpet that is loose or worn. Avoid having throw rugs at the top or bottom of the stairs. If you do have throw rugs, attach them to the floor with carpet tape. Make sure that you have a light switch at the top of the stairs and the bottom of the stairs. If you do not have them, ask someone to add them for you. What else can I do to help prevent falls? Wear shoes that: Do not have high heels. Have rubber bottoms. Are comfortable and fit you well. Are closed at the toe. Do not wear sandals. If you use a stepladder: Make sure that it is fully opened. Do not climb a closed stepladder. Make sure that both sides of the stepladder are locked into place. Ask someone to hold it for you, if possible. Clearly mark and make sure that you can see: Any grab bars or handrails. First and last steps. Where the edge of each step is. Use tools that help you move around (mobility aids) if they are needed. These include: Canes. Walkers. Scooters. Crutches. Turn on the lights when you go into a dark area. Replace any light bulbs as soon as they burn out. Set up your furniture so you have a clear path. Avoid moving your furniture around. If any of your floors are uneven, fix them. If there are any pets around you, be aware of where they are. Review your medicines with your doctor. Some medicines can make you feel dizzy. This can increase your chance of falling. Ask your doctor what other things that you can do to help prevent falls. This information is not intended to replace advice given to you by your health care provider. Make sure you discuss any questions you have with your health care provider. Document Released: 07/24/2009 Document Revised: 03/04/2016 Document Reviewed: 11/01/2014 Elsevier Interactive Patient Education  2017 [employer].

## 2022-11-04 NOTE — Patient Instructions (Signed)

## 2022-11-04 NOTE — Progress Notes (Unsigned)
I,Steven [name],acting as a scribe for Steven Greenland, MD.,have documented all relevant documentation on the behalf of Steven Greenland, MD,as directed by  Steven Greenland, MD while in the presence of Steven Greenland, MD.    Subjective:     Patient ID: Steven [name] , male    DOB: [birth year] , 67 y.o.   MRN: [id number]   Chief Complaint  Patient presents with   Diabetes   Hypertension    HPI  Patient presents today for a diabetes and HTN check. He reports compliance with his meds. He denies headaches, chest pain and shortness of breath. He has regular eye exams, he is up to date.  He is also scheduled with [facility] Advisor for AWV.    Diabetes He presents for his follow-up diabetic visit. He has type 2 diabetes mellitus. Pertinent negatives for diabetes include no blurred vision, no chest pain, no polydipsia, no polyphagia and no polyuria. There are no hypoglycemic complications. Risk factors for coronary artery disease include diabetes mellitus, dyslipidemia, male sex and hypertension. Current diabetic treatment includes diet. His weight is stable. He is following a diabetic diet. He participates in exercise daily. He does not see a podiatrist.Eye exam is current.  Hypertension This is a chronic problem. The current episode started more than 1 year ago. Pertinent negatives include no blurred vision or chest pain. Risk factors for coronary artery disease include male gender, diabetes mellitus and dyslipidemia. Hypertensive end-organ damage includes CAD/MI.     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 eye with blurred vision  12/18/2011   HTN (hypertension) 12/18/2011   Hypertension    Mixed hyperlipidemia 09/14/2022   Rectal bleeding 03/31/2021     Family History  Problem Relation Age of Onset   Heart attack Mother    Diabetes Mother    Hypertension Father      Current Outpatient Medications:    aspirin EC 81 MG tablet, Take 81 mg by mouth daily., Disp: , Rfl:    atorvastatin (LIPITOR) 40 MG tablet, Take 1 tablet (40 mg total) by mouth daily., Disp: 90 tablet, Rfl: 3   cycloSPORINE modified (NEORAL) 25 MG capsule, Take 25 mg by mouth 3 (three) times daily., Disp: , Rfl:    dorzolamide-timolol (COSOPT) 22.3-6.8 MG/ML ophthalmic solution, Place 1 drop into the left eye 2 (two) times daily., Disp: , Rfl:    folic acid (FOLVITE) 1 MG tablet, Take 1 mg by mouth daily. 4 tabs daily, Disp: , Rfl:    isosorbide mononitrate (IMDUR) 30 MG 24 hr tablet, Take 1 tablet (30 mg total) by mouth daily., Disp: 90 tablet, Rfl: 3   latanoprost (XALATAN) 0.005 % ophthalmic solution, Place 1 drop into both eyes at bedtime., Disp: , Rfl:    LINZESS 290 MCG CAPS capsule, Take 290 mcg by mouth daily., Disp: , Rfl:    lisinopril (ZESTRIL) 10 MG tablet, Take 1 tablet (10 mg total) by mouth daily., Disp: 90 tablet, Rfl: 1   methotrexate (RHEUMATREX) 2.5 MG tablet, Take 10 mg by mouth once a week. Fridays. Caution:Chemotherapy. Protect from light. (Patient not  taking: Reported on 11/04/2022), Disp: , Rfl:    prednisoLONE acetate (PRED FORTE) 1 % ophthalmic suspension, Place 1 drop into the left eye daily., Disp: , Rfl:    pregabalin (LYRICA) 50 MG capsule, Take 1 capsule by mouth daily. (Patient not taking: Reported on 11/04/2022), Disp: , Rfl:    sildenafil (REVATIO) 20 MG tablet, Take one hour prior to sexual activity (Patient taking differently: Take 20 mg by mouth as needed (Take one hour prior to sexual activity). Take one hour prior to sexual activity), Disp: 10 tablet, Rfl: 0   No Known Allergies   Review of Systems  Constitutional:  Negative.   Eyes:  Negative for blurred vision.  Respiratory: Negative.    Cardiovascular: Negative.  Negative for chest pain.  Gastrointestinal: Negative.   Endocrine: Negative for polydipsia, polyphagia and polyuria.  Musculoskeletal:  Positive for arthralgias.       He c/o left hip pain. There is pain with ambulation. Denies fall/trauma. Sx started about six months ago. He is not sure what could have triggered his sx. He has tried Tylenol without any relief of his sx.   Neurological: Negative.      Today's Vitals   11/04/22 1532  BP: (!) 122/50  Pulse: 76  Temp: 97.8 F (36.6 C)  SpO2: 97%  Weight: 164 lb (74.4 kg)  Height: 5\' 4"  (1.626 m)   Body mass index is 28.15 kg/m.  BP Readings from Last 3 Encounters:  11/04/22 (!) 122/50  11/04/22 (!) 122/50  09/15/22 130/70    Objective:  Physical Exam Vitals and nursing note reviewed.  Constitutional:      Appearance: Normal appearance.  HENT:     Head: Normocephalic and atraumatic.     Nose:     Comments: Masked     Mouth/Throat:     Comments: Masked  Eyes:     Extraocular Movements: Extraocular movements intact.  Cardiovascular:     Rate and Rhythm: Normal rate and regular rhythm.     Heart sounds: Normal heart sounds.  Pulmonary:     Effort: Pulmonary effort is normal.     Breath sounds: Normal breath sounds.  Musculoskeletal:        General: Tenderness present.     Cervical back: Normal range of motion.     Comments: Pain w/ flexion  Skin:    General: Skin is warm.  Neurological:     General: No focal deficit present.     Mental Status: He is alert.  Psychiatric:        Mood and Affect: Mood normal.      Assessment And Plan:     1. Type 2 diabetes mellitus with diabetic neuropathy, without long-term current use of insulin (HCC) Comments: Chronic, I will check labs as below. He has been able to control his DM without meds. He was congratulated on his efforts. - POCT Urinalysis Dipstick (81002) -  Microalbumin / creatinine urine ratio - CMP14+EGFR - CBC - Hemoglobin A1c  2. Benign hypertensive heart disease without heart failure Comments: Chronic, he will c/w lisinopril 10mg  daily. He is encouraged to follow low sodium diet. - POCT Urinalysis Dipstick (81002) - Microalbumin / creatinine urine ratio - CMP14+EGFR - CBC - Hemoglobin A1c  3. Atherosclerosis of native coronary artery of native heart without angina pectoris Comments: Chronic, LDL goal <70. He will continue with atorvastatin and ASA 81mg  daily. Encouraged to follow heart healthy lifestyle.  4. Left hip pain Comments: Sx suggestive of trochanteric bursitis.  He agrees to [facility] referral for further evaluation and radiographic studies. He will try Tylenol prn for now. - Ambulatory referral to Orthopedic Surgery  5. Vitamin D deficiency disease Comments: I will check vitamin D level and supplement as needed. If low, this could be contributing to his hip pain. - Vitamin D (25 hydroxy)   Patient was given opportunity to ask questions. Patient verbalized understanding of the plan and was able to repeat key elements of the plan. All questions were answered to their satisfaction.   I, Gwynneth Aliment, MD, have reviewed all documentation for this visit. The documentation on 11/07/22 for the exam, diagnosis, procedures, and orders are all accurate and complete.   IF YOU HAVE BEEN REFERRED TO A SPECIALIST, IT MAY TAKE 1-2 WEEKS TO SCHEDULE/PROCESS THE REFERRAL. IF YOU HAVE NOT HEARD FROM US/SPECIALIST IN TWO WEEKS, PLEASE GIVE [place] A CALL AT [phone].   THE PATIENT IS ENCOURAGED TO PRACTICE SOCIAL DISTANCING DUE TO THE COVID-19 PANDEMIC.

## 2022-11-04 NOTE — Progress Notes (Signed)
Subjective:   Steven [name] is a 67 y.o. male who presents for Medicare Annual/Subsequent preventive examination.  Review of Systems     Cardiac Risk Factors include: advanced age (>62men, >16 women);diabetes mellitus;dyslipidemia;hypertension;male gender     Objective:    Today's Vitals   11/04/22 1511 11/04/22 1518  BP: (!) 122/50   Pulse: 76   Temp: 97.8 F (36.6 C)   TempSrc: Oral   SpO2: 97%   Weight: 164 lb 3.2 oz (74.5 kg)   Height: 5' 4.8" (1.646 m)   PainSc:  8    Body mass index is 27.49 kg/m.     11/04/2022    3:21 PM 10/14/2021    3:17 PM 03/18/2021   10:30 AM 01/22/2021    9:42 AM 01/10/2020   11:10 AM 07/13/2019    7:15 PM 01/04/2019    2:51 PM  Advanced Directives  Does Patient Have a Medical Advance Directive? No No No No No No Yes  Type of Building surveyor of Healthcare Power of Attorney in Chart?       No - copy requested  Would patient like information on creating a medical advance directive? No - Patient declined No - Patient declined No - Patient declined No - Patient declined No - Patient declined No - Patient declined     Current Medications (verified) Outpatient Encounter Medications as of 11/04/2022  Medication Sig   aspirin EC 81 MG tablet Take 81 mg by mouth daily.   atorvastatin (LIPITOR) 40 MG tablet Take 1 tablet (40 mg total) by mouth daily.   cycloSPORINE modified (NEORAL) 25 MG capsule Take 25 mg by mouth 3 (three) times daily.   dorzolamide-timolol (COSOPT) 22.3-6.8 MG/ML ophthalmic solution Place 1 drop into the left eye 2 (two) times daily.   folic acid (FOLVITE) 1 MG tablet Take 1 mg by mouth daily. 4 tabs daily   isosorbide mononitrate (IMDUR) 30 MG 24 hr tablet Take 1 tablet (30 mg total) by mouth daily.   latanoprost (XALATAN) 0.005 % ophthalmic solution Place 1 drop into both eyes at bedtime.   LINZESS 290 MCG CAPS capsule Take 290 mcg by mouth daily.   lisinopril (ZESTRIL) 10 MG tablet  Take 1 tablet (10 mg total) by mouth daily.   prednisoLONE acetate (PRED FORTE) 1 % ophthalmic suspension Place 1 drop into the left eye daily.   sildenafil (REVATIO) 20 MG tablet Take one hour prior to sexual activity (Patient taking differently: Take 20 mg by mouth as needed (Take one hour prior to sexual activity). Take one hour prior to sexual activity)   methotrexate (RHEUMATREX) 2.5 MG tablet Take 10 mg by mouth once a week. Fridays. Caution:Chemotherapy. Protect from light. (Patient not taking: Reported on 11/04/2022)   pregabalin (LYRICA) 50 MG capsule Take 1 capsule by mouth daily. (Patient not taking: Reported on 11/04/2022)   No facility-administered encounter medications on file as of 11/04/2022.    Allergies (verified) Patient has no known allergies.   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 eye  with blurred vision 12/18/2011   HTN (hypertension) 12/18/2011   Hypertension    Mixed hyperlipidemia 09/14/2022   Rectal bleeding 03/31/2021   Past Surgical History:  Procedure Laterality Date   CARDIAC SURGERY     stent placed in 2007   [place]  2008   STENT PLACED IN LAD.MILD DIFFUSE IN-STENT RESTENOSIS IN 2010.   CORONARY STENT PLACEMENT     MYOCARDIAL PERFUSION STUDY  01/19/13   NORMAL STRESS NUCLEAR STUDY.   TRANSTHORACIC ECHOCARDIOGRAM  07/28/2009   LV= MILD LVH. EF 60% TO 65%.   VASCULAR US  01/15/09   NORMAL LOWER ARTERIAL DOPPLER   Family History  Problem Relation Age of Onset   Heart attack Mother    Diabetes Mother    Hypertension Father    Social History   Socioeconomic  History   Marital status: Divorced    Spouse name: Not on file   Number of children: Not on file   Years of education: Not on file   Highest education level: Not on file  Occupational History   Occupation: disability  Tobacco Use   Smoking status: Former    Packs/day: 0.75    Years: 20.00    Total pack years: 15.00    Types: Cigarettes    Start date: 10/11/1981    Quit date: 10/11/2001    Years since quitting: 21.0   Smokeless tobacco: Never  Vaping Use   Vaping Use: Never used  Substance and Sexual Activity   Alcohol use: No    Alcohol/week: 0.0 standard drinks of alcohol   Drug use: Not Currently   Sexual activity: Yes  Other Topics Concern   Not on file  Social History Narrative   Not on file   Social Determinants of Health   Financial Resource Strain: Low Risk  (11/04/2022)   Overall Financial Resource Strain (CARDIA)    Difficulty of Paying Living Expenses: Not hard at all  Food Insecurity: No Food Insecurity (11/04/2022)   Hunger Vital Sign    Worried About Running Out of Food in the Last Year: Never true    [place] in the Last Year: Never true  Transportation Needs: No Transportation Needs (11/04/2022)   PRAPARE - Hydrologist (Medical): No    Lack of Transportation (Non-Medical): No  Physical Activity: Sufficiently Active (11/04/2022)   Exercise Vital Sign    Days of Exercise per Week: 5 days    Minutes of Exercise per Session: 30 min  Stress: No Stress Concern Present (11/04/2022)   [place]    Feeling of Stress : Not at all  Social Connections: Not on file    Tobacco Counseling Counseling given: Not Answered   Clinical Intake:  Pre-visit preparation completed: Yes  Pain : 0-10 Pain Score: 8  Pain Type: Chronic pain Pain Location: Hip Pain Orientation: Left Pain Descriptors / Indicators: Sharp Pain Onset: More than a month ago Pain Frequency:  Intermittent     Nutritional Status: BMI 25 -29 Overweight Nutritional Risks: None Diabetes: Yes  How often do you need to have someone help you when you read instructions, pamphlets, or other written materials from your doctor or pharmacy?: 1 - Never  Diabetic? Yes Nutrition Risk Assessment:  Has the patient had any N/V/D within the last 2 months?  No  Does the patient have any non-healing wounds?  No  Has the patient  had any unintentional weight loss or weight gain?  No   Diabetes:  Is the patient diabetic?  Yes  If diabetic, was a CBG obtained today?  No  Did the patient bring in their glucometer from home?  No  How often do you monitor your CBG's? Does not.   Financial Strains and Diabetes Management:  Are you having any financial strains with the device, your supplies or your medication? No .  Does the patient want to be seen by Chronic Care Management for management of their diabetes?  No  Would the patient like to be referred to a Nutritionist or for Diabetic Management?  No   Diabetic Exams:  Diabetic Eye Exam: Completed 02/24/2022 Diabetic Foot Exam: Completed 12/22/2021   Interpreter Needed?: No  Information entered by :: NAllen LPN   Activities of Daily Living    11/04/2022    3:22 PM  In your present state of health, do you have any difficulty performing the following activities:  Hearing? 0  Vision? 1  Comment blind in right eye  Difficulty concentrating or making decisions? 0  Walking or climbing stairs? 0  Dressing or bathing? 0  Doing errands, shopping? 0  Preparing Food and eating ? N  Using the Toilet? N  In the past six months, have you accidently leaked urine? N  Do you have problems with loss of bowel control? N  Managing your Medications? N  Managing your Finances? N  Housekeeping or managing your Housekeeping? N    Patient Care Team: Dorothyann Peng, MD as PCP - General (Internal Medicine) Georgeanna Lea, MD as PCP - Cardiology  (Cardiology) Holli Humbles, MD as Referring Physician (Ophthalmology)  Indicate any recent Medical Services you may have received from other than [facility] providers in the past year (date may be approximate).     Assessment:   This is a routine wellness examination for Steven [name].  Hearing/Vision screen Vision Screening - Comments:: Regular eye exams, [facility]  Dietary issues and exercise activities discussed: Current Exercise Habits: Home exercise routine, Type of exercise: walking, Time (Minutes): 30, Frequency (Times/Week): 5, Weekly Exercise (Minutes/Week): 150   Goals Addressed             This Visit's Progress    Patient Stated       11/04/2022, no goals       Depression Screen    11/04/2022    3:22 PM 10/14/2021    3:18 PM 01/22/2021    9:43 AM 09/02/2020    9:01 AM 02/18/2020    9:45 AM 01/10/2020   11:11 AM 03/19/2019   11:30 AM  PHQ 2/9 Scores  PHQ - 2 Score 0 0 0 0 0 0 0  PHQ- 9 Score     0 0     Fall Risk    11/04/2022    3:22 PM 10/14/2021    3:18 PM 01/22/2021    9:43 AM 02/18/2020    9:44 AM 01/10/2020   11:10 AM  Fall Risk   Falls in the past year? 0 0 0 0 0  Number falls in past yr: 0   0   Injury with Fall? 0   0   Risk for fall due to : Medication side effect Medication side effect Medication side effect  Medication side effect  Follow up Falls prevention discussed;Education provided;Falls evaluation completed Falls evaluation completed;Education provided;Falls prevention discussed Falls evaluation completed;Education provided;Falls prevention discussed  Falls evaluation completed;Education provided;Falls prevention discussed  FALL RISK PREVENTION PERTAINING TO THE HOME:  Any stairs in or around the home? Yes  If so, are there any without handrails? No  Home free of loose throw rugs in walkways, pet beds, electrical cords, etc? Yes  Adequate lighting in your home to reduce risk of falls? Yes   ASSISTIVE DEVICES UTILIZED TO PREVENT  FALLS:  Life alert? No  Use of a cane, walker or w/c? No  Grab bars in the bathroom? No  Shower chair or bench in shower? No  Elevated toilet seat or a handicapped toilet? No   TIMED UP AND GO:  Was the test performed? Yes .  Length of time to ambulate 10 feet: 5 sec.   Gait steady and fast without use of assistive device  Cognitive Function:        11/04/2022    3:23 PM 10/14/2021    3:19 PM 01/22/2021    9:44 AM 01/10/2020   11:12 AM 01/04/2019    2:56 PM  6CIT Screen  What Year? 0 points 0 points 0 points 0 points 0 points  What month? 0 points 0 points 0 points 0 points 0 points  What time? 0 points 0 points 0 points 0 points 0 points  Count back from 20 0 points 0 points 0 points 0 points 0 points  Months in reverse 0 points 0 points 0 points 0 points 0 points  Repeat phrase 2 points 2 points 8 points 2 points 0 points  Total Score 2 points 2 points 8 points 2 points 0 points    Immunizations  There is no immunization history on file for this patient.  TDAP status: Due, Education has been provided regarding the importance of this vaccine. Advised may receive this vaccine at local pharmacy or Health Dept. Aware to provide a copy of the vaccination record if obtained from local pharmacy or Health Dept. Verbalized acceptance and understanding.  Flu Vaccine status: Declined, Education has been provided regarding the importance of this vaccine but patient still declined. Advised may receive this vaccine at local pharmacy or Health Dept. Aware to provide a copy of the vaccination record if obtained from local pharmacy or Health Dept. Verbalized acceptance and understanding.  Pneumococcal vaccine status: Declined,  Education has been provided regarding the importance of this vaccine but patient still declined. Advised may receive this vaccine at local pharmacy or Health Dept. Aware to provide a copy of the vaccination record if obtained from local pharmacy or Health Dept. Verbalized  acceptance and understanding.   Covid-19 vaccine status: Declined, Education has been provided regarding the importance of this vaccine but patient still declined. Advised may receive this vaccine at local pharmacy or Health Dept.or vaccine clinic. Aware to provide a copy of the vaccination record if obtained from local pharmacy or Health Dept. Verbalized acceptance and understanding.  Qualifies for Shingles Vaccine? Yes   Zostavax completed No   Shingrix Completed?: No.    Education has been provided regarding the importance of this vaccine. Patient has been advised to call insurance company to determine out of pocket expense if they have not yet received this vaccine. Advised may also receive vaccine at local pharmacy or Health Dept. Verbalized acceptance and understanding.  Screening Tests Health Maintenance  Topic Date Due   DTaP/Tdap/Td (1 - Tdap) Never done   Medicare Annual Wellness (AWV)  10/14/2022   COVID-19 Vaccine (1) 11/20/2022 (Originally 05/27/1961)   INFLUENZA VACCINE  01/09/2023 (Originally 05/11/2022)   Zoster Vaccines- Shingrix (1  of 2) 02/03/2023 (Originally 05/27/1975)   Pneumonia Vaccine 63+ Years old (1 - PCV) 11/05/2023 (Originally 05/28/1962)   HEMOGLOBIN A1C  11/06/2022   FOOT EXAM  12/23/2022   OPHTHALMOLOGY EXAM  02/25/2023   Diabetic kidney evaluation - eGFR measurement  05/07/2023   Diabetic kidney evaluation - Urine ACR  05/07/2023   COLONOSCOPY (Pts 45-17yrs Insurance coverage will need to be confirmed)  06/28/2032   Hepatitis C Screening  Completed   HIV Screening  Completed   HPV VACCINES  Aged Out    Health Maintenance  Health Maintenance Due  Topic Date Due   DTaP/Tdap/Td (1 - Tdap) Never done   Medicare Annual Wellness (AWV)  10/14/2022    Colorectal cancer screening: Type of screening: Colonoscopy. Completed 06/28/2022. Repeat every 10 years  Lung Cancer Screening: (Low Dose CT Chest recommended if Age 28-80 years, 30 pack-year currently smoking  OR have quit w/in 15years.) does not qualify.   Lung Cancer Screening Referral: no  Additional Screening:  Hepatitis C Screening: does qualify; Completed 05/02/2021  Vision Screening: Recommended annual ophthalmology exams for early detection of glaucoma and other disorders of the eye. Is the patient up to date with their annual eye exam?  Yes  Who is the provider or what is the name of the office in which the patient attends annual eye exams? [place] If pt is not established with a provider, would they like to be referred to a provider to establish care? No .   Dental Screening: Recommended annual dental exams for proper oral hygiene  Community Resource Referral / Chronic Care Management: CRR required this visit?  No   CCM required this visit?  No      Plan:     I have personally reviewed and noted the following in the patient's chart:   Medical and social history Use of alcohol, tobacco or illicit drugs  Current medications and supplements including opioid prescriptions. Patient is not currently taking opioid prescriptions. Functional ability and status Nutritional status Physical activity Advanced directives List of other physicians Hospitalizations, surgeries, and ER visits in previous 12 months Vitals Screenings to include cognitive, depression, and falls Referrals and appointments  In addition, I have reviewed and discussed with patient certain preventive protocols, quality metrics, and best practice recommendations. A written personalized care plan for preventive services as well as general preventive health recommendations were provided to patient.     Kellie Simmering, LPN   9/[id number]   Nurse Notes: none

## 2022-11-05 LAB — CMP14+EGFR
ALT: 16 IU/L (ref 0–44)
AST: 15 IU/L (ref 0–40)
Albumin/Globulin Ratio: 1.6 (ref 1.2–2.2)
Albumin: 4.5 g/dL (ref 3.9–4.9)
Alkaline Phosphatase: 55 IU/L (ref 44–121)
BUN/Creatinine Ratio: 11 (ref 10–24)
BUN: 12 mg/dL (ref 8–27)
Bilirubin Total: 1.4 mg/dL — ABNORMAL HIGH (ref 0.0–1.2)
CO2: 24 mmol/L (ref 20–29)
Calcium: 9.5 mg/dL (ref 8.6–10.2)
Chloride: 104 mmol/L (ref 96–106)
Creatinine, Ser: 1.11 mg/dL (ref 0.76–1.27)
Globulin, Total: 2.8 g/dL (ref 1.5–4.5)
Glucose: 98 mg/dL (ref 70–99)
Potassium: 4.8 mmol/L (ref 3.5–5.2)
Sodium: 140 mmol/L (ref 134–144)
Total Protein: 7.3 g/dL (ref 6.0–8.5)
eGFR: 74 mL/min/{1.73_m2} (ref >59)

## 2022-11-05 LAB — HEMOGLOBIN A1C
Est. average glucose Bld gHb Est-mCnc: 131 mg/dL
Hgb A1c MFr Bld: 6.2 % — ABNORMAL HIGH (ref 4.8–5.6)

## 2022-11-05 LAB — CBC
Hematocrit: 42 % (ref 37.5–51.0)
Hemoglobin: 14.2 g/dL (ref 13.0–17.7)
MCH: 30.9 pg (ref 26.6–33.0)
MCHC: 33.8 g/dL (ref 31.5–35.7)
MCV: 92 fL (ref 79–97)
Platelets: 185 10*3/uL (ref 150–450)
RBC: 4.59 x10E6/uL (ref 4.14–5.80)
RDW: 12.9 % (ref 11.6–15.4)
WBC: 3.8 10*3/uL (ref 3.4–10.8)

## 2022-11-05 LAB — VITAMIN D 25 HYDROXY (VIT D DEFICIENCY, FRACTURES): Vit D, 25-Hydroxy: 40.6 ng/mL (ref 30.0–100.0)

## 2022-11-06 LAB — MICROALBUMIN / CREATININE URINE RATIO
Creatinine, Urine: 119.7 mg/dL
Microalb/Creat Ratio: 3 mg/g creat (ref 0–29)
Microalbumin, Urine: 3.1 ug/mL

## 2022-11-09 DIAGNOSIS — H209 Unspecified iridocyclitis: Secondary | ICD-10-CM | POA: Diagnosis not present

## 2022-11-09 DIAGNOSIS — Z947 Corneal transplant status: Secondary | ICD-10-CM | POA: Diagnosis not present

## 2022-11-09 DIAGNOSIS — H44521 Atrophy of globe, right eye: Secondary | ICD-10-CM | POA: Diagnosis not present

## 2022-11-09 DIAGNOSIS — H4042X2 Glaucoma secondary to eye inflammation, left eye, moderate stage: Secondary | ICD-10-CM | POA: Diagnosis not present

## 2022-11-09 DIAGNOSIS — H402224 Chronic angle-closure glaucoma, left eye, indeterminate stage: Secondary | ICD-10-CM | POA: Diagnosis not present

## 2022-11-09 DIAGNOSIS — H3581 Retinal edema: Secondary | ICD-10-CM | POA: Diagnosis not present

## 2022-11-09 DIAGNOSIS — H30033 Focal chorioretinal inflammation, peripheral, bilateral: Secondary | ICD-10-CM | POA: Diagnosis not present

## 2022-11-09 DIAGNOSIS — H35033 Hypertensive retinopathy, bilateral: Secondary | ICD-10-CM | POA: Diagnosis not present

## 2022-11-09 DIAGNOSIS — Z79899 Other long term (current) drug therapy: Secondary | ICD-10-CM | POA: Diagnosis not present

## 2022-11-15 ENCOUNTER — Ambulatory Visit (INDEPENDENT_AMBULATORY_CARE_PROVIDER_SITE_OTHER): Payer: 59

## 2022-11-15 ENCOUNTER — Encounter: Payer: Self-pay | Admitting: Physician Assistant

## 2022-11-15 ENCOUNTER — Ambulatory Visit (INDEPENDENT_AMBULATORY_CARE_PROVIDER_SITE_OTHER): Payer: 59 | Admitting: Physician Assistant

## 2022-11-15 DIAGNOSIS — M25552 Pain in left hip: Secondary | ICD-10-CM

## 2022-11-15 MED ORDER — PREDNISONE 10 MG (21) PO TBPK: ORAL_TABLET | ORAL | 0 refills | Status: DC

## 2022-11-15 MED ORDER — METHOCARBAMOL 750 MG PO TABS: 750.0000 mg | ORAL_TABLET | Freq: Two times a day (BID) | ORAL | 2 refills | Status: DC | PRN

## 2022-11-15 NOTE — Progress Notes (Signed)
Office Visit Note   Patient: Steven [name]           Date of Birth: [birth year]           MRN: [id number] Visit Date: 11/15/2022              Requested by: Glendale Chard, [address] [place],  [place] [address] PCP: Glendale Chard, MD   Assessment & Plan: Visit Diagnoses:  1. Pain in left hip     Plan: Impression is left hip pain likely referred from the lumbar spine.  I would like to start the patient on a steroid pack and muscle relaxer as well as start him on a course of physical therapy.  He is agreeable to this plan.  Referral has been made.  He will follow-up with [place] as needed.  Call with concerns or questions.  Follow-Up Instructions: Return if symptoms worsen or fail to improve.   Orders:  Orders Placed This Encounter  Procedures   XR HIP UNILAT W OR W/O PELVIS 2-3 VIEWS LEFT   XR Lumbar Spine 2-3 Views   No orders of the defined types were placed in this encounter.     Procedures: No procedures performed   Clinical Data: No additional findings.   Subjective: Chief Complaint  Patient presents with   Left Hip - Pain    HPI patient is a very pleasant 67 year old gentleman who comes in today with left hip pain for the past 6 months.  He denies any injury or change in activity.  The pain he has is the lateral hip and radiates down the lateral leg and into the foot.  He denies any groin or anterior thigh pain.  No buttock or lower back pain.  Symptoms are worse with walking.  He denies any paresthesias to the left lower extremity.  He has been taking Tylenol without relief.  No history of lumbar pathology.  Review of Systems as detailed in HPI.  All others reviewed and are negative.   Objective: Vital Signs: There were no vitals taken for this visit.  Physical Exam well-developed well-nourished gentleman in no acute distress.  Alert and oriented x 3.  Ortho Exam left hip exam shows no pain with logroll, FADIR or [place].  He does have  a positive straight leg raise on the left.  No tenderness to greater trochanter.  No pain with lumbar flexion or extension.  No spinous paraspinous tenderness.  He is neurovascular intact distally.  Specialty Comments:  No specialty comments available.  Imaging: XR HIP UNILAT W OR W/O PELVIS 2-3 VIEWS LEFT  Result Date: 11/15/2022 X-rays demonstrate significant osteophyte formation to the acetabulum.  Mild joint space narrowing otherwise.    PMFS History: Patient Active Problem List   Diagnosis Date Noted   Mixed hyperlipidemia 09/14/2022   BMI 25.0-25.9,adult 05/07/2022   Abnormal weight loss 03/31/2021   Hemorrhoids 03/31/2021   Fl[place] 03/31/2021   Encounter for general adult medical examination without abnormal findings 03/31/2021   Impaired functional mobility, balance, gait, and endurance 03/31/2021   Other long term (current) drug therapy 03/31/2021   Second degree hemorrhoids 03/31/2021   Type 2 diabetes mellitus with diabetic neuropathy ([place]) 03/31/2021   Diabetic mononeuropathy associated with type 2 diabetes mellitus ([place]) 08/21/2019   Benign hypertensive heart disease without heart failure 08/21/2019   Coronary atherosclerosis due to calcified coronary lesion (CODE) 08/21/2019   Cervical radiculopathy at C6 11/17/2016   Unexplained night sweats 09/02/2015  Focal choroiditis and chorioretinitis, peripheral, bilateral 08/05/2015   Phthisis bulbi of right eye 06/17/2015   Hypertensive heart disease without heart failure 06/17/2015   Absolute glaucoma of left eye 06/17/2015   High risk medication use 04/27/2013   Aftercare following surgery of the musculoskeletal system, NEC 01/10/2013   Status post cervical arthrodesis 12/13/2012   Diabetes mellitus type 2 in nonobese ([place]) 07/06/2012   Immunosuppression ([place]) 01/11/2012   Chronic idiopathic constipation 12/21/2011   Chest pain at rest 12/18/2011   CAD s/p LAD stent 2007 12/18/2011   Glaucoma, 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 eye with blurred vision 12/18/2011   Cystoid macular edema 09/14/2011   Panuveitis of both eyes 09/14/2011   Sarcoidosis of other sites 09/14/2011   Other mechanical complication of other ocular prosthetic devices, implants and grafts, initial encounter 07/28/2011   Past Medical History:  Diagnosis Date   Blindness, legal 12/18/2011   CAD (coronary artery disease), known LAD stent placed in 2007 in [place]. 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 eye with blurred vision 12/18/2011   HTN (hypertension) 12/18/2011   Hypertension    Mixed hyperlipidemia 09/14/2022   Rectal bleeding 03/31/2021    Family History  Problem Relation Age of Onset   Heart attack Mother    Diabetes Mother    Hypertension Father     Past Surgical History:  Procedure Laterality Date   CARDIAC SURGERY     stent placed in 2007   [place]  2008   STENT PLACED IN LAD.MILD DIFFUSE IN-STENT RESTENOSIS IN 2010.   CORONARY STENT PLACEMENT     MYOCARDIAL PERFUSION STUDY  01/19/13   NORMAL STRESS NUCLEAR STUDY.   TRANSTHORACIC ECHOCARDIOGRAM  07/28/2009   LV= MILD LVH. EF 60% TO 65%.   VASCULAR US  01/15/09   NORMAL LOWER ARTERIAL DOPPLER   Social History   Occupational History   Occupation: disability  Tobacco Use   Smoking status: Former    Packs/day: 0.75    Years: 20.00    Total pack years: 15.00    Types: Cigarettes    Start date: 10/11/1981    Quit date: 10/11/2001    Years since quitting: 21.1   Smokeless tobacco: Never  Vaping Use   Vaping Use:  Never used  Substance and Sexual Activity   Alcohol use: No    Alcohol/week: 0.0 standard drinks of alcohol   Drug use: Not Currently   Sexual activity: Yes

## 2022-11-30 NOTE — Therapy (Unsigned)
OUTPATIENT PHYSICAL THERAPY THORACOLUMBAR EVALUATION   Patient Name: Steven [name] MRN: [id number] DOB:[birth year], 67 y.o., male Today's Date: 12/01/2022  END OF SESSION:  PT End of Session - 12/01/22 1444     Visit Number 1    Number of Visits 12    Date for PT Re-Evaluation 01/26/23    Authorization Type UHC MCR    PT Start Time 1400    PT Stop Time [id number]    PT Time Calculation (min) 45 min    Activity Tolerance Patient tolerated treatment well    Behavior During Therapy WFL for tasks assessed/performed             Past Medical History:  Diagnosis Date   Blindness, legal 12/18/2011   CAD (coronary artery disease), known LAD stent placed in 2007 in [place]. 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 eye with blurred vision 12/18/2011   HTN (hypertension) 12/18/2011   Hypertension    Mixed hyperlipidemia 09/14/2022   Rectal bleeding 03/31/2021   Past Surgical History:  Procedure Laterality Date   CARDIAC SURGERY     stent placed in 2007   [place]  2008   STENT PLACED IN LAD.MILD DIFFUSE IN-STENT RESTENOSIS IN 2010.   CORONARY STENT PLACEMENT     MYOCARDIAL PERFUSION STUDY  01/19/13   NORMAL STRESS NUCLEAR STUDY.   TRANSTHORACIC ECHOCARDIOGRAM  07/28/2009   LV= MILD LVH. EF 60% TO 65%.   VASCULAR US  01/15/09   NORMAL LOWER ARTERIAL DOPPLER   Patient Active Problem List   Diagnosis Date Noted   Mixed hyperlipidemia 09/14/2022   BMI 25.0-25.9,adult 05/07/2022   Abnormal weight loss 03/31/2021   Hemorrhoids 03/31/2021   Flushing 03/31/2021   Encounter for general adult medical examination without abnormal findings 03/31/2021   Impaired functional mobility, balance, gait, and endurance 03/31/2021   Other long term (current)  drug therapy 03/31/2021   Second degree hemorrhoids 03/31/2021   Type 2 diabetes mellitus with diabetic neuropathy ([place]) 03/31/2021   Diabetic mononeuropathy associated with type 2 diabetes mellitus ([place]) 08/21/2019   Benign hypertensive heart disease without heart failure 08/21/2019   Coronary atherosclerosis due to calcified coronary lesion (CODE) 08/21/2019   Cervical radiculopathy at C6 11/17/2016   Unexplained night sweats 09/02/2015   Focal choroiditis and chorioretinitis, peripheral, bilateral 08/05/2015   Phthisis bulbi of right eye 06/17/2015   Hypertensive heart disease without heart failure 06/17/2015   Absolute glaucoma of left eye 06/17/2015   High risk medication use 04/27/2013   Aftercare following surgery of the musculoskeletal system, NEC 01/10/2013   Status post cervical arthrodesis 12/13/2012   Diabetes mellitus type 2 in nonobese ([place]) 07/06/2012   Immunosuppression ([place]) 01/11/2012   Chronic idiopathic constipation 12/21/2011   Chest pain at rest 12/18/2011   CAD s/p LAD stent 2007 12/18/2011   Glaucoma, 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 eye with blurred vision 12/18/2011   Cystoid macular edema 09/14/2011   Panuveitis of both eyes 09/14/2011   Sarcoidosis of other sites 09/14/2011   Other  mechanical complication of other ocular prosthetic devices, implants and grafts, initial encounter 07/28/2011    PCP: Glendale Chard, MD   REFERRING PROVIDER: Aundra Dubin, PA-C  REFERRING DIAG: [phone] (ICD-10-CM) - Pain in left hip  Rationale for Evaluation and Treatment: Rehabilitation  THERAPY DIAG: Pain in left hip  ONSET DATE: 6 months  SUBJECTIVE:                                                                                                                                                                                            SUBJECTIVE STATEMENT: Describes L hip pain extending to his L foot/ankle.    PERTINENT HISTORY:  HPI patient is a very pleasant 67 year old gentleman who comes in today with left hip pain for the past 6 months.  He denies any injury or change in activity.  The pain he has is the lateral hip and radiates down the lateral leg and into the foot.  He denies any groin or anterior thigh pain.  No buttock or lower back pain.  Symptoms are worse with walking.  He denies any paresthesias to the left lower extremity.  He has been taking Tylenol without relief.  No history of lumbar pathology.  PAIN:  Are you having pain? Yes: NPRS scale: 10/10 Pain location: L hip LE Pain description: ache Aggravating factors: undetermined Relieving factors: lying down  PRECAUTIONS: None  WEIGHT BEARING RESTRICTIONS: No  FALLS:  Has patient fallen in last 6 months? No  LIVING ENVIRONMENT: Lives with: lives with their family Lives in: House/apartment Stairs: Yes: Internal: 13 steps; yes Has following equipment at home: None  OCCUPATION: not working  PLOF: Independent  PATIENT GOALS: To reduce my pain and return to walking  NEXT MD VISIT: PRN  OBJECTIVE:   DIAGNOSTIC FINDINGS:  Degenerative changes L5-S1 X-rays demonstrate significant osteophyte formation to the acetabulum.   Mild joint space narrowing otherwise.  PATIENT SURVEYS:  FOTO UTA due to poor vision  SCREENING FOR RED FLAGS: neg    MUSCLE LENGTH: Hamstrings: Right 45 deg; Left 45 deg   POSTURE: No Significant postural limitations  PALPATION: Marked TTP L piriformis muscle  LUMBAR ROM:   AROM eval  Flexion   Extension   Right lateral flexion   Left lateral flexion   Right rotation   Left rotation    (Blank rows = not tested)  LOWER EXTREMITY ROM:     Passive  Right eval Left eval  Hip flexion    Hip extension    Hip abduction    Hip  adduction    Hip internal rotation  0d  Hip external rotation  Knee flexion    Knee extension    Ankle dorsiflexion    Ankle plantarflexion    Ankle inversion    Ankle eversion     (Blank rows = not tested)  LOWER EXTREMITY MMT:    MMT Right eval Left eval  Hip flexion 4 4  Hip extension 4 4  Hip abduction 4 4  Hip adduction    Hip internal rotation    Hip external rotation    Knee flexion 4 4  Knee extension 4 4  Ankle dorsiflexion    Ankle plantarflexion 4 4  Ankle inversion    Ankle eversion     (Blank rows = not tested)  LUMBAR SPECIAL TESTS:  Straight leg raise test: Negative, Slump test: Negative, and Single leg stance test: NT  FUNCTIONAL TESTS:  5 times sit to stand: 13s arms crossed  GAIT: Distance walked: 29fx2 Assistive device utilized: None Level of assistance: Complete Independence Comments: antalgic pattern  TODAY'S TREATMENT:                                                                                                                              DATE: 12/01/22 Eval and HEP    PATIENT EDUCATION:  Education details: Discussed eval findings, rehab rationale and POC and patient is in agreement  Person educated: Patient Education method: Explanation and Handouts Education comprehension: verbalized understanding and needs further education  HOME EXERCISE PROGRAM: Access Code: [email or web address]: [email or web address] Date: 12/01/2022 Prepared by: JSharlynn Oliphant Exercises - Clamshell  - 2 x daily - 5 x weekly - 1 sets - 15 reps - Seated Table Hamstring Stretch  - 2 x daily - 5 x weekly - 1 sets - 2 reps - 30s hold - Hooklying Single Knee to Chest Stretch  - 2 x daily - 5 x weekly - 1 sets - 2 reps - 30s hold  ASSESSMENT:  CLINICAL IMPRESSION: Patient is a 67y.o. male who was seen today for physical therapy evaluation and treatment for L hip pain and piriformis tenderness. Patient presents with marked tenderness to L  piriformis, limited L hip IR and marked hamstring tightness.  5x STS score functional, no neuro tension signs elicited.  Patient presents with global tightness throughout, complicating assessment.  OBJECTIVE IMPAIRMENTS: Abnormal gait, decreased endurance, decreased knowledge of condition, decreased mobility, difficulty walking, decreased ROM, decreased strength, hypomobility, increased fascial restrictions, increased muscle spasms, impaired flexibility, improper body mechanics, and pain.   ACTIVITY LIMITATIONS: carrying, lifting, bending, sitting, squatting, and stairs  PERSONAL FACTORS: Age and Time since onset of injury/illness/exacerbation are also affecting patient's functional outcome.   REHAB POTENTIAL: Good  CLINICAL DECISION MAKING: Stable/uncomplicated  EVALUATION COMPLEXITY: Low   GOALS: Goals reviewed with patient? No  SHORT TERM GOALS: Target date: 12/22/2022  Patient to demonstrate independence in HEP  Baseline: [id number] Goal status: INITIAL  2.  Decrease worst pain to 8/10 Baseline: 10/10 Goal status: INITIAL  3. Assess FOTO  Baseline: TBD due to poor vision  Goal status: INITIAL   LONG TERM GOALS: Target date: 01/12/2023  6/10 worst pain Baseline: 10/10 Goal status: INITIAL  2.  60d SLR B Baseline: 45d SLR B Goal status: INITIAL  3.  Decrease L piriformis tenderness to mild Baseline: severe Goal status: INITIAL  4.  Increase L hip IR to 10d  Baseline: 0d Goal status: INITIAL  5.  Assess progress to FOTO goal Baseline: TBD Goal status: INITIAL    PLAN:  PT FREQUENCY: 2x/week  PT DURATION: 6 weeks  PLANNED INTERVENTIONS: Therapeutic exercises, Therapeutic activity, Neuromuscular re-education, Balance training, Gait training, Patient/Family education, Self Care, Joint mobilization, Stair training, Dry Needling, Electrical stimulation, Manual therapy, and Re-evaluation.  PLAN FOR NEXT SESSION: HEP review and update, LE stretch and strength  TPDN, manual as needed, flexibility   Lanice Shirts, PT 12/01/2022, 3:54 PM

## 2022-12-01 ENCOUNTER — Ambulatory Visit: Payer: 59 | Attending: Physician Assistant

## 2022-12-01 ENCOUNTER — Other Ambulatory Visit: Payer: Self-pay

## 2022-12-01 DIAGNOSIS — G5702 Lesion of sciatic nerve, left lower limb: Secondary | ICD-10-CM | POA: Diagnosis not present

## 2022-12-01 DIAGNOSIS — M5459 Other low back pain: Secondary | ICD-10-CM | POA: Insufficient documentation

## 2022-12-01 DIAGNOSIS — M5416 Radiculopathy, lumbar region: Secondary | ICD-10-CM | POA: Insufficient documentation

## 2022-12-01 DIAGNOSIS — M25552 Pain in left hip: Secondary | ICD-10-CM | POA: Insufficient documentation

## 2022-12-14 ENCOUNTER — Ambulatory Visit: Payer: 59 | Attending: Physician Assistant

## 2022-12-14 DIAGNOSIS — G5702 Lesion of sciatic nerve, left lower limb: Secondary | ICD-10-CM | POA: Insufficient documentation

## 2022-12-14 DIAGNOSIS — M5416 Radiculopathy, lumbar region: Secondary | ICD-10-CM | POA: Diagnosis not present

## 2022-12-14 DIAGNOSIS — M5459 Other low back pain: Secondary | ICD-10-CM | POA: Insufficient documentation

## 2022-12-14 NOTE — Therapy (Signed)
OUTPATIENT PHYSICAL THERAPY TREATMENT NOTE   Patient Name: Steven [name] MRN: [id number] DOB:17-Feb-1956, 67 y.o., male Today's Date: 12/14/2022  PCP: Glendale Chard, MD  REFERRING PROVIDER: Aundra Dubin, PA-C   END OF SESSION:   PT End of Session - 12/14/22 1507     Visit Number 2    Number of Visits 12    Date for PT Re-Evaluation 01/26/23    Authorization Type U[employer] MCR    PT Start Time 1520    PT Stop Time 1558    PT Time Calculation (min) 38 min    Activity Tolerance Patient tolerated treatment well    Behavior During Therapy WFL for tasks assessed/performed             Past Medical History:  Diagnosis Date   Blindness, legal 12/18/2011   CAD (coronary artery disease), known LAD stent placed in 2007 in [place]. 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 eye with blurred vision 12/18/2011   HTN (hypertension) 12/18/2011   Hypertension    Mixed hyperlipidemia 09/14/2022   Rectal bleeding 03/31/2021   Past Surgical History:  Procedure Laterality Date   CARDIAC SURGERY     stent placed in 2007   [place]  2008   STENT PLACED IN LAD.MILD DIFFUSE IN-STENT RESTENOSIS IN 2010.   CORONARY STENT PLACEMENT     MYOCARDIAL PERFUSION STUDY  01/19/13   NORMAL STRESS NUCLEAR STUDY.   TRANSTHORACIC ECHOCARDIOGRAM  07/28/2009   LV= MILD LVH. EF 60% TO 65%.   VASCULAR US  01/15/09   NORMAL LOWER ARTERIAL DOPPLER   Patient Active Problem List   Diagnosis Date Noted   Mixed hyperlipidemia 09/14/2022   BMI 25.0-25.9,adult 05/07/2022   Abnormal weight loss 03/31/2021   Hemorrhoids 03/31/2021   Flushing 03/31/2021   Encounter for general adult medical examination without abnormal findings 03/31/2021   Impaired functional mobility,  balance, gait, and endurance 03/31/2021   Other long term (current) drug therapy 03/31/2021   Second degree hemorrhoids 03/31/2021   Type 2 diabetes mellitus with diabetic neuropathy ([place]) 03/31/2021   Diabetic mononeuropathy associated with type 2 diabetes mellitus ([place]) 08/21/2019   Benign hypertensive heart disease without heart failure 08/21/2019   Coronary atherosclerosis due to calcified coronary lesion (CODE) 08/21/2019   Cervical radiculopathy at C6 11/17/2016   Unexplained night sweats 09/02/2015   Focal choroiditis and chorioretinitis, peripheral, bilateral 08/05/2015   Phthisis bulbi of right eye 06/17/2015   Hypertensive heart disease without heart failure 06/17/2015   Absolute glaucoma of left eye 06/17/2015   High risk medication use 04/27/2013   Aftercare following surgery of the musculoskeletal system, NEC 01/10/2013   Status post cervical arthrodesis 12/13/2012   Diabetes mellitus type 2 in nonobese ([place]) 07/06/2012   Immunosuppression ([place]) 01/11/2012   Chronic idiopathic constipation 12/21/2011   Chest pain at rest 12/18/2011   CAD s/p LAD stent 2007 12/18/2011   Glaucoma, 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 eye with blurred vision 12/18/2011   Cystoid macular edema 09/14/2011   Panuveitis  of both eyes 09/14/2011   Sarcoidosis of other sites 09/14/2011   Other mechanical complication of other ocular prosthetic devices, implants and grafts, initial encounter 07/28/2011    REFERRING DIAG: M25.552 (ICD-10-CM) - Pain in left hip   THERAPY DIAG:  Other low back pain  Radiculopathy, lumbar region  Piriformis syndrome of left side  Rationale for Evaluation and Treatment Rehabilitation  PERTINENT HISTORY: Visual impairments  PRECAUTIONS: None   SUBJECTIVE:                                                                                                                                                                                       SUBJECTIVE STATEMENT:  Pt presents to PT with reports of continued L hip and L LE pain. Has been compliant with initial HEP with no adverse effect.    PAIN:  Are you having pain?  Yes: NPRS scale: 10/10 Pain location: L hip LE Pain description: ache Aggravating factors: undetermined Relieving factors: lying down   OBJECTIVE: (objective measures completed at initial evaluation unless otherwise dated)  DIAGNOSTIC FINDINGS:  Degenerative changes L5-S1 X-rays demonstrate significant osteophyte formation to the acetabulum.   Mild joint space narrowing otherwise.   PATIENT SURVEYS:  FOTO UTA due to poor vision   SCREENING FOR RED FLAGS: neg       MUSCLE LENGTH: Hamstrings: Right 45 deg; Left 45 deg     POSTURE: No Significant postural limitations   PALPATION: Marked TTP L piriformis muscle   LUMBAR ROM:    AROM eval  Flexion    Extension    Right lateral flexion    Left lateral flexion    Right rotation    Left rotation     (Blank rows = not tested)   LOWER EXTREMITY ROM:      Passive  Right eval Left eval  Hip flexion      Hip extension      Hip abduction      Hip adduction      Hip internal rotation   0d  Hip external rotation      Knee flexion      Knee extension      Ankle dorsiflexion      Ankle plantarflexion      Ankle inversion      Ankle eversion       (Blank rows = not tested)   LOWER EXTREMITY MMT:     MMT Right eval Left eval  Hip flexion 4 4  Hip extension 4 4  Hip abduction 4 4  Hip adduction      Hip internal rotation      Hip external rotation  Knee flexion 4 4  Knee extension 4 4  Ankle dorsiflexion      Ankle plantarflexion 4 4  Ankle inversion      Ankle eversion       (Blank rows = not tested)   LUMBAR SPECIAL TESTS:  Straight leg raise test:  Negative, Slump test: Negative, and Single leg stance test: NT   FUNCTIONAL TESTS:  5 times sit to stand: 13s arms crossed   GAIT: Distance walked: 54fx2 Assistive device utilized: None Level of assistance: Complete Independence Comments: antalgic pattern   TREATMENT: [facility] Adult PT Treatment:                                                DATE: 12/14/2022 Therapeutic Exercise: NuStep lvl 5 UE/LE x 5 min while taking subjective LTR x 10 each Supine piriformis 2x30" L Bridge 2x10  S/L clamshell 2x10 RTB Supine SLR 2x10 each Seated fig 4 stretch 2x30" L STS 2x10 - no UE support Standing hip abd 2x10 each  PATIENT EDUCATION:  Education details: Discussed eval findings, rehab rationale and POC and patient is in agreement  Person educated: Patient Education method: Explanation and Handouts Education comprehension: verbalized understanding and needs further education   HOME EXERCISE PROGRAM: Access Code: [email or web address] URL: [email or web address] Date: 12/14/2022 Prepared by: DOctavio Manns Exercises - Clamshell  - 2 x daily - 5 x weekly - 1 sets - 15 reps - Seated Table Hamstring Stretch  - 2 x daily - 5 x weekly - 1 sets - 2 reps - 30s hold - Hooklying Single Knee to Chest Stretch  - 2 x daily - 5 x weekly - 1 sets - 2 reps - 30s hold - Supine Bridge  - 1 x daily - 5 x weekly - 2 sets - 10 reps - Supine Piriformis Stretch with Leg Straight  - 1 x daily - 5 x weekly - 2 reps - 30 sec hold - Seated Piriformis Stretch with Trunk Bend  - 1 x daily - 5 x weekly - 2 reps - 30 sec hold   ASSESSMENT:   CLINICAL IMPRESSION: Pt was able to complete all prescribed exercises with no adverse effect or increase in pain. Therapy focused on improving proximal hip strength and muscle length in order to decrease pain and improve comfort. HEP update, pt is progressing as expected. Will continue per POC.    OBJECTIVE IMPAIRMENTS: Abnormal gait, decreased endurance, decreased knowledge of  condition, decreased mobility, difficulty walking, decreased ROM, decreased strength, hypomobility, increased fascial restrictions, increased muscle spasms, impaired flexibility, improper body mechanics, and pain.    ACTIVITY LIMITATIONS: carrying, lifting, bending, sitting, squatting, and stairs   PERSONAL FACTORS: Age and Time since onset of injury/illness/exacerbation are also affecting patient's functional outcome.      GOALS: Goals reviewed with patient? No   SHORT TERM GOALS: Target date: 12/22/2022   Patient to demonstrate independence in HEP  Baseline: [id number] Goal status: INITIAL   2.  Decrease worst pain to 8/10 Baseline: 10/10 Goal status: INITIAL   3. Assess FOTO            Baseline: TBD due to poor vision            Goal status: INITIAL     LONG TERM GOALS: Target date: 01/12/2023   6/10 worst pain Baseline: 10/10 Goal status: INITIAL  2.  60d SLR B Baseline: 45d SLR B Goal status: INITIAL   3.  Decrease L piriformis tenderness to mild Baseline: severe Goal status: INITIAL   4.  Increase L hip IR to 10d  Baseline: 0d Goal status: INITIAL   5.  Assess progress to FOTO goal Baseline: TBD Goal status: INITIAL       PLAN:   PT FREQUENCY: 2x/week   PT DURATION: 6 weeks   PLANNED INTERVENTIONS: Therapeutic exercises, Therapeutic activity, Neuromuscular re-education, Balance training, Gait training, Patient/Family education, Self Care, Joint mobilization, Stair training, Dry Needling, Electrical stimulation, Manual therapy, and Re-evaluation.   PLAN FOR NEXT SESSION: HEP review and update, LE stretch and strength TPDN, manual as needed, flexibility   Ward Chatters, PT 12/14/2022, 4:01 PM

## 2022-12-15 NOTE — Therapy (Unsigned)
OUTPATIENT PHYSICAL THERAPY TREATMENT NOTE   Patient Name: Steven [name] MRN: [id number] DOB:1956-09-10, 67 y.o., male Today's Date: 12/16/2022  PCP: Glendale Chard, MD  REFERRING PROVIDER: Aundra Dubin, PA-C   END OF SESSION:   PT End of Session - 12/16/22 1444     Visit Number 3    Number of Visits 12    Date for PT Re-Evaluation 01/26/23    Authorization Type UHC MCR    PT Start Time [id number]    PT Stop Time [id number]    PT Time Calculation (min) 40 min    Activity Tolerance Patient tolerated treatment well    Behavior During Therapy WFL for tasks assessed/performed             Past Medical History:  Diagnosis Date   Blindness, legal 12/18/2011   CAD (coronary artery disease), known LAD stent placed in 2007 in [place]. 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 eye with blurred vision 12/18/2011   HTN (hypertension) 12/18/2011   Hypertension    Mixed hyperlipidemia 09/14/2022   Rectal bleeding 03/31/2021   Past Surgical History:  Procedure Laterality Date   CARDIAC SURGERY     stent placed in 2007   [place]  2008   STENT PLACED IN LAD.MILD DIFFUSE IN-STENT RESTENOSIS IN 2010.   CORONARY STENT PLACEMENT     MYOCARDIAL PERFUSION STUDY  01/19/13   NORMAL STRESS NUCLEAR STUDY.   TRANSTHORACIC ECHOCARDIOGRAM  07/28/2009   LV= MILD LVH. EF 60% TO 65%.   VASCULAR US  01/15/09   NORMAL LOWER ARTERIAL DOPPLER   Patient Active Problem List   Diagnosis Date Noted   Mixed hyperlipidemia 09/14/2022   BMI 25.0-25.9,adult 05/07/2022   Abnormal weight loss 03/31/2021   Hemorrhoids 03/31/2021   Flushing 03/31/2021   Encounter for general adult medical examination without abnormal findings 03/31/2021   Impaired functional mobility,  balance, gait, and endurance 03/31/2021   Other long term (current) drug therapy 03/31/2021   Second degree hemorrhoids 03/31/2021   Type 2 diabetes mellitus with diabetic neuropathy ([place]) 03/31/2021   Diabetic mononeuropathy associated with type 2 diabetes mellitus ([place]) 08/21/2019   Benign hypertensive heart disease without heart failure 08/21/2019   Coronary atherosclerosis due to calcified coronary lesion ([place]) 08/21/2019   Cervical radiculopathy at C6 11/17/2016   Unexplained night sweats 09/02/2015   Focal choroiditis and chorioretinitis, peripheral, bilateral 08/05/2015   Phthisis bulbi of right eye 06/17/2015   Hypertensive heart disease without heart failure 06/17/2015   Absolute glaucoma of left eye 06/17/2015   High risk medication use 04/27/2013   Aftercare following surgery of the musculoskeletal system, NEC 01/10/2013   Status post cervical arthrodesis 12/13/2012   Diabetes mellitus type 2 in nonobese ([place]) 07/06/2012   Immunosuppression ([place]) 01/11/2012   Chronic idiopathic constipation 12/21/2011   Chest pain at rest 12/18/2011   CAD s/p LAD stent 2007 12/18/2011   Glaucoma, 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 eye with blurred vision 12/18/2011   Cystoid macular edema 09/14/2011   Panuveitis  of both eyes 09/14/2011   Sarcoidosis of other sites 09/14/2011   Other mechanical complication of other ocular prosthetic devices, implants and grafts, initial encounter 07/28/2011    REFERRING DIAG: M25.552 (ICD-10-CM) - Pain in left hip   THERAPY DIAG:  Other low back pain  Radiculopathy, lumbar region  Piriformis syndrome of left side  Rationale for Evaluation and Treatment Rehabilitation  PERTINENT HISTORY: Visual impairments  PRECAUTIONS: None   SUBJECTIVE:                                                                                                                                                                                       SUBJECTIVE STATEMENT:  Reports good days/bad days, 6/10 pain today.  Aggravating factors undetermined, symptoms appear at rest at times, notes LLE symptoms with R hamstring stretch(?)   PAIN:  Are you having pain?  Yes: NPRS scale: 10/10 Pain location: L hip LE Pain description: ache Aggravating factors: undetermined Relieving factors: lying down   OBJECTIVE: (objective measures completed at initial evaluation unless otherwise dated)  DIAGNOSTIC FINDINGS:  Degenerative changes L5-S1 X-rays demonstrate significant osteophyte formation to the acetabulum.   Mild joint space narrowing otherwise.   PATIENT SURVEYS:  FOTO UTA due to poor vision   SCREENING FOR RED FLAGS: neg       MUSCLE LENGTH: Hamstrings: Right 45 deg; Left 45 deg     POSTURE: No Significant postural limitations   PALPATION: Marked TTP L piriformis muscle   LUMBAR ROM:    AROM eval  Flexion    Extension    Right lateral flexion    Left lateral flexion    Right rotation    Left rotation     (Blank rows = not tested)   LOWER EXTREMITY ROM:      Passive  Right eval Left eval  Hip flexion      Hip extension      Hip abduction      Hip adduction      Hip internal rotation   0d  Hip external rotation      Knee flexion      Knee extension      Ankle dorsiflexion      Ankle plantarflexion      Ankle inversion      Ankle eversion       (Blank rows = not tested)   LOWER EXTREMITY MMT:     MMT Right eval Left eval  Hip flexion 4 4  Hip extension 4 4  Hip abduction 4 4  Hip adduction      Hip internal rotation      Hip external rotation  Knee flexion 4 4  Knee extension 4 4  Ankle dorsiflexion      Ankle plantarflexion 4 4  Ankle inversion      Ankle eversion       (Blank rows = not tested)   LUMBAR SPECIAL  TESTS:  Straight leg raise test: Negative, Slump test: Negative, and Single leg stance test: NT   FUNCTIONAL TESTS:  5 times sit to stand: 13s arms crossed   GAIT: Distance walked: 73fx2 Assistive device utilized: None Level of assistance: Complete Independence Comments: antalgic pattern   TREATMENT: [facility] Adult PT Treatment:                                                DATE: 12/16/22 Therapeutic Exercise: Seated hamstring stretch 30s x2 Supine QL stretch 30s x2 Bridge with ball 15x Supine march 2# 15/15 L hip flexor stretch SLR 2# 15x Bil Nustep L4 8 min Manual Therapy: L piriformis release R S/L 8 min   [facility] Adult PT Treatment:                                                DATE: 12/14/2022 Therapeutic Exercise: NuStep lvl 5 UE/LE x 5 min while taking subjective LTR x 10 each Supine piriformis 2x30" L Bridge 2x10  S/L clamshell 2x10 RTB Supine SLR 2x10 each Seated fig 4 stretch 2x30" L STS 2x10 - no UE support Standing hip abd 2x10 each  PATIENT EDUCATION:  Education details: Discussed eval findings, rehab rationale and POC and patient is in agreement  Person educated: Patient Education method: Explanation and Handouts Education comprehension: verbalized understanding and needs further education   HOME EXERCISE PROGRAM: Access Code: [email or web address]: [email or web address] Date: 12/14/2022 Prepared by: DOctavio Manns Exercises - Clamshell  - 2 x daily - 5 x weekly - 1 sets - 15 reps - Seated Table Hamstring Stretch  - 2 x daily - 5 x weekly - 1 sets - 2 reps - 30s hold - Hooklying Single Knee to Chest Stretch  - 2 x daily - 5 x weekly - 1 sets - 2 reps - 30s hold - Supine Bridge  - 1 x daily - 5 x weekly - 2 sets - 10 reps - Supine Piriformis Stretch with Leg Straight  - 1 x daily - 5 x weekly - 2 reps - 30 sec hold - Seated Piriformis Stretch with Trunk Bend  - 1 x daily - 5 x weekly - 2 reps - 30 sec hold   ASSESSMENT:   CLINICAL IMPRESSION:  Todays session continued focus on L hip and piriformis strength and stretch,  Added additional stretches to L hip flexors and incorporated manual piriformis release technique.  Felt piriformis release technique was beneficial.  Consider TPDN for upcoming sessions, discussed technique and patient will consider.   OBJECTIVE IMPAIRMENTS: Abnormal gait, decreased endurance, decreased knowledge of condition, decreased mobility, difficulty walking, decreased ROM, decreased strength, hypomobility, increased fascial restrictions, increased muscle spasms, impaired flexibility, improper body mechanics, and pain.    ACTIVITY LIMITATIONS: carrying, lifting, bending, sitting, squatting, and stairs   PERSONAL FACTORS: Age and Time since onset of injury/illness/exacerbation are also affecting patient's functional outcome.      GOALS: Goals reviewed  with patient? No   SHORT TERM GOALS: Target date: 12/22/2022   Patient to demonstrate independence in HEP  Baseline: [id number] Goal status: INITIAL   2.  Decrease worst pain to 8/10 Baseline: 10/10 Goal status: INITIAL   3. Assess FOTO            Baseline: TBD due to poor vision            Goal status: INITIAL     LONG TERM GOALS: Target date: 01/12/2023   6/10 worst pain Baseline: 10/10 Goal status: INITIAL   2.  60d SLR B Baseline: 45d SLR B Goal status: INITIAL   3.  Decrease L piriformis tenderness to mild Baseline: severe Goal status: INITIAL   4.  Increase L hip IR to 10d  Baseline: 0d Goal status: INITIAL   5.  Assess progress to FOTO goal Baseline: TBD Goal status: INITIAL       PLAN:   PT FREQUENCY: 2x/week   PT DURATION: 6 weeks   PLANNED INTERVENTIONS: Therapeutic exercises, Therapeutic activity, Neuromuscular re-education, Balance training, Gait training, Patient/Family education, Self Care, Joint mobilization, Stair training, Dry Needling, Electrical stimulation, Manual therapy, and Re-evaluation.   PLAN FOR NEXT  SESSION: HEP review and update, LE stretch and strength TPDN, manual as needed, flexibility   Lanice Shirts, PT 12/16/2022, 3:41 PM

## 2022-12-16 ENCOUNTER — Ambulatory Visit: Payer: 59

## 2022-12-16 DIAGNOSIS — G5702 Lesion of sciatic nerve, left lower limb: Secondary | ICD-10-CM

## 2022-12-16 DIAGNOSIS — M5459 Other low back pain: Secondary | ICD-10-CM

## 2022-12-16 DIAGNOSIS — M5416 Radiculopathy, lumbar region: Secondary | ICD-10-CM

## 2022-12-21 ENCOUNTER — Ambulatory Visit: Payer: 59

## 2022-12-21 DIAGNOSIS — G5702 Lesion of sciatic nerve, left lower limb: Secondary | ICD-10-CM | POA: Diagnosis not present

## 2022-12-21 DIAGNOSIS — M5416 Radiculopathy, lumbar region: Secondary | ICD-10-CM

## 2022-12-21 DIAGNOSIS — M5459 Other low back pain: Secondary | ICD-10-CM | POA: Diagnosis not present

## 2022-12-21 NOTE — Therapy (Signed)
OUTPATIENT PHYSICAL THERAPY TREATMENT NOTE   Patient Name: Steven [name] MRN: [id number] DOB:[birth year], 67 y.o., male Today's Date: 12/21/2022  PCP: Glendale Chard, MD  REFERRING PROVIDER: Aundra Dubin, PA-C   END OF SESSION:   PT End of Session - 12/21/22 1342     Visit Number 4    Number of Visits 12    Date for PT Re-Evaluation 01/26/23    Authorization Type UHC MCR    PT Start Time [id number]    PT Stop Time [id number]    PT Time Calculation (min) 40 min    Activity Tolerance Patient tolerated treatment well    Behavior During Therapy WFL for tasks assessed/performed              Past Medical History:  Diagnosis Date   Blindness, legal 12/18/2011   CAD (coronary artery disease), known LAD stent placed in 2007 in [place]. 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 eye with blurred vision 12/18/2011   HTN (hypertension) 12/18/2011   Hypertension    Mixed hyperlipidemia 09/14/2022   Rectal bleeding 03/31/2021   Past Surgical History:  Procedure Laterality Date   CARDIAC SURGERY     stent placed in 2007   [place]  2008   STENT PLACED IN LAD.MILD DIFFUSE IN-STENT RESTENOSIS IN 2010.   CORONARY STENT PLACEMENT     MYOCARDIAL PERFUSION STUDY  01/19/13   NORMAL STRESS NUCLEAR STUDY.   TRANSTHORACIC ECHOCARDIOGRAM  07/28/2009   LV= MILD LVH. EF 60% TO 65%.   VASCULAR US  01/15/09   NORMAL LOWER ARTERIAL DOPPLER   Patient Active Problem List   Diagnosis Date Noted   Mixed hyperlipidemia 09/14/2022   BMI 25.0-25.9,adult 05/07/2022   Abnormal weight loss 03/31/2021   Hemorrhoids 03/31/2021   [place] 03/31/2021   Encounter for general adult medical examination without abnormal findings 03/31/2021   Impaired functional mobility,  balance, gait, and endurance 03/31/2021   Other long term (current) drug therapy 03/31/2021   Second degree hemorrhoids 03/31/2021   Type 2 diabetes mellitus with diabetic neuropathy ([place]) 03/31/2021   Diabetic mononeuropathy associated with type 2 diabetes mellitus ([place]) 08/21/2019   Benign hypertensive heart disease without heart failure 08/21/2019   Coronary atherosclerosis due to calcified coronary lesion (CODE) 08/21/2019   Cervical radiculopathy at C6 11/17/2016   Unexplained night sweats 09/02/2015   Focal choroiditis and chorioretinitis, peripheral, bilateral 08/05/2015   Phthisis bulbi of right eye 06/17/2015   Hypertensive heart disease without heart failure 06/17/2015   Absolute glaucoma of left eye 06/17/2015   High risk medication use 04/27/2013   Aftercare following surgery of the musculoskeletal system, NEC 01/10/2013   Status post cervical arthrodesis 12/13/2012   Diabetes mellitus type 2 in nonobese ([place]) 07/06/2012   Immunosuppression ([place]) 01/11/2012   Chronic idiopathic constipation 12/21/2011   Chest pain at rest 12/18/2011   CAD s/p LAD stent 2007 12/18/2011   Glaucoma, 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 eye with blurred vision 12/18/2011   Cystoid macular edema 09/14/2011  Panuveitis of both eyes 09/14/2011   Sarcoidosis of other sites 09/14/2011   Other mechanical complication of other ocular prosthetic devices, implants and grafts, initial encounter 07/28/2011    REFERRING DIAG: M25.552 (ICD-10-CM) - Pain in left hip   THERAPY DIAG:  Other low back pain  Radiculopathy, lumbar region  Piriformis syndrome of left side  Rationale for Evaluation and Treatment Rehabilitation  PERTINENT HISTORY: Visual impairments  PRECAUTIONS: None   SUBJECTIVE:                                                                                                                                                                                       SUBJECTIVE STATEMENT:  Pt presents to PT with reports of continued severe L hip and LE pain. Has been compliant with HEP.    PAIN:  Are you having pain?  Yes: NPRS scale: 10/10 Pain location: L hip LE Pain description: ache Aggravating factors: undetermined Relieving factors: lying down   OBJECTIVE: (objective measures completed at initial evaluation unless otherwise dated)  DIAGNOSTIC FINDINGS:  Degenerative changes L5-S1 X-rays demonstrate significant osteophyte formation to the acetabulum.   Mild joint space narrowing otherwise.   PATIENT SURVEYS:  FOTO UTA due to poor vision   SCREENING FOR RED FLAGS: neg       MUSCLE LENGTH: Hamstrings: Right 45 deg; Left 45 deg     POSTURE: No Significant postural limitations   PALPATION: Marked TTP L piriformis muscle   LUMBAR ROM:    AROM eval  Flexion    Extension    Right lateral flexion    Left lateral flexion    Right rotation    Left rotation     (Blank rows = not tested)   LOWER EXTREMITY ROM:      Passive  Right eval Left eval  Hip flexion      Hip extension      Hip abduction      Hip adduction      Hip internal rotation   0d  Hip external rotation      Knee flexion      Knee extension      Ankle dorsiflexion      Ankle plantarflexion      Ankle inversion      Ankle eversion       (Blank rows = not tested)   LOWER EXTREMITY MMT:     MMT Right eval Left eval  Hip flexion 4 4  Hip extension 4 4  Hip abduction 4 4  Hip adduction      Hip internal rotation      Hip external rotation  Knee flexion 4 4  Knee extension 4 4  Ankle dorsiflexion      Ankle plantarflexion 4 4  Ankle inversion      Ankle eversion       (Blank rows = not tested)   LUMBAR SPECIAL TESTS:  Straight leg raise test: Negative, Slump test:  Negative, and Single leg stance test: NT   FUNCTIONAL TESTS:  5 times sit to stand: 13s arms crossed   GAIT: Distance walked: 90fx2 Assistive device utilized: None Level of assistance: Complete Independence Comments: antalgic pattern   TREATMENT: [facility] Adult PT Treatment:                                                DATE: 12/21/22 Therapeutic Exercise: NuStep lvl 5 UE/LE x 5 min while taking subjective LTR x 10  Bridge with ball 2x10 Supine clamshell 3x10 GTB Supine march 2x20 GTB Supine figure 4 stretch 2x30" L Supine SLR 2x10 2# Seated hamstring stretch 30s x2 Seated figure 4 2x30" L Manual Therapy: L piriformis release R S/L 8 min  [facility] Adult PT Treatment:                                                DATE: 12/16/22 Therapeutic Exercise: Seated hamstring stretch 30s x2 Supine QL stretch 30s x2 Bridge with ball 15x Supine march 2# 15/15 L hip flexor stretch SLR 2# 15x Bil Nustep L4 8 min Manual Therapy: L piriformis release R S/L 8 min  [facility] Adult PT Treatment:                                                DATE: 12/14/2022 Therapeutic Exercise: NuStep lvl 5 UE/LE x 5 min while taking subjective LTR x 10 each Supine piriformis 2x30" L Bridge 2x10  S/L clamshell 2x10 RTB Supine SLR 2x10 each Seated fig 4 stretch 2x30" L STS 2x10 - no UE support Standing hip abd 2x10 each  PATIENT EDUCATION:  Education details: Discussed eval findings, rehab rationale and POC and patient is in agreement  Person educated: Patient Education method: Explanation and Handouts Education comprehension: verbalized understanding and needs further education   HOME EXERCISE PROGRAM: Access Code: [email or web address]: [email or web address] Date: 12/14/2022 Prepared by: DOctavio Manns Exercises - Clamshell  - 2 x daily - 5 x weekly - 1 sets - 15 reps - Seated Table Hamstring Stretch  - 2 x daily - 5 x weekly - 1 sets - 2 reps - 30s hold - Hooklying Single Knee to Chest Stretch   - 2 x daily - 5 x weekly - 1 sets - 2 reps - 30s hold - Supine Bridge  - 1 x daily - 5 x weekly - 2 sets - 10 reps - Supine Piriformis Stretch with Leg Straight  - 1 x daily - 5 x weekly - 2 reps - 30 sec hold - Seated Piriformis Stretch with Trunk Bend  - 1 x daily - 5 x weekly - 2 reps - 30 sec hold   ASSESSMENT:   CLINICAL IMPRESSION: Pt was able to complete all  prescribed exercises with no adverse effect or increase in pain. Therapy focused on improving proximal hip strength and muscle length in order to decrease pain and improve comfort. HEP update, pt is progressing as expected. Will continue per POC.     OBJECTIVE IMPAIRMENTS: Abnormal gait, decreased endurance, decreased knowledge of condition, decreased mobility, difficulty walking, decreased ROM, decreased strength, hypomobility, increased fascial restrictions, increased muscle spasms, impaired flexibility, improper body mechanics, and pain.    ACTIVITY LIMITATIONS: carrying, lifting, bending, sitting, squatting, and stairs   PERSONAL FACTORS: Age and Time since onset of injury/illness/exacerbation are also affecting patient's functional outcome.      GOALS: Goals reviewed with patient? No   SHORT TERM GOALS: Target date: 12/22/2022   Patient to demonstrate independence in HEP  Baseline: [id number] Goal status: INITIAL   2.  Decrease worst pain to 8/10 Baseline: 10/10 Goal status: INITIAL   3. Assess FOTO            Baseline: TBD due to poor vision            Goal status: INITIAL     LONG TERM GOALS: Target date: 01/12/2023   6/10 worst pain Baseline: 10/10 Goal status: INITIAL   2.  60d SLR B Baseline: 45d SLR B Goal status: INITIAL   3.  Decrease L piriformis tenderness to mild Baseline: severe Goal status: INITIAL   4.  Increase L hip IR to 10d  Baseline: 0d Goal status: INITIAL   5.  Assess progress to FOTO goal Baseline: TBD Goal status: INITIAL       PLAN:   PT FREQUENCY: 2x/week   PT DURATION:  6 weeks   PLANNED INTERVENTIONS: Therapeutic exercises, Therapeutic activity, Neuromuscular re-education, Balance training, Gait training, Patient/Family education, Self Care, Joint mobilization, Stair training, Dry Needling, Electrical stimulation, Manual therapy, and Re-evaluation.   PLAN FOR NEXT SESSION: HEP review and update, LE stretch and strength TPDN, manual as needed, flexibility   Ward Chatters, PT 12/21/2022, 2:40 PM

## 2022-12-23 ENCOUNTER — Ambulatory Visit: Payer: 59

## 2022-12-23 DIAGNOSIS — M5459 Other low back pain: Secondary | ICD-10-CM | POA: Diagnosis not present

## 2022-12-23 DIAGNOSIS — M5416 Radiculopathy, lumbar region: Secondary | ICD-10-CM

## 2022-12-23 DIAGNOSIS — G5702 Lesion of sciatic nerve, left lower limb: Secondary | ICD-10-CM

## 2022-12-23 NOTE — Therapy (Signed)
OUTPATIENT PHYSICAL THERAPY TREATMENT NOTE   Patient Name: Steven [name] MRN: [id number] DOB:01-May-1956, 67 y.o., male Today's Date: 12/23/2022  PCP: Glendale Chard, MD  REFERRING PROVIDER: Aundra Dubin, PA-C   END OF SESSION:   PT End of Session - 12/23/22 1354     Visit Number 5    Number of Visits 12    Date for PT Re-Evaluation 01/26/23    Authorization Type UHC MCR    PT Start Time [id number]    PT Stop Time 1435    PT Time Calculation (min) 40 min    Activity Tolerance Patient tolerated treatment well    Behavior During Therapy WFL for tasks assessed/performed               Past Medical History:  Diagnosis Date   Blindness, legal 12/18/2011   CAD (coronary artery disease), known LAD stent placed in 2007 in [place]. 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 eye with blurred vision 12/18/2011   HTN (hypertension) 12/18/2011   Hypertension    Mixed hyperlipidemia 09/14/2022   Rectal bleeding 03/31/2021   Past Surgical History:  Procedure Laterality Date   CARDIAC SURGERY     stent placed in 2007   [place]  2008   STENT PLACED IN LAD.MILD DIFFUSE IN-STENT RESTENOSIS IN 2010.   CORONARY STENT PLACEMENT     MYOCARDIAL PERFUSION STUDY  01/19/13   NORMAL STRESS NUCLEAR STUDY.   TRANSTHORACIC ECHOCARDIOGRAM  07/28/2009   LV= MILD LVH. EF 60% TO 65%.   VASCULAR US  01/15/09   NORMAL LOWER ARTERIAL DOPPLER   Patient Active Problem List   Diagnosis Date Noted   Mixed hyperlipidemia 09/14/2022   BMI 25.0-25.9,adult 05/07/2022   Abnormal weight loss 03/31/2021   Hemorrhoids 03/31/2021   [place] 03/31/2021   Encounter for general adult medical examination without abnormal findings 03/31/2021   Impaired functional mobility,  balance, gait, and endurance 03/31/2021   Other long term (current) drug therapy 03/31/2021   Second degree hemorrhoids 03/31/2021   Type 2 diabetes mellitus with diabetic neuropathy ([place]) 03/31/2021   Diabetic mononeuropathy associated with type 2 diabetes mellitus ([place]) 08/21/2019   Benign hypertensive heart disease without heart failure 08/21/2019   Coronary atherosclerosis due to calcified coronary lesion ([facility]) 08/21/2019   Cervical radiculopathy at C6 11/17/2016   Unexplained night sweats 09/02/2015   Focal choroiditis and chorioretinitis, peripheral, bilateral 08/05/2015   Phthisis bulbi of right eye 06/17/2015   Hypertensive heart disease without heart failure 06/17/2015   Absolute glaucoma of left eye 06/17/2015   High risk medication use 04/27/2013   Aftercare following surgery of the musculoskeletal system, NEC 01/10/2013   Status post cervical arthrodesis 12/13/2012   Diabetes mellitus type 2 in nonobese ([place]) 07/06/2012   Immunosuppression ([place]) 01/11/2012   Chronic idiopathic constipation 12/21/2011   Chest pain at rest 12/18/2011   CAD s/p LAD stent 2007 12/18/2011   Glaucoma, 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 eye with blurred vision 12/18/2011   Cystoid macular edema 09/14/2011  Panuveitis of both eyes 09/14/2011   Sarcoidosis of other sites 09/14/2011   Other mechanical complication of other ocular prosthetic devices, implants and grafts, initial encounter 07/28/2011    REFERRING DIAG: M25.552 (ICD-10-CM) - Pain in left hip   THERAPY DIAG:  Other low back pain  Radiculopathy, lumbar region  Piriformis syndrome of left side  Rationale for Evaluation and Treatment Rehabilitation  PERTINENT HISTORY: Visual impairments  PRECAUTIONS: None   SUBJECTIVE:                                                                                                                                                                                       SUBJECTIVE STATEMENT:  Pt presents to PT with no current L LE pain. Has been compliant with HEP.    PAIN:  Are you having pain?  No: NPRS scale: 0/10 Pain location: L hip LE Pain description: ache Aggravating factors: undetermined Relieving factors: lying down   OBJECTIVE: (objective measures completed at initial evaluation unless otherwise dated)  DIAGNOSTIC FINDINGS:  Degenerative changes L5-S1 X-rays demonstrate significant osteophyte formation to the acetabulum.   Mild joint space narrowing otherwise.   PATIENT SURVEYS:  FOTO UTA due to poor vision   SCREENING FOR RED FLAGS: neg       MUSCLE LENGTH: Hamstrings: Right 45 deg; Left 45 deg     POSTURE: No Significant postural limitations   PALPATION: Marked TTP L piriformis muscle   LUMBAR ROM:    AROM eval  Flexion    Extension    Right lateral flexion    Left lateral flexion    Right rotation    Left rotation     (Blank rows = not tested)   LOWER EXTREMITY ROM:      Passive  Right eval Left eval  Hip flexion      Hip extension      Hip abduction      Hip adduction      Hip internal rotation   0d  Hip external rotation      Knee flexion      Knee extension      Ankle dorsiflexion      Ankle plantarflexion      Ankle inversion      Ankle eversion       (Blank rows = not tested)   LOWER EXTREMITY MMT:     MMT Right eval Left eval  Hip flexion 4 4  Hip extension 4 4  Hip abduction 4 4  Hip adduction      Hip internal rotation      Hip external rotation      Knee flexion 4  4  Knee extension 4 4  Ankle dorsiflexion      Ankle plantarflexion 4 4  Ankle inversion      Ankle eversion       (Blank rows = not tested)   LUMBAR SPECIAL TESTS:  Straight leg raise test: Negative, Slump test: Negative, and Single leg stance  test: NT   FUNCTIONAL TESTS:  5 times sit to stand: 13s arms crossed   GAIT: Distance walked: 22fx2 Assistive device utilized: None Level of assistance: Complete Independence Comments: antalgic pattern   TREATMENT: [facility] Adult PT Treatment:                                                DATE: 12/23/22 Therapeutic Exercise: NuStep lvl 5 UE/LE x 5 min while taking subjective LTR x 10  Bridge with ball 3x10 Supine clamshell 3x15 GTB Supine march 2x20 GTB Supine figure 4 stretch 2x30" L Supine SLR 2x10 2# Seated hamstring stretch 2x30" Seated figure 4 2x60" L Standing hip abd 2x10 Standing mini squat 2x10 with UE Manual Therapy: L piriformis release R S/L 8 min  [facility] Adult PT Treatment:                                                DATE: 12/21/22 Therapeutic Exercise: NuStep lvl 5 UE/LE x 5 min while taking subjective LTR x 10  Bridge with ball 2x10 Supine clamshell 3x10 GTB Supine march 2x20 GTB Supine figure 4 stretch 2x30" L Supine SLR 2x10 2# Seated hamstring stretch 30s x2 Seated figure 4 2x30" L Manual Therapy: L piriformis release R S/L 8 min  [facility] Adult PT Treatment:                                                DATE: 12/16/22 Therapeutic Exercise: Seated hamstring stretch 30s x2 Supine QL stretch 30s x2 Bridge with ball 15x Supine march 2# 15/15 L hip flexor stretch SLR 2# 15x Bil Nustep L4 8 min Manual Therapy: L piriformis release R S/L 8 min  [facility] Adult PT Treatment:                                                DATE: 12/14/2022 Therapeutic Exercise: NuStep lvl 5 UE/LE x 5 min while taking subjective LTR x 10 each Supine piriformis 2x30" L Bridge 2x10  S/L clamshell 2x10 RTB Supine SLR 2x10 each Seated fig 4 stretch 2x30" L STS 2x10 - no UE support Standing hip abd 2x10 each  PATIENT EDUCATION:  Education details: Discussed eval findings, rehab rationale and POC and patient is in agreement  Person educated: Patient Education method:  Explanation and Handouts Education comprehension: verbalized understanding and needs further education   HOME EXERCISE PROGRAM: Access Code: [email or web address]: [email or web address] Date: 12/14/2022 Prepared by: DOctavio Manns Exercises - Clamshell  - 2 x daily - 5 x weekly - 1 sets - 15 reps - Seated Table Hamstring Stretch  - 2  x daily - 5 x weekly - 1 sets - 2 reps - 30s hold - Hooklying Single Knee to Chest Stretch  - 2 x daily - 5 x weekly - 1 sets - 2 reps - 30s hold - Supine Bridge  - 1 x daily - 5 x weekly - 2 sets - 10 reps - Supine Piriformis Stretch with Leg Straight  - 1 x daily - 5 x weekly - 2 reps - 30 sec hold - Seated Piriformis Stretch with Trunk Bend  - 1 x daily - 5 x weekly - 2 reps - 30 sec hold   ASSESSMENT:   CLINICAL IMPRESSION: Pt was able to complete all prescribed exercises with no adverse effect or increase in pain. Therapy focused on improving proximal hip strength and muscle length in order to decrease pain and improve comfort. HEP update, pt is progressing as expected. Will continue per POC.      OBJECTIVE IMPAIRMENTS: Abnormal gait, decreased endurance, decreased knowledge of condition, decreased mobility, difficulty walking, decreased ROM, decreased strength, hypomobility, increased fascial restrictions, increased muscle spasms, impaired flexibility, improper body mechanics, and pain.    ACTIVITY LIMITATIONS: carrying, lifting, bending, sitting, squatting, and stairs   PERSONAL FACTORS: Age and Time since onset of injury/illness/exacerbation are also affecting patient's functional outcome.      GOALS: Goals reviewed with patient? No   SHORT TERM GOALS: Target date: 12/22/2022   Patient to demonstrate independence in HEP  Baseline: [id number] Goal status: INITIAL   2.  Decrease worst pain to 8/10 Baseline: 10/10 Goal status: INITIAL   3. Assess FOTO            Baseline: TBD due to poor vision            Goal status: INITIAL     LONG  TERM GOALS: Target date: 01/12/2023   6/10 worst pain Baseline: 10/10 Goal status: INITIAL   2.  60d SLR B Baseline: 45d SLR B Goal status: INITIAL   3.  Decrease L piriformis tenderness to mild Baseline: severe Goal status: INITIAL   4.  Increase L hip IR to 10d  Baseline: 0d Goal status: INITIAL   5.  Assess progress to FOTO goal Baseline: TBD Goal status: INITIAL       PLAN:   PT FREQUENCY: 2x/week   PT DURATION: 6 weeks   PLANNED INTERVENTIONS: Therapeutic exercises, Therapeutic activity, Neuromuscular re-education, Balance training, Gait training, Patient/Family education, Self Care, Joint mobilization, Stair training, Dry Needling, Electrical stimulation, Manual therapy, and Re-evaluation.   PLAN FOR NEXT SESSION: HEP review and update, LE stretch and strength TPDN, manual as needed, flexibility   Ward Chatters, PT 12/23/2022, 2:37 PM

## 2022-12-28 ENCOUNTER — Other Ambulatory Visit: Payer: Self-pay | Admitting: Internal Medicine

## 2022-12-28 ENCOUNTER — Ambulatory Visit: Payer: 59

## 2022-12-28 DIAGNOSIS — G5702 Lesion of sciatic nerve, left lower limb: Secondary | ICD-10-CM

## 2022-12-28 DIAGNOSIS — M5416 Radiculopathy, lumbar region: Secondary | ICD-10-CM | POA: Diagnosis not present

## 2022-12-28 DIAGNOSIS — N5201 Erectile dysfunction due to arterial insufficiency: Secondary | ICD-10-CM

## 2022-12-28 DIAGNOSIS — M5459 Other low back pain: Secondary | ICD-10-CM | POA: Diagnosis not present

## 2022-12-28 NOTE — Therapy (Signed)
OUTPATIENT PHYSICAL THERAPY TREATMENT NOTE   Patient Name: Steven [name] MRN: [id number] DOB:16-Jun-1956, 67 y.o., male Today's Date: 12/28/2022  PCP: Glendale Chard, MD  REFERRING PROVIDER: Aundra Dubin, PA-C   END OF SESSION:   PT End of Session - 12/28/22 1441     Visit Number 6    Number of Visits 12    Date for PT Re-Evaluation 01/26/23    Authorization Type U[employer] MCR    PT Start Time 1400    PT Stop Time [id number]    PT Time Calculation (min) 45 min    Activity Tolerance Patient tolerated treatment well    Behavior During Therapy WFL for tasks assessed/performed                Past Medical History:  Diagnosis Date   Blindness, legal 12/18/2011   CAD (coronary artery disease), known LAD stent placed in 2007 in [place]. 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 eye with blurred vision 12/18/2011   HTN (hypertension) 12/18/2011   Hypertension    Mixed hyperlipidemia 09/14/2022   Rectal bleeding 03/31/2021   Past Surgical History:  Procedure Laterality Date   CARDIAC SURGERY     stent placed in 2007   [place]  2008   STENT PLACED IN LAD.MILD DIFFUSE IN-STENT RESTENOSIS IN 2010.   CORONARY STENT PLACEMENT     MYOCARDIAL PERFUSION STUDY  01/19/13   NORMAL STRESS NUCLEAR STUDY.   TRANSTHORACIC ECHOCARDIOGRAM  07/28/2009   LV= MILD LVH. EF 60% TO 65%.   VASCULAR US  01/15/09   NORMAL LOWER ARTERIAL DOPPLER   Patient Active Problem List   Diagnosis Date Noted   Mixed hyperlipidemia 09/14/2022   BMI 25.0-25.9,adult 05/07/2022   Abnormal weight loss 03/31/2021   Hemorrhoids 03/31/2021   [place] 03/31/2021   Encounter for general adult medical examination without abnormal findings 03/31/2021   Impaired functional mobility,  balance, gait, and endurance 03/31/2021   Other long term (current) drug therapy 03/31/2021   Second degree hemorrhoids 03/31/2021   Type 2 diabetes mellitus with diabetic neuropathy ([place]) 03/31/2021   Diabetic mononeuropathy associated with type 2 diabetes mellitus ([place]) 08/21/2019   Benign hypertensive heart disease without heart failure 08/21/2019   Coronary atherosclerosis due to calcified coronary lesion (CODE) 08/21/2019   Cervical radiculopathy at C6 11/17/2016   Unexplained night sweats 09/02/2015   Focal choroiditis and chorioretinitis, peripheral, bilateral 08/05/2015   Phthisis bulbi of right eye 06/17/2015   Hypertensive heart disease without heart failure 06/17/2015   Absolute glaucoma of left eye 06/17/2015   High risk medication use 04/27/2013   Aftercare following surgery of the musculoskeletal system, NEC 01/10/2013   Status post cervical arthrodesis 12/13/2012   Diabetes mellitus type 2 in nonobese ([place]) 07/06/2012   Immunosuppression ([place]) 01/11/2012   Chronic idiopathic constipation 12/21/2011   Chest pain at rest 12/18/2011   CAD s/p LAD stent 2007 12/18/2011   Glaucoma, 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 eye with blurred vision 12/18/2011   Cystoid macular edema 09/14/2011  Panuveitis of both eyes 09/14/2011   Sarcoidosis of other sites 09/14/2011   Other mechanical complication of other ocular prosthetic devices, implants and grafts, initial encounter 07/28/2011    REFERRING DIAG: M25.552 (ICD-10-CM) - Pain in left hip   THERAPY DIAG:  Other low back pain  Radiculopathy, lumbar region  Piriformis syndrome of left side  Rationale for Evaluation and Treatment Rehabilitation  PERTINENT HISTORY: Visual impairments  PRECAUTIONS: None   SUBJECTIVE:                                                                                                                                                                                       SUBJECTIVE STATEMENT:  Minimal L hip pain at start of session.  Reported L hip pain earlier this AM 6/10 intensity.  Reluctant to pursue TPDN.   PAIN:  Are you having pain?  No: NPRS scale: 0/10 Pain location: L hip LE Pain description: ache Aggravating factors: undetermined Relieving factors: lying down   OBJECTIVE: (objective measures completed at initial evaluation unless otherwise dated)  DIAGNOSTIC FINDINGS:  Degenerative changes L5-S1 X-rays demonstrate significant osteophyte formation to the acetabulum.   Mild joint space narrowing otherwise.   PATIENT SURVEYS:  FOTO UTA due to poor vision   SCREENING FOR RED FLAGS: neg       MUSCLE LENGTH: Hamstrings: Right 45 deg; Left 45 deg     POSTURE: No Significant postural limitations   PALPATION: Marked TTP L piriformis muscle   LUMBAR ROM:    AROM eval  Flexion    Extension    Right lateral flexion    Left lateral flexion    Right rotation    Left rotation     (Blank rows = not tested)   LOWER EXTREMITY ROM:      Passive  Right eval Left eval  Hip flexion      Hip extension      Hip abduction      Hip adduction      Hip internal rotation   0d  Hip external rotation      Knee flexion      Knee extension      Ankle dorsiflexion      Ankle plantarflexion      Ankle inversion      Ankle eversion       (Blank rows = not tested)   LOWER EXTREMITY MMT:     MMT Right eval Left eval  Hip flexion 4 4  Hip extension 4 4  Hip abduction 4 4  Hip adduction      Hip internal rotation      Hip external rotation  Knee flexion 4 4  Knee extension 4 4  Ankle dorsiflexion      Ankle plantarflexion 4 4  Ankle inversion      Ankle eversion       (Blank rows = not tested)   LUMBAR SPECIAL TESTS:  Straight leg raise test:  Negative, Slump test: Negative, and Single leg stance test: NT   FUNCTIONAL TESTS:  5 times sit to stand: 13s arms crossed   GAIT: Distance walked: 33ftx2 Assistive device utilized: None Level of assistance: Complete Independence Comments: antalgic pattern   TREATMENT: [facility] Adult PT Treatment:                                                DATE: 12/28/22 Therapeutic Exercise: NuStep lvl  QL stretch 30s x2 Bil Supine L hip flexor stretch 30s x2 Bridge with 5# ball 15x Single leg bridge 15/15  Supine clamshell x15 Bil, 15/15 unilateral  GTB Seated hamstring stretch 2x30"  Manual Therapy: L piriformis release 8 min R S/L  [facility] Adult PT Treatment:                                                DATE: 12/23/22 Therapeutic Exercise: NuStep lvl 5 UE/LE x 5 min while taking subjective LTR x 10  Bridge with ball 3x10 Supine clamshell 3x15 GTB Supine march 2x20 GTB Supine figure 4 stretch 2x30" L Supine SLR 2x10 2# Seated hamstring stretch 2x30" Seated figure 4 2x60" L Standing hip abd 2x10 Standing mini squat 2x10 with UE Manual Therapy: L piriformis release R S/L 8 min  [facility] Adult PT Treatment:                                                DATE: 12/21/22 Therapeutic Exercise: NuStep lvl 5 UE/LE x 5 min while taking subjective LTR x 10  Bridge with ball 2x10 Supine clamshell 3x10 GTB Supine march 2x20 GTB Supine figure 4 stretch 2x30" L Supine SLR 2x10 2# Seated hamstring stretch 30s x2 Seated figure 4 2x30" L Manual Therapy: L piriformis release R S/L 8 min  [facility] Adult PT Treatment:                                                DATE: 12/16/22 Therapeutic Exercise: Seated hamstring stretch 30s x2 Supine QL stretch 30s x2 Bridge with ball 15x Supine march 2# 15/15 L hip flexor stretch SLR 2# 15x Bil Nustep L4 8 min Manual Therapy: L piriformis release R S/L 8 min  [facility] Adult PT Treatment:                                                DATE: 12/14/2022 Therapeutic  Exercise: NuStep lvl 5 UE/LE x 5 min while taking subjective LTR x 10 each Supine piriformis 2x30" L  Bridge 2x10  S/L clamshell 2x10 RTB Supine SLR 2x10 each Seated fig 4 stretch 2x30" L STS 2x10 - no UE support Standing hip abd 2x10 each  PATIENT EDUCATION:  Education details: Discussed eval findings, rehab rationale and POC and patient is in agreement  Person educated: Patient Education method: Explanation and Handouts Education comprehension: verbalized understanding and needs further education   HOME EXERCISE PROGRAM: Access Code: [email or web address] URL: [email or web address] Date: 12/14/2022 Prepared by: Octavio Manns  Exercises - Clamshell  - 2 x daily - 5 x weekly - 1 sets - 15 reps - Seated Table Hamstring Stretch  - 2 x daily - 5 x weekly - 1 sets - 2 reps - 30s hold - Hooklying Single Knee to Chest Stretch  - 2 x daily - 5 x weekly - 1 sets - 2 reps - 30s hold - Supine Bridge  - 1 x daily - 5 x weekly - 2 sets - 10 reps - Supine Piriformis Stretch with Leg Straight  - 1 x daily - 5 x weekly - 2 reps - 30 sec hold - Seated Piriformis Stretch with Trunk Bend  - 1 x daily - 5 x weekly - 2 reps - 30 sec hold   ASSESSMENT:   CLINICAL IMPRESSION: Continues to remain reluctant to dry needle.  Focused on hip strength and stability tasks as well as stretching to L/S region.  Added hip flexor stretch L, continued manual release of L piriformis. Nustep at end of session to address any potential soreness.   OBJECTIVE IMPAIRMENTS: Abnormal gait, decreased endurance, decreased knowledge of condition, decreased mobility, difficulty walking, decreased ROM, decreased strength, hypomobility, increased fascial restrictions, increased muscle spasms, impaired flexibility, improper body mechanics, and pain.    ACTIVITY LIMITATIONS: carrying, lifting, bending, sitting, squatting, and stairs   PERSONAL FACTORS: Age and Time since onset of injury/illness/exacerbation are also affecting  patient's functional outcome.      GOALS: Goals reviewed with patient? No   SHORT TERM GOALS: Target date: 12/22/2022   Patient to demonstrate independence in HEP  Baseline: [id number] Goal status: INITIAL   2.  Decrease worst pain to 8/10 Baseline: 10/10 Goal status: INITIAL   3. Assess FOTO            Baseline: TBD due to poor vision            Goal status: INITIAL     LONG TERM GOALS: Target date: 01/12/2023   6/10 worst pain Baseline: 10/10 Goal status: INITIAL   2.  60d SLR B Baseline: 45d SLR B Goal status: INITIAL   3.  Decrease L piriformis tenderness to mild Baseline: severe Goal status: INITIAL   4.  Increase L hip IR to 10d  Baseline: 0d Goal status: INITIAL   5.  Assess progress to FOTO goal Baseline: TBD Goal status: INITIAL       PLAN:   PT FREQUENCY: 2x/week   PT DURATION: 6 weeks   PLANNED INTERVENTIONS: Therapeutic exercises, Therapeutic activity, Neuromuscular re-education, Balance training, Gait training, Patient/Family education, Self Care, Joint mobilization, Stair training, Dry Needling, Electrical stimulation, Manual therapy, and Re-evaluation.   PLAN FOR NEXT SESSION: HEP review and update, LE stretch and strength TPDN, manual as needed, flexibility   Lanice Shirts, PT 12/28/2022, 2:43 PM

## 2022-12-30 ENCOUNTER — Ambulatory Visit: Payer: 59

## 2023-01-03 NOTE — Therapy (Unsigned)
OUTPATIENT PHYSICAL THERAPY TREATMENT NOTE   Patient Name: Steven [name] MRN: [id number] DOB:10/16/1955, 67 y.o., male Today's Date: 01/04/2023  PCP: Dorothyann Peng, MD  REFERRING PROVIDER: Cristie Hem, PA-C   END OF SESSION:   PT End of Session - 01/04/23 1350     Visit Number 7    Number of Visits 12    Date for PT Re-Evaluation 01/26/23    Authorization Type UHC MCR    PT Start Time 1400    PT Stop Time 1440    PT Time Calculation (min) 40 min    Activity Tolerance Patient tolerated treatment well    Behavior During Therapy WFL for tasks assessed/performed                Past Medical History:  Diagnosis Date   Blindness, legal 12/18/2011   CAD (coronary artery disease), known LAD stent placed in 2007 in [place] 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 eye with blurred vision 12/18/2011   HTN (hypertension) 12/18/2011   Hypertension    Mixed hyperlipidemia 09/14/2022   Rectal bleeding 03/31/2021   Past Surgical History:  Procedure Laterality Date   CARDIAC SURGERY     stent placed in 2007   CORNEAL TRANSPLANT     CORONARY ANGIOPLASTY  2008   STENT PLACED IN LAD.MILD DIFFUSE IN-STENT RESTENOSIS IN 2010.   CORONARY STENT PLACEMENT     MYOCARDIAL PERFUSION STUDY  01/19/13   NORMAL STRESS NUCLEAR STUDY.   TRANSTHORACIC ECHOCARDIOGRAM  07/28/2009   LV= MILD LVH. EF 60% TO 65%.   VASCULAR US  01/15/09   NORMAL LOWER ARTERIAL DOPPLER   Patient Active Problem List   Diagnosis Date Noted   Mixed hyperlipidemia 09/14/2022   BMI 25.0-25.9,adult 05/07/2022   Abnormal weight loss 03/31/2021   Hemorrhoids 03/31/2021   [place] 03/31/2021   Encounter for general adult medical examination without abnormal findings 03/31/2021   Impaired functional mobility,  balance, gait, and endurance 03/31/2021   Other long term (current) drug therapy 03/31/2021   Second degree hemorrhoids 03/31/2021   Type 2 diabetes mellitus with diabetic neuropathy ([facility]) 03/31/2021   Diabetic mononeuropathy associated with type 2 diabetes mellitus ([facility]) 08/21/2019   Benign hypertensive heart disease without heart failure 08/21/2019   Coronary atherosclerosis due to calcified coronary lesion (CODE) 08/21/2019   Cervical radiculopathy at C6 11/17/2016   Unexplained night sweats 09/02/2015   Focal choroiditis and chorioretinitis, peripheral, bilateral 08/05/2015   Phthisis bulbi of right eye 06/17/2015   Hypertensive heart disease without heart failure 06/17/2015   Absolute glaucoma of left eye 06/17/2015   High risk medication use 04/27/2013   Aftercare following surgery of the musculoskeletal system, NEC 01/10/2013   Status post cervical arthrodesis 12/13/2012   Diabetes mellitus type 2 in nonobese ([facility]) 07/06/2012   Immunosuppression ([facility]) 01/11/2012   Chronic idiopathic constipation 12/21/2011   Chest pain at rest 12/18/2011   CAD s/p LAD stent 2007 12/18/2011   Glaucoma, 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 eye with blurred vision 12/18/2011   Cystoid macular edema 09/14/2011  Panuveitis of both eyes 09/14/2011   Sarcoidosis of other sites 09/14/2011   Other mechanical complication of other ocular prosthetic devices, implants and grafts, initial encounter 07/28/2011    REFERRING DIAG: M25.552 (ICD-10-CM) - Pain in left hip   THERAPY DIAG:  Other low back pain  Radiculopathy, lumbar region  Piriformis syndrome of left side  Rationale for Evaluation and Treatment Rehabilitation  PERTINENT HISTORY: Visual impairments  PRECAUTIONS: None   SUBJECTIVE:                                                                                                                                                                                       SUBJECTIVE STATEMENT: Reports fluctuating L hip pain, cannot identify any aggravating factors but must lie down when symptoms onset.  Remains reluctant to dry needle.  Will return to MD at end of PT treatment for possible injection or MRI.  Hip pain spikes to 8/10 when present   PAIN:  Are you having pain?  No: NPRS scale: 0/10 Pain location: L hip LE Pain description: ache Aggravating factors: undetermined Relieving factors: lying down   OBJECTIVE: (objective measures completed at initial evaluation unless otherwise dated)  DIAGNOSTIC FINDINGS:  Degenerative changes L5-S1 X-rays demonstrate significant osteophyte formation to the acetabulum.   Mild joint space narrowing otherwise.   PATIENT SURVEYS:  FOTO UTA due to poor vision   SCREENING FOR RED FLAGS: neg       MUSCLE LENGTH: Hamstrings: Right 45 deg; Left 45 deg     POSTURE: No Significant postural limitations   PALPATION: Marked TTP L piriformis muscle   LUMBAR ROM:    AROM eval  Flexion    Extension    Right lateral flexion    Left lateral flexion    Right rotation    Left rotation     (Blank rows = not tested)   LOWER EXTREMITY ROM:      Passive  Right eval Left eval  Hip flexion      Hip extension      Hip abduction      Hip adduction      Hip internal rotation   0d  Hip external rotation      Knee flexion      Knee extension      Ankle dorsiflexion      Ankle plantarflexion      Ankle inversion      Ankle eversion       (Blank rows = not tested)   LOWER EXTREMITY MMT:     MMT Right eval Left eval  Hip flexion 4 4  Hip extension 4 4  Hip abduction  4 4  Hip adduction      Hip internal rotation      Hip external rotation      Knee flexion 4 4  Knee extension 4 4  Ankle dorsiflexion      Ankle plantarflexion 4  4  Ankle inversion      Ankle eversion       (Blank rows = not tested)   LUMBAR SPECIAL TESTS:  Straight leg raise test: Negative, Slump test: Negative, and Single leg stance test: NT   FUNCTIONAL TESTS:  5 times sit to stand: 13s arms crossed   GAIT: Distance walked: 32ftx2 Assistive device utilized: None Level of assistance: Complete Independence Comments: antalgic pattern   TREATMENT: [facility] Adult PT Treatment:                                                DATE: 01/04/23 Therapeutic Exercise: NuStep lvl 4 8 min QL stretch 30s x2 Bil Supine L hip flexor stretch 30s x2 Bridge with 5# ball 15x2 Single leg bridge 15/15  Supine clamshell x15 Bil, 15/15 unilateral  GTB Seated hamstring stretch 2x30" Supine L piriformis stretch fig 4 direction 30s x2 L PKB with strap 30s x2 90/90 30s x2  [facility] Adult PT Treatment:                                                DATE: 12/28/22 Therapeutic Exercise: NuStep lvl 4 8 min QL stretch 30s x2 Bil Supine L hip flexor stretch 30s x2 Bridge with 5# ball 15x Single leg bridge 15/15  Supine clamshell x15 Bil, 15/15 unilateral  GTB Seated hamstring stretch 2x30"  Manual Therapy: L piriformis release 8 min R S/L  [facility] Adult PT Treatment:                                                DATE: 12/23/22 Therapeutic Exercise: NuStep lvl 5 UE/LE x 5 min while taking subjective LTR x 10  Bridge with ball 3x10 Supine clamshell 3x15 GTB Supine march 2x20 GTB Supine figure 4 stretch 2x30" L Supine SLR 2x10 2# Seated hamstring stretch 2x30" Seated figure 4 2x60" L Standing hip abd 2x10 Standing mini squat 2x10 with UE Manual Therapy: L piriformis release R S/L 8 min  [facility] Adult PT Treatment:                                                DATE: 12/21/22 Therapeutic Exercise: NuStep lvl 5 UE/LE x 5 min while taking subjective LTR x 10  Bridge with ball 2x10 Supine clamshell 3x10 GTB Supine march 2x20 GTB Supine figure 4 stretch 2x30"  L Supine SLR 2x10 2# Seated hamstring stretch 30s x2 Seated figure 4 2x30" L Manual Therapy: L piriformis release R S/L 8 min  [facility] Adult PT Treatment:  DATE: 12/16/22 Therapeutic Exercise: Seated hamstring stretch 30s x2 Supine QL stretch 30s x2 Bridge with ball 15x Supine march 2# 15/15 L hip flexor stretch SLR 2# 15x Bil Nustep L4 8 min Manual Therapy: L piriformis release R S/L 8 min  [facility] Adult PT Treatment:                                                DATE: 12/14/2022 Therapeutic Exercise: NuStep lvl 5 UE/LE x 5 min while taking subjective LTR x 10 each Supine piriformis 2x30" L Bridge 2x10  S/L clamshell 2x10 RTB Supine SLR 2x10 each Seated fig 4 stretch 2x30" L STS 2x10 - no UE support Standing hip abd 2x10 each  PATIENT EDUCATION:  Education details: Discussed eval findings, rehab rationale and POC and patient is in agreement  Person educated: Patient Education method: Explanation and Handouts Education comprehension: verbalized understanding and needs further education   HOME EXERCISE PROGRAM: Access Code: [email or web address] URL: [email or web address] Date: 12/14/2022 Prepared by: Edwinna Areola  Exercises - Clamshell  - 2 x daily - 5 x weekly - 1 sets - 15 reps - Seated Table Hamstring Stretch  - 2 x daily - 5 x weekly - 1 sets - 2 reps - 30s hold - Hooklying Single Knee to Chest Stretch  - 2 x daily - 5 x weekly - 1 sets - 2 reps - 30s hold - Supine Bridge  - 1 x daily - 5 x weekly - 2 sets - 10 reps - Supine Piriformis Stretch with Leg Straight  - 1 x daily - 5 x weekly - 2 reps - 30 sec hold - Seated Piriformis Stretch with Trunk Bend  - 1 x daily - 5 x weekly - 2 reps - 30 sec hold   ASSESSMENT:   CLINICAL IMPRESSION: Patient has no interest in TPDN.  Still unable to identify aggravation factors despite high pain levels.  Has not responded to piriformis releases.  Today focus was on strength, flexibility  and endurance.  Added reps as noted as well as PKB with strap to address persistent tight hip flexors.  OBJECTIVE IMPAIRMENTS: Abnormal gait, decreased endurance, decreased knowledge of condition, decreased mobility, difficulty walking, decreased ROM, decreased strength, hypomobility, increased fascial restrictions, increased muscle spasms, impaired flexibility, improper body mechanics, and pain.    ACTIVITY LIMITATIONS: carrying, lifting, bending, sitting, squatting, and stairs   PERSONAL FACTORS: Age and Time since onset of injury/illness/exacerbation are also affecting patient's functional outcome.      GOALS: Goals reviewed with patient? No   SHORT TERM GOALS: Target date: 12/22/2022   Patient to demonstrate independence in HEP  Baseline: [id number] Goal status: Met   2.  Decrease worst pain to 8/10 Baseline: 10/10; 01/04/23 8/10 Goal status: Met   3. Assess FOTO            Baseline: TBD due to poor vision            Goal status: Deferred     LONG TERM GOALS: Target date: 01/12/2023   6/10 worst pain Baseline: 10/10 Goal status: INITIAL   2.  60d SLR B Baseline: 45d SLR B; 01/04/23 60d SLR B Goal status: Met   3.  Decrease L piriformis tenderness to mild Baseline: severe; 01/04/23 moderate Goal status: Progressing   4.  Increase L hip IR to 10d  Baseline: 0d;  01/04/23 10d Goal status: Met   5.  Assess progress to FOTO goal Baseline: TBD Goal status: Deferred due to vision loss       PLAN:   PT FREQUENCY: 2x/week   PT DURATION: 6 weeks   PLANNED INTERVENTIONS: Therapeutic exercises, Therapeutic activity, Neuromuscular re-education, Balance training, Gait training, Patient/Family education, Self Care, Joint mobilization, Stair training, Dry Needling, Electrical stimulation, Manual therapy, and Re-evaluation.   PLAN FOR NEXT SESSION: HEP review and update, LE stretch and strength TPDN, manual as needed, flexibility   Hildred Laser, PT 01/04/2023, 2:42 PM

## 2023-01-04 ENCOUNTER — Ambulatory Visit: Payer: 59

## 2023-01-04 DIAGNOSIS — M5416 Radiculopathy, lumbar region: Secondary | ICD-10-CM

## 2023-01-04 DIAGNOSIS — M5459 Other low back pain: Secondary | ICD-10-CM | POA: Diagnosis not present

## 2023-01-04 DIAGNOSIS — G5702 Lesion of sciatic nerve, left lower limb: Secondary | ICD-10-CM | POA: Diagnosis not present

## 2023-01-06 ENCOUNTER — Ambulatory Visit: Payer: 59

## 2023-01-06 DIAGNOSIS — M5459 Other low back pain: Secondary | ICD-10-CM

## 2023-01-06 DIAGNOSIS — G5702 Lesion of sciatic nerve, left lower limb: Secondary | ICD-10-CM | POA: Diagnosis not present

## 2023-01-06 DIAGNOSIS — M5416 Radiculopathy, lumbar region: Secondary | ICD-10-CM

## 2023-01-06 NOTE — Therapy (Signed)
OUTPATIENT PHYSICAL THERAPY TREATMENT NOTE   Patient Name: DEMICO DREWETT MRN: [id number] DOB:June 03, 1956, 67 y.o., male Today's Date: 01/06/2023  PCP: Glendale Chard, MD  REFERRING PROVIDER: Aundra Dubin, PA-C   END OF SESSION:   PT End of Session - 01/06/23 1444     Visit Number 8    Number of Visits 12    Date for PT Re-Evaluation 01/26/23    Authorization Type UHC MCR    PT Start Time [id number]    PT Stop Time [id number]    PT Time Calculation (min) 40 min    Activity Tolerance Patient tolerated treatment well    Behavior During Therapy WFL for tasks assessed/performed                Past Medical History:  Diagnosis Date   Blindness, legal 12/18/2011   CAD (coronary artery disease), known LAD stent placed in 2007 in [place]. 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 eye with blurred vision 12/18/2011   HTN (hypertension) 12/18/2011   Hypertension    Mixed hyperlipidemia 09/14/2022   Rectal bleeding 03/31/2021   Past Surgical History:  Procedure Laterality Date   CARDIAC SURGERY     stent placed in 2007   [place]  2008   STENT PLACED IN LAD.MILD DIFFUSE IN-STENT RESTENOSIS IN 2010.   CORONARY STENT PLACEMENT     MYOCARDIAL PERFUSION STUDY  01/19/13   NORMAL STRESS NUCLEAR STUDY.   TRANSTHORACIC ECHOCARDIOGRAM  07/28/2009   LV= MILD LVH. EF 60% TO 65%.   VASCULAR US  01/15/09   NORMAL LOWER ARTERIAL DOPPLER   Patient Active Problem List   Diagnosis Date Noted   Mixed hyperlipidemia 09/14/2022   BMI 25.0-25.9,adult 05/07/2022   Abnormal weight loss 03/31/2021   Hemorrhoids 03/31/2021   [place] 03/31/2021   Encounter for general adult medical examination without abnormal findings 03/31/2021   Impaired functional mobility,  balance, gait, and endurance 03/31/2021   Other long term (current) drug therapy 03/31/2021   Second degree hemorrhoids 03/31/2021   Type 2 diabetes mellitus with diabetic neuropathy ([place]) 03/31/2021   Diabetic mononeuropathy associated with type 2 diabetes mellitus ([place]) 08/21/2019   Benign hypertensive heart disease without heart failure 08/21/2019   Coronary atherosclerosis due to calcified coronary lesion (CODE) 08/21/2019   Cervical radiculopathy at C6 11/17/2016   Unexplained night sweats 09/02/2015   Focal choroiditis and chorioretinitis, peripheral, bilateral 08/05/2015   Phthisis bulbi of right eye 06/17/2015   Hypertensive heart disease without heart failure 06/17/2015   Absolute glaucoma of left eye 06/17/2015   High risk medication use 04/27/2013   Aftercare following surgery of the musculoskeletal system, NEC 01/10/2013   Status post cervical arthrodesis 12/13/2012   Diabetes mellitus type 2 in nonobese ([place]) 07/06/2012   Immunosuppression ([place]) 01/11/2012   Chronic idiopathic constipation 12/21/2011   Chest pain at rest 12/18/2011   CAD s/p LAD stent 2007 12/18/2011   Glaucoma, 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 eye with blurred vision 12/18/2011   Cystoid macular edema 09/14/2011  Panuveitis of both eyes 09/14/2011   Sarcoidosis of other sites 09/14/2011   Other mechanical complication of other ocular prosthetic devices, implants and grafts, initial encounter 07/28/2011    REFERRING DIAG: M25.552 (ICD-10-CM) - Pain in left hip   THERAPY DIAG:  Other low back pain  Radiculopathy, lumbar region  Piriformis syndrome of left side  Rationale for Evaluation and Treatment Rehabilitation  PERTINENT HISTORY: Visual impairments  PRECAUTIONS: None   SUBJECTIVE:                                                                                                                                                                                       SUBJECTIVE STATEMENT: 0/10 pain now not 7/10 pain last night, no aggravating/relieving factor noted, does not feel DN will help   PAIN:  Are you having pain?  No: NPRS scale: 0/10 Pain location: L hip LE Pain description: ache Aggravating factors: undetermined Relieving factors: lying down   OBJECTIVE: (objective measures completed at initial evaluation unless otherwise dated)  DIAGNOSTIC FINDINGS:  Degenerative changes L5-S1 X-rays demonstrate significant osteophyte formation to the acetabulum.   Mild joint space narrowing otherwise.   PATIENT SURVEYS:  FOTO UTA due to poor vision   SCREENING FOR RED FLAGS: neg       MUSCLE LENGTH: Hamstrings: Right 45 deg; Left 45 deg     POSTURE: No Significant postural limitations   PALPATION: Marked TTP L piriformis muscle   LUMBAR ROM:    AROM eval  Flexion    Extension    Right lateral flexion    Left lateral flexion    Right rotation    Left rotation     (Blank rows = not tested)   LOWER EXTREMITY ROM:      Passive  Right eval Left eval  Hip flexion      Hip extension      Hip abduction      Hip adduction      Hip internal rotation   0d  Hip external rotation      Knee flexion      Knee extension      Ankle dorsiflexion      Ankle plantarflexion      Ankle inversion      Ankle eversion       (Blank rows = not tested)   LOWER EXTREMITY MMT:     MMT Right eval Left eval  Hip flexion 4 4  Hip extension 4 4  Hip abduction 4 4  Hip adduction      Hip internal rotation      Hip external rotation      Knee flexion  4 4  Knee extension 4 4  Ankle dorsiflexion      Ankle plantarflexion 4 4  Ankle inversion      Ankle eversion       (Blank rows = not tested)   LUMBAR SPECIAL TESTS:  Straight leg raise test: Negative, Slump test:  Negative, and Single leg stance test: NT   FUNCTIONAL TESTS:  5 times sit to stand: 13s arms crossed   GAIT: Distance walked: 36ftx2 Assistive device utilized: None Level of assistance: Complete Independence Comments: antalgic pattern   TREATMENT: [facility] Adult PT Treatment:                                                DATE: 01/06/23 Therapeutic Exercise: NuStep lvl 5 8 min QL stretch 30s x2 Bil Supine L hip flexor stretch 30s x2 Bridge with 5# ball 15x2 Single leg bridge 15/15  Supine clamshell x15 Bil, 15/15 unilateral  BluTB Seated hamstring stretch 2x30" Supine L piriformis stretch fig 4 direction 30s x2 L PKB with strap 30s x2 90/90 30s x2 Prone press 10x f/b 10x w/OP PA mobs L5-1 Grade III 10x per segment  [facility] Adult PT Treatment:                                                DATE: 01/04/23 Therapeutic Exercise: NuStep lvl 4 8 min QL stretch 30s x2 Bil Supine L hip flexor stretch 30s x2 Bridge with 5# ball 15x2 Single leg bridge 15/15  Supine clamshell x15 Bil, 15/15 unilateral  GTB Seated hamstring stretch 2x30" Supine L piriformis stretch fig 4 direction 30s x2 L PKB with strap 30s x2 90/90 30s x2  [facility] Adult PT Treatment:                                                DATE: 12/28/22 Therapeutic Exercise: NuStep lvl 4 8 min QL stretch 30s x2 Bil Supine L hip flexor stretch 30s x2 Bridge with 5# ball 15x Single leg bridge 15/15  Supine clamshell x15 Bil, 15/15 unilateral  GTB Seated hamstring stretch 2x30"  Manual Therapy: L piriformis release 8 min R S/L   PATIENT EDUCATION:  Education details: Discussed eval findings, rehab rationale and POC and patient is in agreement  Person educated: Patient Education method: Explanation and Handouts Education comprehension: verbalized understanding and needs further education   HOME EXERCISE PROGRAM: Access Code: [email or web address] URL: [email or web address] Date: 12/14/2022 Prepared by: Octavio Manns  Exercises - Clamshell  - 2 x daily - 5 x weekly - 1 sets - 15 reps - Seated Table Hamstring Stretch  - 2 x daily - 5 x weekly - 1 sets - 2 reps - 30s hold - Hooklying Single Knee to Chest Stretch  - 2 x daily - 5 x weekly - 1 sets - 2 reps - 30s hold - Supine Bridge  - 1 x daily - 5 x weekly - 2 sets - 10 reps - Supine Piriformis Stretch with Leg Straight  - 1 x daily - 5 x weekly - 2 reps - 30 sec hold -  Seated Piriformis Stretch with Trunk Bend  - 1 x daily - 5 x weekly - 2 reps - 30 sec hold   ASSESSMENT:   CLINICAL IMPRESSION: Overall minimal benefit from OPPT to date, recommended patient f/u with ortho office to pursue additional treatment options in lieu of hesitation regarding TPDN.  All interventions to date due to yield lasting results b/t treatment sessions.  L piriformis muscle remains aggravated due to active TP.  Increased resistance and difficulty of tasks as noted.  Added activities to promote lumbar extension using manusl techniques  OBJECTIVE IMPAIRMENTS: Abnormal gait, decreased endurance, decreased knowledge of condition, decreased mobility, difficulty walking, decreased ROM, decreased strength, hypomobility, increased fascial restrictions, increased muscle spasms, impaired flexibility, improper body mechanics, and pain.    ACTIVITY LIMITATIONS: carrying, lifting, bending, sitting, squatting, and stairs   PERSONAL FACTORS: Age and Time since onset of injury/illness/exacerbation are also affecting patient's functional outcome.      GOALS: Goals reviewed with patient? No   SHORT TERM GOALS: Target date: 12/22/2022   Patient to demonstrate independence in HEP  Baseline: [place] Goal status: Met   2.  Decrease worst pain to 8/10 Baseline: 10/10; 01/04/23 8/10 Goal status: Met   3. Assess FOTO            Baseline: TBD due to poor vision            Goal status: Deferred     LONG TERM GOALS: Target date: 01/12/2023   6/10 worst pain Baseline: 10/10 Goal  status: INITIAL   2.  60d SLR B Baseline: 45d SLR B; 01/04/23 60d SLR B Goal status: Met   3.  Decrease L piriformis tenderness to mild Baseline: severe; 01/04/23 moderate Goal status: Progressing   4.  Increase L hip IR to 10d  Baseline: 0d; 01/04/23 10d Goal status: Met   5.  Assess progress to FOTO goal Baseline: TBD Goal status: Deferred due to vision loss       PLAN:   PT FREQUENCY: 2x/week   PT DURATION: 6 weeks   PLANNED INTERVENTIONS: Therapeutic exercises, Therapeutic activity, Neuromuscular re-education, Balance training, Gait training, Patient/Family education, Self Care, Joint mobilization, Stair training, Dry Needling, Electrical stimulation, Manual therapy, and Re-evaluation.   PLAN FOR NEXT SESSION: HEP review and update, LE stretch and strength TPDN, manual as needed, flexibility   Lanice Shirts, PT 01/06/2023, 3:32 PM

## 2023-01-10 ENCOUNTER — Ambulatory Visit (INDEPENDENT_AMBULATORY_CARE_PROVIDER_SITE_OTHER): Payer: 59 | Admitting: Internal Medicine

## 2023-01-10 ENCOUNTER — Encounter: Payer: Self-pay | Admitting: Internal Medicine

## 2023-01-10 VITALS — BP 122/78 | HR 70 | Temp 98.0°F | Ht 64.0 in | Wt 162.2 lb

## 2023-01-10 DIAGNOSIS — D849 Immunodeficiency, unspecified: Secondary | ICD-10-CM

## 2023-01-10 DIAGNOSIS — I119 Hypertensive heart disease without heart failure: Secondary | ICD-10-CM

## 2023-01-10 DIAGNOSIS — I2510 Atherosclerotic heart disease of native coronary artery without angina pectoris: Secondary | ICD-10-CM | POA: Diagnosis not present

## 2023-01-10 DIAGNOSIS — E663 Overweight: Secondary | ICD-10-CM

## 2023-01-10 DIAGNOSIS — E1140 Type 2 diabetes mellitus with diabetic neuropathy, unspecified: Secondary | ICD-10-CM

## 2023-01-10 DIAGNOSIS — Z0001 Encounter for general adult medical examination with abnormal findings: Secondary | ICD-10-CM | POA: Diagnosis not present

## 2023-01-10 DIAGNOSIS — Z6827 Body mass index (BMI) 27.0-27.9, adult: Secondary | ICD-10-CM

## 2023-01-10 DIAGNOSIS — Z0000 Encounter for general adult medical examination without abnormal findings: Secondary | ICD-10-CM

## 2023-01-10 LAB — POCT URINALYSIS DIPSTICK

## 2023-01-10 LAB — CMP14+EGFR
ALT: 19 IU/L (ref 0–44)
AST: 20 IU/L (ref 0–40)
Albumin/Globulin Ratio: 1.6 (ref 1.2–2.2)
Albumin: 4.6 g/dL (ref 3.9–4.9)
Alkaline Phosphatase: 59 IU/L (ref 44–121)
BUN/Creatinine Ratio: 9 — ABNORMAL LOW (ref 10–24)
BUN: 11 mg/dL (ref 8–27)
Bilirubin Total: 1 mg/dL (ref 0.0–1.2)
CO2: 23 mmol/L (ref 20–29)
Calcium: 9.8 mg/dL (ref 8.6–10.2)
Chloride: 102 mmol/L (ref 96–106)
Creatinine, Ser: 1.19 mg/dL (ref 0.76–1.27)
Globulin, Total: 2.8 g/dL (ref 1.5–4.5)
Glucose: 106 mg/dL — ABNORMAL HIGH (ref 70–99)
Potassium: 4.5 mmol/L (ref 3.5–5.2)
Sodium: 140 mmol/L (ref 134–144)
Total Protein: 7.4 g/dL (ref 6.0–8.5)
eGFR: 68 mL/min/{1.73_m2} (ref >59)

## 2023-01-10 LAB — LIPID PANEL
Chol/HDL Ratio: 2.1 ratio (ref 0.0–5.0)
Cholesterol, Total: 178 mg/dL (ref 100–199)
HDL: 85 mg/dL (ref >39)
LDL Chol Calc (NIH): 82 mg/dL (ref 0–99)
Triglycerides: 56 mg/dL (ref 0–149)
VLDL Cholesterol Cal: 11 mg/dL (ref 5–40)

## 2023-01-10 LAB — HEMOGLOBIN A1C
Est. average glucose Bld gHb Est-mCnc: 120 mg/dL
Hgb A1c MFr Bld: 5.8 % — ABNORMAL HIGH (ref 4.8–5.6)

## 2023-01-10 MED ORDER — PREGABALIN 50 MG PO CAPS: 50.0000 mg | ORAL_CAPSULE | Freq: Every day | ORAL | 1 refills | Status: DC

## 2023-01-10 NOTE — Therapy (Unsigned)
OUTPATIENT PHYSICAL THERAPY TREATMENT NOTE   Patient Name: Steven [name] MRN: [id number] DOB:[birth year], 67 y.o., male Today's Date: 01/11/2023  PCP: Glendale Chard, MD  REFERRING PROVIDER: Aundra Dubin, PA-C   END OF SESSION:   PT End of Session - 01/11/23 1408     Visit Number 9    Number of Visits 12    Date for PT Re-Evaluation 01/26/23    Authorization Type UHC MCR    PT Start Time 1400    PT Stop Time [id number]    PT Time Calculation (min) 45 min    Activity Tolerance Patient tolerated treatment well    Behavior During Therapy WFL for tasks assessed/performed                 Past Medical History:  Diagnosis Date   Blindness, legal 12/18/2011   CAD (coronary artery disease), known LAD stent placed in 2007 in [place]. 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 eye with blurred vision 12/18/2011   HTN (hypertension) 12/18/2011   Hypertension    Mixed hyperlipidemia 09/14/2022   Rectal bleeding 03/31/2021   Past Surgical History:  Procedure Laterality Date   CARDIAC SURGERY     stent placed in 2007   [place]  2008   STENT PLACED IN LAD.MILD DIFFUSE IN-STENT RESTENOSIS IN 2010.   CORONARY STENT PLACEMENT     MYOCARDIAL PERFUSION STUDY  01/19/13   NORMAL STRESS NUCLEAR STUDY.   TRANSTHORACIC ECHOCARDIOGRAM  07/28/2009   LV= MILD LVH. EF 60% TO 65%.   VASCULAR US  01/15/09   NORMAL LOWER ARTERIAL DOPPLER   Patient Active Problem List   Diagnosis Date Noted   Mixed hyperlipidemia 09/14/2022   BMI 25.0-25.9,adult 05/07/2022   Abnormal weight loss 03/31/2021   Hemorrhoids 03/31/2021   [place] 03/31/2021   Encounter for general adult medical examination without abnormal findings 03/31/2021   Impaired functional mobility,  balance, gait, and endurance 03/31/2021   Other long term (current) drug therapy 03/31/2021   Second degree hemorrhoids 03/31/2021   Type 2 diabetes mellitus with diabetic neuropathy 03/31/2021   Diabetic mononeuropathy associated with type 2 diabetes mellitus 08/21/2019   Benign hypertensive heart disease without heart failure 08/21/2019   Coronary atherosclerosis due to calcified coronary lesion (CODE) 08/21/2019   Cervical radiculopathy at C6 11/17/2016   Unexplained night sweats 09/02/2015   Focal choroiditis and chorioretinitis, peripheral, bilateral 08/05/2015   Phthisis bulbi of right eye 06/17/2015   Hypertensive heart disease without heart failure 06/17/2015   Absolute glaucoma of left eye 06/17/2015   High risk medication use 04/27/2013   Aftercare following surgery of the musculoskeletal system, NEC 01/10/2013   Status post cervical arthrodesis 12/13/2012   Diabetes mellitus type 2 in nonobese 07/06/2012   Immunosuppression 01/11/2012   Chronic idiopathic constipation 12/21/2011   Chest pain at rest 12/18/2011   CAD s/p LAD stent 2007 12/18/2011   Glaucoma, 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 eye with blurred vision 12/18/2011   Cystoid macular edema 09/14/2011   Panuveitis of both  eyes 09/14/2011   Sarcoidosis of other sites 09/14/2011   Other mechanical complication of other ocular prosthetic devices, implants and grafts, initial encounter 07/28/2011    REFERRING DIAG: M25.552 (ICD-10-CM) - Pain in left hip   THERAPY DIAG:  Other low back pain  Radiculopathy, lumbar region  Piriformis syndrome of left side  Rationale for Evaluation and Treatment Rehabilitation  PERTINENT HISTORY: Visual impairments  PRECAUTIONS: None   SUBJECTIVE:                                                                                                                                                                                       SUBJECTIVE STATEMENT: Saw PCP and was placed on gabapentin for LLE symptoms.  Awoke with LLE sypmtoms lasting for several hrs but they eventually resolved.   PAIN:  Are you having pain?  No: NPRS scale: 0/10 Pain location: L hip LE Pain description: ache Aggravating factors: undetermined Relieving factors: lying down   OBJECTIVE: (objective measures completed at initial evaluation unless otherwise dated)  DIAGNOSTIC FINDINGS:  Degenerative changes L5-S1 X-rays demonstrate significant osteophyte formation to the acetabulum.   Mild joint space narrowing otherwise.   PATIENT SURVEYS:  FOTO UTA due to poor vision   SCREENING FOR RED FLAGS: neg       MUSCLE LENGTH: Hamstrings: Right 45 deg; Left 45 deg     POSTURE: No Significant postural limitations   PALPATION: Marked TTP L piriformis muscle   LUMBAR ROM:    AROM eval  Flexion    Extension    Right lateral flexion    Left lateral flexion    Right rotation    Left rotation     (Blank rows = not tested)   LOWER EXTREMITY ROM:      Passive  Right eval Left eval  Hip flexion      Hip extension      Hip abduction      Hip adduction      Hip internal rotation   0d  Hip external rotation      Knee flexion      Knee extension      Ankle dorsiflexion      Ankle plantarflexion      Ankle inversion      Ankle eversion       (Blank rows = not tested)   LOWER EXTREMITY MMT:     MMT Right eval Left eval  Hip flexion 4 4  Hip extension 4 4  Hip abduction 4 4  Hip adduction      Hip internal rotation      Hip external rotation  Knee flexion 4 4  Knee extension 4 4  Ankle dorsiflexion      Ankle plantarflexion 4 4  Ankle inversion      Ankle eversion       (Blank rows = not tested)   LUMBAR SPECIAL TESTS:  Straight leg raise test: Negative, Slump test:  Negative, and Single leg stance test: NT   FUNCTIONAL TESTS:  5 times sit to stand: 13s arms crossed   GAIT: Distance walked: 75ftx2 Assistive device utilized: None Level of assistance: Complete Independence Comments: antalgic pattern   TREATMENT: [facility] Adult PT Treatment:                                                DATE: 01/11/23 Therapeutic Exercise: NuStep lvl 6 8 min QL stretch 30s x2 Bil Supine L hip flexor stretch 30s x2 Bridge with 5# ball 15x2 Single leg bridge 15/15  Supine clamshell x15 Bil, 15/15 unilateral  BluTB S/L clams L 15x2 BluTB Seated hamstring stretch 2x30" 90/90 30s x2 Prone press 10x f/b 10x w/OP PA mobs L5-1 Grade III 10x per segment  [facility] Adult PT Treatment:                                                DATE: 01/06/23 Therapeutic Exercise: NuStep lvl 5 8 min QL stretch 30s x2 Bil Supine L hip flexor stretch 30s x2 Bridge with 5# ball 15x2 Single leg bridge 15/15  Supine clamshell x15 Bil, 15/15 unilateral  BluTB Seated hamstring stretch 2x30" Supine L piriformis stretch fig 4 direction 30s x2 L PKB with strap 30s x2 90/90 30s x2 Prone press 10x f/b 10x w/OP PA mobs L5-1 Grade III 10x per segment  [facility] Adult PT Treatment:                                                DATE: 01/04/23 Therapeutic Exercise: NuStep lvl 4 8 min QL stretch 30s x2 Bil Supine B hip flexor stretch 30s x2 Bridge with 5# ball 15x2 Single leg bridge 15/15  Supine clamshell x15 Bil, 15/15 unilateral  GTB Seated hamstring stretch 2x30" Supine L piriformis stretch fig 4 direction 30s x2 L PKB with strap 30s x2 90/90 30s x2  [facility] Adult PT Treatment:                                                DATE: 12/28/22 Therapeutic Exercise: NuStep lvl 4 8 min QL stretch 30s x2 Bil Supine L hip flexor stretch 30s x2 Bridge with 5# ball 15x Single leg bridge 15/15  Supine clamshell x15 Bil, 15/15 unilateral  GTB Seated hamstring stretch 2x30"  Manual Therapy: L piriformis  release 8 min R S/L   PATIENT EDUCATION:  Education details: Discussed eval findings, rehab rationale and POC and patient is in agreement  Person educated: Patient Education method: Explanation and Handouts Education comprehension: verbalized understanding and needs further education   HOME EXERCISE PROGRAM:  Access Code: [email or web address] URL: [email or web address] Date: 12/14/2022 Prepared by: Octavio Manns  Exercises - Clamshell  - 2 x daily - 5 x weekly - 1 sets - 15 reps - Seated Table Hamstring Stretch  - 2 x daily - 5 x weekly - 1 sets - 2 reps - 30s hold - Hooklying Single Knee to Chest Stretch  - 2 x daily - 5 x weekly - 1 sets - 2 reps - 30s hold - Supine Bridge  - 1 x daily - 5 x weekly - 2 sets - 10 reps - Supine Piriformis Stretch with Leg Straight  - 1 x daily - 5 x weekly - 2 reps - 30 sec hold   - Seated Piriformis Stretch with Trunk Bend  - 1 x daily - 5 x weekly - 2 reps - 30 sec hold   ASSESSMENT:   CLINICAL IMPRESSION: Continued fluctuating symptoms with no benefit reported following last session where lumbar mobility was addressed.  Symptoms continue to apperar piriformis related vs radicular.  Will monitor benefit of recently prescribed gabapentin.  OBJECTIVE IMPAIRMENTS: Abnormal gait, decreased endurance, decreased knowledge of condition, decreased mobility, difficulty walking, decreased ROM, decreased strength, hypomobility, increased fascial restrictions, increased muscle spasms, impaired flexibility, improper body mechanics, and pain.    ACTIVITY LIMITATIONS: carrying, lifting, bending, sitting, squatting, and stairs   PERSONAL FACTORS: Age and Time since onset of injury/illness/exacerbation are also affecting patient's functional outcome.      GOALS: Goals reviewed with patient? No   SHORT TERM GOALS: Target date: 12/22/2022   Patient to demonstrate independence in HEP  Baseline: [place] Goal status: Met   2.  Decrease worst pain to  8/10 Baseline: 10/10; 01/04/23 8/10 Goal status: Met   3. Assess FOTO            Baseline: TBD due to poor vision            Goal status: Deferred     LONG TERM GOALS: Target date: 01/12/2023   6/10 worst pain Baseline: 10/10 Goal status: INITIAL   2.  60d SLR B Baseline: 45d SLR B; 01/04/23 60d SLR B Goal status: Met   3.  Decrease L piriformis tenderness to mild Baseline: severe; 01/04/23 moderate Goal status: Progressing   4.  Increase L hip IR to 10d  Baseline: 0d; 01/04/23 10d Goal status: Met   5.  Assess progress to FOTO goal Baseline: TBD Goal status: Deferred due to vision loss       PLAN:   PT FREQUENCY: 2x/week   PT DURATION: 6 weeks   PLANNED INTERVENTIONS: Therapeutic exercises, Therapeutic activity, Neuromuscular re-education, Balance training, Gait training, Patient/Family education, Self Care, Joint mobilization, Stair training, Dry Needling, Electrical stimulation, Manual therapy, and Re-evaluation.   PLAN FOR NEXT SESSION: HEP review and update, LE stretch and strength TPDN, manual as needed, flexibility   Lanice Shirts, PT 01/11/2023, 2:46 PM

## 2023-01-10 NOTE — Progress Notes (Signed)
I,[name],acting as a scribe for Gwynneth Aliment, MD.,have documented all relevant documentation on the behalf of Gwynneth Aliment, MD,as directed by  Gwynneth Aliment, MD while in the presence of Gwynneth Aliment, MD.   Subjective:     Patient ID: Steven [name] , male    DOB: [birth year] , 67 y.o.   MRN: [id number]   Chief Complaint  Patient presents with   Annual Exam   Diabetes   Hypertension    HPI  Pt presents for annual exam. He has no questions or concerns at the moment. He has been able to control his diabetes with diet/exercise. He denies headache, chest pain, SOB.   He is currently in PT for right leg & hip pain. He admits it is going well. He has had some improvement in his symptoms.   Diabetes He presents for his follow-up diabetic visit. He has type 2 diabetes mellitus. Pertinent negatives for diabetes include no blurred vision, no chest pain, no polydipsia, no polyphagia and no polyuria. There are no hypoglycemic complications. Risk factors for coronary artery disease include diabetes mellitus, dyslipidemia, male sex and hypertension. Current diabetic treatment includes diet. His weight is stable. He is following a diabetic diet. He participates in exercise daily. He does not see a podiatrist.Eye exam is current.  Hypertension This is a chronic problem. The current episode started more than 1 year ago. Pertinent negatives include no blurred vision or chest pain. Risk factors for coronary artery disease include male gender, diabetes mellitus and dyslipidemia. Hypertensive end-organ damage includes CAD/MI.     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 eye with blurred vision 12/18/2011   HTN (hypertension) 12/18/2011   Hypertension    Mixed hyperlipidemia 09/14/2022   Rectal bleeding 03/31/2021     Family History  Problem Relation Age of Onset   Heart attack Mother    Diabetes Mother    Hypertension Father      Current Outpatient Medications:    aspirin EC 81 MG tablet, Take 81 mg by mouth daily., Disp: , Rfl:    atorvastatin (LIPITOR) 40 MG tablet, Take 1 tablet (40 mg total) by mouth daily., Disp: 90 tablet, Rfl: 3   cycloSPORINE modified (NEORAL) 25 MG capsule, Take 25 mg by mouth 3 (three) times daily., Disp: , Rfl:    dorzolamide-timolol (COSOPT) 22.3-6.8 MG/ML ophthalmic solution, Place 1 drop into the left eye 2 (two) times daily., Disp: , Rfl:    folic acid (FOLVITE) 1 MG tablet, Take 1 mg by mouth daily. 4 tabs daily, Disp: , Rfl:    isosorbide mononitrate (IMDUR) 30 MG 24 hr tablet, Take 1 tablet (30 mg total) by mouth daily., Disp: 90 tablet, Rfl: 3   latanoprost (XALATAN) 0.005 % ophthalmic solution, Place 1 drop into both eyes at bedtime., Disp: , Rfl:    LINZESS 290 MCG CAPS capsule, Take 290 mcg by mouth daily., Disp: , Rfl:    lisinopril (ZESTRIL) 10 MG tablet, Take 1 tablet (10 mg total) by mouth daily., Disp: 90 tablet, Rfl: 1   methocarbamol (ROBAXIN-750) 750 MG tablet, Take  1 tablet (750 mg total) by mouth 2 (two) times daily as needed for muscle spasms., Disp: 20 tablet, Rfl: 2   prednisoLONE acetate (PRED FORTE) 1 % ophthalmic suspension, Place 1 drop into the left eye daily., Disp: , Rfl:    predniSONE (STERAPRED UNI-PAK 21 TAB) 10 MG (21) TBPK tablet, To be taken as directed, Disp: 21 tablet, Rfl: 0   sildenafil (REVATIO) 20 MG tablet, Take 1 tablet (20 mg total) by mouth as needed (Take one hour prior to sexual activity). Take one hour prior to sexual activity, Disp: 10 tablet, Rfl: 0   pregabalin (LYRICA) 50 MG capsule, Take 1 capsule (50 mg total) by mouth daily., Disp: 30  capsule, Rfl: 1   No Known Allergies   Men's preventive visit. Patient Health Questionnaire (PHQ-2) is  Flowsheet Row Office Visit from 01/10/2023 in [facility]  PHQ-2 Total Score 0     . Patient is on a healthy diet. Marital status: Divorced. Relevant history for alcohol use is:  Social History   Substance and Sexual Activity  Alcohol Use No   Alcohol/week: 0.0 standard drinks of alcohol  . Relevant history for tobacco use is:  Social History   Tobacco Use  Smoking Status Former   Packs/day: 0.75   Years: 20.00   Additional pack years: 0.00   Total pack years: 15.00   Types: Cigarettes   Start date: 10/11/1981   Quit date: 10/11/2001   Years since quitting: 21.2  Smokeless Tobacco Never  .   Review of Systems  Constitutional: Negative.   HENT: Negative.    Eyes: Negative.  Negative for blurred vision.  Respiratory: Negative.    Cardiovascular: Negative.  Negative for chest pain.  Gastrointestinal: Negative.   Endocrine: Negative.  Negative for polydipsia, polyphagia and polyuria.  Genitourinary: Negative.   Musculoskeletal: Negative.   Skin: Negative.   Allergic/Immunologic: Negative.   Neurological:  Positive for numbness.  Hematological: Negative.   Psychiatric/Behavioral: Negative.       Today's Vitals   01/10/23 1010  BP: 122/78  Pulse: 70  Temp: 98 F (36.7 C)  SpO2: 98%  Weight: 162 lb 3.2 oz (73.6 kg)  Height: 5\' 4"  (1.626 m)   Body mass index is 27.84 kg/m.  Wt Readings from Last 3 Encounters:  01/10/23 162 lb 3.2 oz (73.6 kg)  11/04/22 164 lb (74.4 kg)  11/04/22 164 lb 3.2 oz (74.5 kg)    Objective:  Physical Exam Vitals and nursing note reviewed.  Constitutional:      Appearance: Normal appearance.  HENT:     Head: Normocephalic and atraumatic.     Right Ear: Tympanic membrane, ear canal and external ear normal.     Left Ear: Tympanic membrane, ear canal and external ear normal.     Nose:     Comments:  Masked     Mouth/Throat:     Comments: Masked  Eyes:     Extraocular Movements: Extraocular movements intact.     Conjunctiva/sclera: Conjunctivae normal.     Pupils: Pupils are equal, round, and reactive to light.     Comments: Pupil scarring b/l  Cardiovascular:     Rate and Rhythm: Normal rate and regular rhythm.     Pulses: Normal pulses.          Dorsalis pedis pulses are 2+ on the right side and 2+ on the left side.     Heart sounds: Normal heart sounds.  Pulmonary:  Effort: Pulmonary effort is normal.     Breath sounds: Normal breath sounds.  Chest:  Breasts:    Right: Normal. No swelling, bleeding, inverted nipple, mass or nipple discharge.     Left: Normal. No swelling, bleeding, inverted nipple, mass or nipple discharge.  Abdominal:     General: Abdomen is flat. Bowel sounds are normal.     Palpations: Abdomen is soft.  Genitourinary:    Comments: Deferred  Musculoskeletal:        General: Normal range of motion.     Cervical back: Normal range of motion and neck supple.  Feet:     Right foot:     Protective Sensation: 5 sites tested.  5 sites sensed.     Skin integrity: Dry skin present.     Left foot:     Protective Sensation: 5 sites tested.  5 sites sensed.     Skin integrity: Dry skin present.  Skin:    General: Skin is warm.  Neurological:     General: No focal deficit present.     Mental Status: He is alert.  Psychiatric:        Mood and Affect: Mood normal.        Behavior: Behavior normal.         Assessment And Plan:    1. Encounter for general adult medical examination without abnormal findings Comments: A full exam was performed.  He declined DRE.  PATIENT IS ADVISED TO GET 30-45 MINUTES REGULAR EXERCISE NO LESS THAN FOUR TO FIVE DAYS PER WEEK - BOTH WEIGHTBEARING EXERCISES AND AEROBIC ARE RECOMMENDED.  PATIENT IS ADVISED TO FOLLOW A HEALTHY DIET WITH AT LEAST SIX FRUITS/VEGGIES PER DAY, DECREASE INTAKE OF RED MEAT, AND TO INCREASE FISH  INTAKE TO TWO DAYS PER WEEK.  MEATS/FISH SHOULD NOT BE FRIED, BAKED OR BROILED IS PREFERABLE.  IT IS ALSO IMPORTANT TO CUT BACK ON YOUR SUGAR INTAKE. PLEASE AVOID ANYTHING WITH ADDED SUGAR, CORN SYRUP OR OTHER SWEETENERS. IF YOU MUST USE A SWEETENER, YOU CAN TRY STEVIA. IT IS ALSO IMPORTANT TO AVOID ARTIFICIALLY SWEETENERS AND DIET BEVERAGES. LASTLY, I SUGGEST WEARING SPF 50 SUNSCREEN ON EXPOSED PARTS AND ESPECIALLY WHEN IN THE DIRECT SUNLIGHT FOR AN EXTENDED PERIOD OF TIME.  PLEASE AVOID FAST FOOD RESTAURANTS AND INCREASE YOUR WATER INTAKE.  2. Type 2 diabetes mellitus with diabetic neuropathy, without long-term current use of insulin Comments: Chronic, diabetic foot exam was performed. He has been able to  control DM w/ lifestyle changes. He will rto in six months for re-evaluation. I do plan to start SGLT2 inhibitor in the future. Pt advised that it decreases risk of both renal/cardiac complications. Ideally, he only wants to start meds if A1c worsens. Will make further recommendations once his labs are available for review.  - POCT Urinalysis Dipstick (81002) - CMP14+EGFR - Lipid panel - Hemoglobin A1c  3. Benign hypertensive heart disease without heart failure Comments: Chronic, well controlled. He will c/w lisinopril 10mg  daily. He is encouraged to follow low sodium diet. He will f/u in six months for re-evaluation.  - POCT Urinalysis Dipstick (81002)  4. Atherosclerosis of native coronary artery of native heart without angina pectoris Comments: Chronic, LDL goal < 70.  He will c/w ASA 81mg  and atorvastatin 40mg  daily.  5. Immunosuppression Comments: He is currently on cyclosporine.  6. Overweight with body mass index (BMI) of 27 to 27.9 in adult Comments: He is encouraged to aim for at least 150 minutes of exercise/week.  Patient  was given opportunity to ask questions. Patient verbalized understanding of the plan and was able to repeat key elements of the plan. All questions were  answered to their satisfaction.   I, Gwynneth Aliment, MD, have reviewed all documentation for this visit. The documentation on 01/10/23 for the exam, diagnosis, procedures, and orders are all accurate and complete.   THE PATIENT IS ENCOURAGED TO PRACTICE SOCIAL DISTANCING DUE TO THE COVID-19 PANDEMIC.

## 2023-01-10 NOTE — Patient Instructions (Signed)
Health Maintenance, Male Adopting a healthy lifestyle and getting preventive care are important in promoting health and wellness. Ask your health care provider about: The right schedule for you to have regular tests and exams. Things you can do on your own to prevent diseases and keep yourself healthy. What should I know about diet, weight, and exercise? Eat a healthy diet  Eat a diet that includes plenty of vegetables, fruits, low-fat dairy products, and lean protein. Do not eat a lot of foods that are high in solid fats, added sugars, or sodium. Maintain a healthy weight Body mass index (BMI) is a measurement that can be used to identify possible weight problems. It estimates body fat based on height and weight. Your health care provider can help determine your BMI and help you achieve or maintain a healthy weight. Get regular exercise Get regular exercise. This is one of the most important things you can do for your health. Most adults should: Exercise for at least 150 minutes each week. The exercise should increase your heart rate and make you sweat (moderate-intensity exercise). Do strengthening exercises at least twice a week. This is in addition to the moderate-intensity exercise. Spend less time sitting. Even light physical activity can be beneficial. Watch cholesterol and blood lipids Have your blood tested for lipids and cholesterol at 66 years of age, then have this test every 5 years. You may need to have your cholesterol levels checked more often if: Your lipid or cholesterol levels are high. You are older than 66 years of age. You are at high risk for heart disease. What should I know about cancer screening? Many types of cancers can be detected early and may often be prevented. Depending on your health history and family history, you may need to have cancer screening at various ages. This may include screening for: Colorectal cancer. Prostate cancer. Skin cancer. Lung  cancer. What should I know about heart disease, diabetes, and high blood pressure? Blood pressure and heart disease High blood pressure causes heart disease and increases the risk of stroke. This is more likely to develop in people who have high blood pressure readings or are overweight. Talk with your health care provider about your target blood pressure readings. Have your blood pressure checked: Every 3-5 years if you are 18-39 years of age. Every year if you are 40 years old or older. If you are between the ages of 65 and 75 and are a current or former smoker, ask your health care provider if you should have a one-time screening for abdominal aortic aneurysm (AAA). Diabetes Have regular diabetes screenings. This checks your fasting blood sugar level. Have the screening done: Once every three years after age 45 if you are at a normal weight and have a low risk for diabetes. More often and at a younger age if you are overweight or have a high risk for diabetes. What should I know about preventing infection? Hepatitis B If you have a higher risk for hepatitis B, you should be screened for this virus. Talk with your health care provider to find out if you are at risk for hepatitis B infection. Hepatitis C Blood testing is recommended for: Everyone born from 1945 through 1965. Anyone with known risk factors for hepatitis C. Sexually transmitted infections (STIs) You should be screened each year for STIs, including gonorrhea and chlamydia, if: You are sexually active and are younger than 66 years of age. You are older than 66 years of age and your   health care provider tells you that you are at risk for this type of infection. Your sexual activity has changed since you were last screened, and you are at increased risk for chlamydia or gonorrhea. Ask your health care provider if you are at risk. Ask your health care provider about whether you are at high risk for HIV. Your health care provider  may recommend a prescription medicine to help prevent HIV infection. If you choose to take medicine to prevent HIV, you should first get tested for HIV. You should then be tested every 3 months for as long as you are taking the medicine. Follow these instructions at home: Alcohol use Do not drink alcohol if your health care provider tells you not to drink. If you drink alcohol: Limit how much you have to 0-2 drinks a day. Know how much alcohol is in your drink. In the [place] one drink equals one 12 oz bottle of beer (355 mL), one 5 oz glass of wine (148 mL), or one 1 oz glass of hard liquor (44 mL). Lifestyle Do not use any products that contain nicotine or tobacco. These products include cigarettes, chewing tobacco, and vaping devices, such as e-cigarettes. If you need help quitting, ask your health care provider. Do not use street drugs. Do not share needles. Ask your health care provider for help if you need support or information about quitting drugs. General instructions Schedule regular health, dental, and eye exams. Stay current with your vaccines. Tell your health care provider if: You often feel depressed. You have ever been abused or do not feel safe at home. Summary Adopting a healthy lifestyle and getting preventive care are important in promoting health and wellness. Follow your health care provider's instructions about healthy diet, exercising, and getting tested or screened for diseases. Follow your health care provider's instructions on monitoring your cholesterol and blood pressure. This information is not intended to replace advice given to you by your health care provider. Make sure you discuss any questions you have with your health care provider. Document Revised: 02/16/2021 Document Reviewed: 02/16/2021 Elsevier Patient Education  2023 [employer]  

## 2023-01-11 ENCOUNTER — Ambulatory Visit: Payer: 59 | Attending: Physician Assistant

## 2023-01-11 DIAGNOSIS — M5416 Radiculopathy, lumbar region: Secondary | ICD-10-CM | POA: Diagnosis not present

## 2023-01-11 DIAGNOSIS — M5459 Other low back pain: Secondary | ICD-10-CM

## 2023-01-11 DIAGNOSIS — G5702 Lesion of sciatic nerve, left lower limb: Secondary | ICD-10-CM | POA: Diagnosis not present

## 2023-01-13 ENCOUNTER — Ambulatory Visit: Payer: 59

## 2023-01-13 DIAGNOSIS — G5702 Lesion of sciatic nerve, left lower limb: Secondary | ICD-10-CM | POA: Diagnosis not present

## 2023-01-13 DIAGNOSIS — M5459 Other low back pain: Secondary | ICD-10-CM

## 2023-01-13 DIAGNOSIS — M5416 Radiculopathy, lumbar region: Secondary | ICD-10-CM

## 2023-01-13 NOTE — Therapy (Signed)
OUTPATIENT PHYSICAL THERAPY TREATMENT NOTE/DISCHARGE  PHYSICAL THERAPY DISCHARGE SUMMARY  Visits from Start of Care: 10  Current functional level related to goals / functional outcomes: See goals and objective   Remaining deficits: See goals and objective   Education / Equipment: HEP   Patient agrees to discharge. Patient goals were not met. Patient is being discharged due to maximized rehab potential.    Patient Name: Steven [name] MRN: [id number] DOB:[birth year], 67 y.o., male Today's Date: 01/13/2023  PCP: Glendale Chard, MD  REFERRING PROVIDER: Aundra Dubin, PA-C   END OF SESSION:   PT End of Session - 01/13/23 1351     Visit Number 10    Number of Visits 12    Date for PT Re-Evaluation 01/26/23    Authorization Type [employer] MCR    PT Start Time 1400    Activity Tolerance Patient tolerated treatment well    Behavior During Therapy [facility] for tasks assessed/performed                  Past Medical History:  Diagnosis Date   Blindness, legal 12/18/2011   CAD (coronary artery disease), known LAD stent placed in 2007 in [place]. 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 eye with blurred vision 12/18/2011   HTN (hypertension) 12/18/2011   Hypertension    Mixed hyperlipidemia 09/14/2022   Rectal bleeding 03/31/2021   Past Surgical History:  Procedure Laterality Date   CARDIAC SURGERY     stent placed in 2007   [place]  2008   STENT PLACED IN LAD.MILD DIFFUSE IN-STENT RESTENOSIS IN 2010.   CORONARY STENT PLACEMENT     MYOCARDIAL PERFUSION STUDY  01/19/13   NORMAL STRESS NUCLEAR STUDY.   TRANSTHORACIC ECHOCARDIOGRAM  07/28/2009   LV= MILD LVH. EF 60% TO 65%.   VASCULAR US  01/15/09   NORMAL LOWER ARTERIAL DOPPLER   Patient  Active Problem List   Diagnosis Date Noted   Mixed hyperlipidemia 09/14/2022   BMI 25.0-25.9,adult 05/07/2022   Abnormal weight loss 03/31/2021   Hemorrhoids 03/31/2021   [place] 03/31/2021   Encounter for general adult medical examination without abnormal findings 03/31/2021   Impaired functional mobility, balance, gait, and endurance 03/31/2021   Other long term (current) drug therapy 03/31/2021   Second degree hemorrhoids 03/31/2021   Type 2 diabetes mellitus with diabetic neuropathy 03/31/2021   Diabetic mononeuropathy associated with type 2 diabetes mellitus 08/21/2019   Benign hypertensive heart disease without heart failure 08/21/2019   Coronary atherosclerosis due to calcified coronary lesion (CODE) 08/21/2019   Cervical radiculopathy at C6 11/17/2016   Unexplained night sweats 09/02/2015   Focal choroiditis and chorioretinitis, peripheral, bilateral 08/05/2015   Phthisis bulbi of right eye 06/17/2015   Hypertensive heart disease without heart failure 06/17/2015   Absolute glaucoma of left eye 06/17/2015   High risk medication use 04/27/2013   Aftercare following surgery of the musculoskeletal system, NEC 01/10/2013   Status post cervical arthrodesis 12/13/2012   Diabetes mellitus type 2 in nonobese 07/06/2012   Immunosuppression 01/11/2012   Chronic idiopathic constipation 12/21/2011   Chest pain at rest 12/18/2011   CAD s/p LAD stent 2007 12/18/2011   Glaucoma, 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 eye with blurred vision 12/18/2011   Cystoid macular edema 09/14/2011   Panuveitis of both eyes 09/14/2011   Sarcoidosis of other sites 09/14/2011   Other mechanical complication of other ocular prosthetic devices, implants and grafts, initial encounter 07/28/2011    REFERRING DIAG:  M25.552 (ICD-10-CM) - Pain in left hip   THERAPY DIAG:  Other low back pain  Radiculopathy, lumbar region  Piriformis syndrome of left side  Rationale for Evaluation and Treatment Rehabilitation  PERTINENT HISTORY: Visual impairments  PRECAUTIONS: None   SUBJECTIVE:                                                                                                                                                                                      SUBJECTIVE STATEMENT: Pt presents to PT with continued L hip pain. Has been compliant with HEP, notes no real change.    PAIN:  Are you having pain?  No: NPRS scale: 6/10 Pain location: L hip LE Pain description: ache Aggravating factors: undetermined Relieving factors: lying down   OBJECTIVE: (objective measures completed at initial evaluation unless otherwise dated)  DIAGNOSTIC FINDINGS:  Degenerative changes L5-S1 X-rays demonstrate significant osteophyte formation to the acetabulum.   Mild joint space narrowing otherwise.   PATIENT SURVEYS:  FOTO UTA due to poor vision   SCREENING FOR RED FLAGS: neg       MUSCLE LENGTH: Hamstrings: Right 45 deg; Left 45 deg     POSTURE: No Significant postural limitations   PALPATION: Marked TTP L piriformis muscle   LUMBAR ROM:    AROM eval  Flexion    Extension    Right lateral flexion    Left lateral flexion    Right rotation    Left rotation     (Blank rows = not tested)   LOWER EXTREMITY ROM:      Passive  Right eval Left eval  Hip flexion      Hip extension      Hip abduction      Hip adduction      Hip internal rotation   0d  Hip external rotation      Knee flexion      Knee extension      Ankle dorsiflexion      Ankle plantarflexion      Ankle inversion      Ankle eversion       (Blank rows = not tested)   LOWER EXTREMITY MMT:     MMT Right  eval Left eval  Hip flexion 4 4  Hip extension 4 4  Hip abduction 4 4  Hip adduction      Hip internal  rotation      Hip external rotation      Knee flexion 4 4  Knee extension 4 4  Ankle dorsiflexion      Ankle plantarflexion 4 4  Ankle inversion      Ankle eversion       (Blank rows = not tested)   LUMBAR SPECIAL TESTS:  Straight leg raise test: Negative, Slump test: Negative, and Single leg stance test: NT   FUNCTIONAL TESTS:  5 times sit to stand: 13s arms crossed   GAIT: Distance walked: 22ftx2 Assistive device utilized: None Level of assistance: Complete Independence Comments: antalgic pattern   TREATMENT: [facility] Adult PT Treatment:                                                DATE: 01/13/23 Therapeutic Exercise: NuStep lvl 5 x 5 min while taking subjective SLR x 15 Bridge x 15 S/L clamshell x 15 RTB Supine piriformis stretch x 30" L Seated fig 4 stretch x 30" L Trigger point release with tennis ball x 60" Therapeutic Activity: Assessment of tests/measures, goals, and outcomes for discharge  [facility] Adult PT Treatment:                                                DATE: 01/11/23 Therapeutic Exercise: NuStep lvl 6 8 min QL stretch 30s x2 Bil Supine L hip flexor stretch 30s x2 Bridge with 5# ball 15x2 Single leg bridge 15/15  Supine clamshell x15 Bil, 15/15 unilateral  BluTB S/L clams L 15x2 BluTB Seated hamstring stretch 2x30" 90/90 30s x2 Prone press 10x f/b 10x w/OP PA mobs L5-1 Grade III 10x per segment  [facility] Adult PT Treatment:                                                DATE: 01/06/23 Therapeutic Exercise: NuStep lvl 5 8 min QL stretch 30s x2 Bil Supine L hip flexor stretch 30s x2 Bridge with 5# ball 15x2 Single leg bridge 15/15  Supine clamshell x15 Bil, 15/15 unilateral  BluTB Seated hamstring stretch 2x30" Supine L piriformis stretch fig 4 direction 30s x2 L PKB with strap 30s x2 90/90 30s x2 Prone press 10x f/b 10x w/OP PA mobs L5-1 Grade III 10x per segment  PATIENT EDUCATION:  Education details: Discussed eval findings, rehab rationale and  POC and patient is in agreement  Person educated: Patient Education method: Explanation and Handouts Education comprehension: verbalized understanding and needs further education   HOME EXERCISE PROGRAM: Access Code: [email or web address] URL: [email or web address] Date: 01/13/2023 Prepared by: Octavio Manns  Exercises - Active Straight Leg Raise with Quad Set  - 3-4 x weekly - 3 sets - 110-15 reps - Supine Bridge  - 3-4 x weekly - 3 sets - 10-15 reps - Clamshell  - 3-4 x weekly - 1 sets - 15 reps - Supine Piriformis Stretch with Leg Straight  - 3-4 x  weekly - 2 reps - 30 sec hold - Seated Piriformis Stretch with Trunk Bend  - 3-4 x weekly - 2 reps - 30 sec hold - Standing Piriformis Release with Ball at [employer]  - 3-4 x weekly - 2 reps - 2 min hold   ASSESSMENT:   CLINICAL IMPRESSION: Pt was able to complete prescribed exercises and demonstrated knowledge of HEP with no adverse effect. Over the course he has made some progress in functional objective improvement. Unfortunately he continues to have significant pain in L hip and L LE. At this point he has maximized his rehab potential and PT recommended discussing next steps with PCP and maybe getting referral to orthopedics. Pt in agreement with current plan, ready to d/c at this time.   OBJECTIVE IMPAIRMENTS: Abnormal gait, decreased endurance, decreased knowledge of condition, decreased mobility, difficulty walking, decreased ROM, decreased strength, hypomobility, increased fascial restrictions, increased muscle spasms, impaired flexibility, improper body mechanics, and pain.    ACTIVITY LIMITATIONS: carrying, lifting, bending, sitting, squatting, and stairs   PERSONAL FACTORS: Age and Time since onset of injury/illness/exacerbation are also affecting patient's functional outcome.      GOALS: Goals reviewed with patient? No   SHORT TERM GOALS: Target date: 12/22/2022   Patient to demonstrate independence in HEP  Baseline:  [place] Goal status: Met   2.  Decrease worst pain to 8/10 Baseline: 10/10; 01/04/23 8/10 Goal status: Met   3. Assess FOTO            Baseline: TBD due to poor vision            Goal status: Deferred     LONG TERM GOALS: Target date: 01/12/2023   6/10 worst pain Baseline: 10/10 Goal status: NOT MET   2.  60d SLR B Baseline: 45d SLR B; 01/04/23 60d SLR B Goal status: Met   3.  Decrease L piriformis tenderness to mild Baseline: severe; 01/04/23 moderate Goal status: Progressing   4.  Increase L hip IR to 10d  Baseline: 0d; 01/04/23 10d Goal status: Met   5.  Assess progress to FOTO goal Baseline: TBD Goal status: Deferred due to vision loss       PLAN:   PT FREQUENCY: 2x/week   PT DURATION: 6 weeks   PLANNED INTERVENTIONS: Therapeutic exercises, Therapeutic activity, Neuromuscular re-education, Balance training, Gait training, Patient/Family education, Self Care, Joint mobilization, Stair training, Dry Needling, Electrical stimulation, Manual therapy, and Re-evaluation.   PLAN FOR NEXT SESSION: HEP review and update, LE stretch and strength TPDN, manual as needed, flexibility   Ward Chatters, PT 01/13/2023, 2:29 PM

## 2023-01-17 ENCOUNTER — Other Ambulatory Visit: Payer: Self-pay

## 2023-01-17 DIAGNOSIS — R351 Nocturia: Secondary | ICD-10-CM

## 2023-01-18 ENCOUNTER — Other Ambulatory Visit: Payer: 59

## 2023-01-18 DIAGNOSIS — R351 Nocturia: Secondary | ICD-10-CM

## 2023-01-19 LAB — PSA: Prostate Specific Ag, Serum: 0.6 ng/mL (ref 0.0–4.0)

## 2023-01-24 ENCOUNTER — Other Ambulatory Visit: Payer: Self-pay

## 2023-01-24 DIAGNOSIS — E7800 Pure hypercholesterolemia, unspecified: Secondary | ICD-10-CM

## 2023-01-24 MED ORDER — ATORVASTATIN CALCIUM 40 MG PO TABS: 40.0000 mg | ORAL_TABLET | Freq: Every day | ORAL | 3 refills | Status: DC

## 2023-02-01 DIAGNOSIS — H30033 Focal chorioretinal inflammation, peripheral, bilateral: Secondary | ICD-10-CM | POA: Diagnosis not present

## 2023-02-01 DIAGNOSIS — Z79899 Other long term (current) drug therapy: Secondary | ICD-10-CM | POA: Diagnosis not present

## 2023-02-03 DIAGNOSIS — Z947 Corneal transplant status: Secondary | ICD-10-CM | POA: Diagnosis not present

## 2023-02-08 DIAGNOSIS — H35033 Hypertensive retinopathy, bilateral: Secondary | ICD-10-CM | POA: Diagnosis not present

## 2023-02-08 DIAGNOSIS — Z79899 Other long term (current) drug therapy: Secondary | ICD-10-CM | POA: Diagnosis not present

## 2023-02-08 DIAGNOSIS — H30033 Focal chorioretinal inflammation, peripheral, bilateral: Secondary | ICD-10-CM | POA: Diagnosis not present

## 2023-02-08 DIAGNOSIS — H402224 Chronic angle-closure glaucoma, left eye, indeterminate stage: Secondary | ICD-10-CM | POA: Diagnosis not present

## 2023-02-08 DIAGNOSIS — H4042X2 Glaucoma secondary to eye inflammation, left eye, moderate stage: Secondary | ICD-10-CM | POA: Diagnosis not present

## 2023-02-08 DIAGNOSIS — H3581 Retinal edema: Secondary | ICD-10-CM | POA: Diagnosis not present

## 2023-02-08 DIAGNOSIS — H44521 Atrophy of globe, right eye: Secondary | ICD-10-CM | POA: Diagnosis not present

## 2023-02-08 DIAGNOSIS — H5461 Unqualified visual loss, right eye, normal vision left eye: Secondary | ICD-10-CM | POA: Diagnosis not present

## 2023-02-08 DIAGNOSIS — Z947 Corneal transplant status: Secondary | ICD-10-CM | POA: Diagnosis not present

## 2023-02-08 DIAGNOSIS — H209 Unspecified iridocyclitis: Secondary | ICD-10-CM | POA: Diagnosis not present

## 2023-02-24 ENCOUNTER — Ambulatory Visit (INDEPENDENT_AMBULATORY_CARE_PROVIDER_SITE_OTHER): Payer: 59 | Admitting: Internal Medicine

## 2023-02-24 ENCOUNTER — Encounter: Payer: Self-pay | Admitting: Internal Medicine

## 2023-02-24 VITALS — BP 118/60 | HR 65 | Temp 97.7°F | Ht 64.0 in | Wt 163.2 lb

## 2023-02-24 DIAGNOSIS — E1140 Type 2 diabetes mellitus with diabetic neuropathy, unspecified: Secondary | ICD-10-CM

## 2023-02-24 DIAGNOSIS — Z2821 Immunization not carried out because of patient refusal: Secondary | ICD-10-CM

## 2023-02-24 DIAGNOSIS — Z6828 Body mass index (BMI) 28.0-28.9, adult: Secondary | ICD-10-CM

## 2023-02-24 MED ORDER — PREGABALIN 75 MG PO CAPS: ORAL_CAPSULE | ORAL | 2 refills | Status: DC

## 2023-02-24 NOTE — Progress Notes (Signed)
Jeri Cos [place],acting as a Neurosurgeon for Gwynneth Aliment, MD.,have documented all relevant documentation on the behalf of Gwynneth Aliment, MD,as directed by  Gwynneth Aliment, MD while in the presence of Gwynneth Aliment, MD.    Subjective:     Patient ID: Steven [name] , male    DOB: 03-19-1956 , 67 y.o.   MRN: [id number]   Chief Complaint  Patient presents with   med check    HPI  Patient presents today for Lyrica follow up, patient reports the Lyrica is working for him; however, his sx have not completely resolved. Patient does not have any questions or concerns at this time.  Wt Readings from Last 3 Encounters: 02/24/23 : 163 lb 3.2 oz (74 kg) 01/10/23 : 162 lb 3.2 oz (73.6 kg) 11/04/22 : 164 lb (74.4 kg)       Past Medical History:  Diagnosis Date   Blindness, legal 12/18/2011   CAD (coronary artery disease), known LAD stent placed in 2007 in [place] 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 eye with blurred vision 12/18/2011   HTN (hypertension) 12/18/2011   Hypertension    Mixed hyperlipidemia 09/14/2022   Rectal bleeding 03/31/2021     Family History  Problem Relation Age of Onset   Heart attack Mother    Diabetes Mother    Hypertension Father      Current Outpatient Medications:    aspirin EC 81 MG tablet, Take 81 mg by mouth daily., Disp: , Rfl:    atorvastatin (LIPITOR) 40 MG tablet, Take 1 tablet (40 mg total) by mouth daily. Take 1 tablet Monday-Saturday!, Disp: 90 tablet, Rfl: 3   cycloSPORINE modified (NEORAL) 25 MG capsule, Take 25 mg by mouth 3 (three) times daily., Disp: , Rfl:    dorzolamide-timolol (COSOPT) 22.3-6.8 MG/ML ophthalmic solution, Place 1 drop into the left eye 2 (two) times daily., Disp: , Rfl:    folic acid (FOLVITE) 1 MG  tablet, Take 1 mg by mouth daily. 4 tabs daily, Disp: , Rfl:    isosorbide mononitrate (IMDUR) 30 MG 24 hr tablet, Take 1 tablet (30 mg total) by mouth daily., Disp: 90 tablet, Rfl: 3   latanoprost (XALATAN) 0.005 % ophthalmic solution, Place 1 drop into both eyes at bedtime., Disp: , Rfl:    LINZESS 290 MCG CAPS capsule, Take 290 mcg by mouth daily., Disp: , Rfl:    lisinopril (ZESTRIL) 10 MG tablet, Take 1 tablet (10 mg total) by mouth daily., Disp: 90 tablet, Rfl: 1   methocarbamol (ROBAXIN-750) 750 MG tablet, Take 1 tablet (750 mg total) by mouth 2 (two) times daily as needed for muscle spasms., Disp: 20 tablet, Rfl: 2   prednisoLONE acetate (PRED FORTE) 1 % ophthalmic suspension, Place 1 drop into the left eye daily., Disp: , Rfl:    pregabalin (LYRICA) 75 MG capsule, One cap po qpm, Disp: 30 capsule, Rfl: 2   sildenafil (REVATIO) 20 MG tablet, Take 1 tablet (20 mg total) by mouth as needed (Take one hour prior to sexual activity). Take one hour prior to sexual activity, Disp: 10 tablet, Rfl: 0   No Known Allergies   Review of Systems  Constitutional: Negative.   Eyes: Negative.   Respiratory: Negative.  Cardiovascular: Negative.   Gastrointestinal: Negative.   Endocrine: Negative for polydipsia, polyphagia and polyuria.  Musculoskeletal: Negative.   Skin: Negative.   Neurological: Negative.   Psychiatric/Behavioral: Negative.       Today's Vitals   02/24/23 1043  BP: 118/60  Pulse: 65  Temp: 97.7 F (36.5 C)  Weight: 163 lb 3.2 oz (74 kg)  Height: 5\' 4"  (1.626 m)  PainSc: 6   PainLoc: Back   Body mass index is 28.01 kg/m.  The 10-year ASCVD risk score (Arnett DK, et al., 2019) is: 20.9%   Values used to calculate the score:     Age: 81 years     Sex: Male     Is Non-Hispanic African American: Yes     Diabetic: Yes     Tobacco smoker: No     Systolic Blood Pressure: 118 mmHg     Is BP treated: Yes     HDL Cholesterol: 85 mg/dL     Total Cholesterol: 178  mg/dL ++ Objective:  Physical Exam Vitals and nursing note reviewed.  Constitutional:      Appearance: Normal appearance.  HENT:     Head: Normocephalic and atraumatic.  Eyes:     Extraocular Movements: Extraocular movements intact.  Cardiovascular:     Rate and Rhythm: Normal rate and regular rhythm.     Heart sounds: Normal heart sounds.  Pulmonary:     Effort: Pulmonary effort is normal.     Breath sounds: Normal breath sounds.  Musculoskeletal:     Cervical back: Normal range of motion.  Skin:    General: Skin is warm.  Neurological:     General: No focal deficit present.     Mental Status: He is alert.  Psychiatric:        Mood and Affect: Mood normal.         Assessment And Plan:     1. Type 2 diabetes mellitus with diabetic neuropathy, without long-term current use of insulin (HCC) Comments: Sx did not improve with pregabalin nightly. Unable to take bid b/c it makes him sleepy.  I will increase dose to 75mg  nightly. F/u 6 weeks.  2. BMI 28.0-28.9,adult Comments: He is encouraged to aim for at least 150 minutes of exercise/week.  3. COVID-19 vaccine dose declined  4. Herpes zoster vaccination declined    Return in 6 weeks (on 04/07/2023), or lyrica f/u, for keep next appt as scheduled .  Patient was given opportunity to ask questions. Patient verbalized understanding of the plan and was able to repeat key elements of the plan. All questions were answered to their satisfaction.   I, Gwynneth Aliment, MD, have reviewed all documentation for this visit. The documentation on 02/24/23 for the exam, diagnosis, procedures, and orders are all accurate and complete.   IF YOU HAVE BEEN REFERRED TO A SPECIALIST, IT MAY TAKE 1-2 WEEKS TO SCHEDULE/PROCESS THE REFERRAL. IF YOU HAVE NOT HEARD FROM US/SPECIALIST IN TWO WEEKS, PLEASE GIVE [place] A CALL AT [phone] X [phone].   THE PATIENT IS ENCOURAGED TO PRACTICE SOCIAL DISTANCING DUE TO THE COVID-19 PANDEMIC.

## 2023-02-24 NOTE — Patient Instructions (Signed)

## 2023-03-04 DIAGNOSIS — Z79899 Other long term (current) drug therapy: Secondary | ICD-10-CM | POA: Diagnosis not present

## 2023-03-04 DIAGNOSIS — H30033 Focal chorioretinal inflammation, peripheral, bilateral: Secondary | ICD-10-CM | POA: Diagnosis not present

## 2023-03-06 ENCOUNTER — Encounter: Payer: Self-pay | Admitting: Internal Medicine

## 2023-03-15 DIAGNOSIS — H3581 Retinal edema: Secondary | ICD-10-CM | POA: Diagnosis not present

## 2023-03-15 DIAGNOSIS — H30033 Focal chorioretinal inflammation, peripheral, bilateral: Secondary | ICD-10-CM | POA: Diagnosis not present

## 2023-03-23 ENCOUNTER — Encounter: Payer: Self-pay | Admitting: Cardiology

## 2023-03-23 ENCOUNTER — Ambulatory Visit: Payer: 59 | Attending: Cardiology | Admitting: Cardiology

## 2023-03-23 VITALS — BP 122/68 | HR 55 | Ht 63.0 in | Wt 162.0 lb

## 2023-03-23 DIAGNOSIS — I2584 Coronary atherosclerosis due to calcified coronary lesion: Secondary | ICD-10-CM | POA: Diagnosis not present

## 2023-03-23 DIAGNOSIS — E1140 Type 2 diabetes mellitus with diabetic neuropathy, unspecified: Secondary | ICD-10-CM | POA: Diagnosis not present

## 2023-03-23 DIAGNOSIS — I2510 Atherosclerotic heart disease of native coronary artery without angina pectoris: Secondary | ICD-10-CM | POA: Diagnosis not present

## 2023-03-23 DIAGNOSIS — I10 Essential (primary) hypertension: Secondary | ICD-10-CM

## 2023-03-23 NOTE — Patient Instructions (Signed)
Medication Instructions:  Your physician has recommended you make the following change in your medication: PLEASE ensure you are taking your Lipitor(cholesterol medication) every day.  *If you need a refill on your cardiac medications before your next appointment, please call your pharmacy*   Lab Work: None If you have labs (blood work) drawn today and your tests are completely normal, you will receive your results only by: MyChart Message (if you have MyChart) OR A paper copy in the mail If you have any lab test that is abnormal or we need to change your treatment, we will call you to review the results.   Testing/Procedures: None   Follow-Up: At [facility], you and your health needs are our priority.  As part of our continuing mission to provide you with exceptional heart care, we have created designated Provider Care Teams.  These Care Teams include your primary Cardiologist (physician) and Advanced Practice Providers (APPs -  Physician Assistants and Nurse Practitioners) who all work together to provide you with the care you need, when you need it.  We recommend signing up for the patient portal called "MyChart".  Sign up information is provided on this After Visit Summary.  MyChart is used to connect with patients for Virtual Visits (Telemedicine).  Patients are able to view lab/test results, encounter notes, upcoming appointments, etc.  Non-urgent messages can be sent to your provider as well.   To learn more about what you can do with MyChart, go to [email or web address].    Your next appointment:   6 month(s)  Provider:   Gypsy Balsam, MD    Other Instructions

## 2023-03-23 NOTE — Progress Notes (Signed)
Cardiology Office Note:    Date:  03/23/2023   ID:  Steven [name], Steven [name] Sep 12, 1956, MRN [id number]  PCP:  Dorothyann Peng, MD  Cardiologist:  Gypsy Balsam, MD    Referring MD: Dorothyann Peng, MD   Chief Complaint  Patient presents with   Follow-up    History of Present Illness:    Steven [name] is a 67 y.o. male with past medical history significant for coronary artery disease.  In 2007 he had cardiac catheterization and stent to left anterior descending artery last ablation of this coronary artery was done and the stress test which was done in 2022 showing no evidence of ischemia interestingly in 2010 he had another cardiac catheterization which showed diffuse in-stent restenosis however no significant enough to intervene because there was no ischemia.  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 Comes today to months for follow-up.  Last time I seen him he was complaining of having some atypical chest pain I put him on Imdur 30 which seems to be helping he is completely asymptomatic.  There is no chest pain tightness squeezing pressure burning chest  Past Medical History:  Diagnosis Date   Blindness, legal 12/18/2011   CAD (coronary artery disease), known LAD stent placed in 2007 in [place] 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   CORONARY STENT PLACEMENT     MYOCARDIAL PERFUSION STUDY  01/19/13   NORMAL STRESS NUCLEAR STUDY.   TRANSTHORACIC ECHOCARDIOGRAM  07/28/2009   LV= MILD LVH. EF 60% TO 65%.   VASCULAR US  01/15/09   NORMAL LOWER ARTERIAL DOPPLER    Current Medications: Current Meds  Medication Sig   aspirin EC 81 MG tablet Take 81 mg by mouth daily.   atorvastatin (LIPITOR) 40 MG tablet Take 1 tablet (40 mg total) by mouth daily. Take 1 tablet Monday-Saturday!   cycloSPORINE modified (NEORAL) 25 MG capsule Take 25 mg by mouth 3 (three) times daily.   dorzolamide-timolol (COSOPT) 22.3-6.8 MG/ML ophthalmic solution Place 1 drop into the left eye 2 (two) times daily.   folic acid (FOLVITE) 1 MG tablet Take 1 mg by mouth daily. 4 tabs daily   isosorbide mononitrate (IMDUR) 30 MG 24 hr tablet Take 1 tablet (30 mg total) by mouth daily.   latanoprost (XALATAN) 0.005 % ophthalmic solution Place 1 drop into both eyes at bedtime.   LINZESS 290 MCG CAPS capsule Take 290 mcg by mouth daily.   lisinopril (ZESTRIL) 10 MG tablet Take 1 tablet (10 mg total) by mouth daily.   methocarbamol (ROBAXIN-750) 750 MG tablet Take 1 tablet (750 mg total) by mouth 2 (two) times daily as needed for muscle spasms.   prednisoLONE acetate (PRED FORTE) 1 % ophthalmic suspension Place 1 drop into the left eye  daily.   pregabalin (LYRICA) 75 MG capsule One cap po qpm (Patient taking differently: Take 75 mg by mouth daily. One cap po qpm)   sildenafil (REVATIO) 20 MG tablet Take 1 tablet (20 mg total) by mouth as needed (Take one hour prior to sexual activity). Take one hour prior to sexual activity     Allergies:   Patient has no known allergies.   Social History   Socioeconomic History   Marital status: Divorced    Spouse name: Not on file   Number of children: Not on file   Years of education: Not on file   Highest education level: Not on file  Occupational History    Occupation: disability  Tobacco Use   Smoking status: Former    Packs/day: 0.75    Years: 20.00    Additional pack years: 0.00    Total pack years: 15.00    Types: Cigarettes    Start date: 10/11/1981    Quit date: 10/11/2001    Years since quitting: 21.4   Smokeless tobacco: Never  Vaping Use   Vaping Use: Never used  Substance and Sexual Activity   Alcohol use: No    Alcohol/week: 0.0 standard drinks of alcohol   Drug use: Not Currently   Sexual activity: Yes  Other Topics Concern   Not on file  Social History Narrative   Not on file   Social Determinants of Health   Financial Resource Strain: Low Risk  (11/04/2022)   Overall Financial Resource Strain (CARDIA)    Difficulty of Paying Living Expenses: Not hard at all  Food Insecurity: No Food Insecurity (11/04/2022)   Hunger Vital Sign    Worried About Running Out of Food in the Last Year: Never true    Ran Out of Food in the Last Year: Never true  Transportation Needs: No Transportation Needs (11/04/2022)   PRAPARE - Administrator, Civil Service (Medical): No    Lack of Transportation (Non-Medical): No  Physical Activity: Sufficiently Active (11/04/2022)   Exercise Vital Sign    Days of Exercise per Week: 5 days    Minutes of Exercise per Session: 30 min  Stress: No Stress Concern Present (11/04/2022)   [employer] of Occupational Health - Occupational Stress Questionnaire    Feeling of Stress : Not at all  Social Connections: Not on file     Family History: The patient's family history includes Diabetes in his mother; Heart attack in his mother; Hypertension in his father. ROS:   Please see the history of present illness.    All 14 point review of systems negative except as described per history of present illness  EKGs/Labs/Other Studies Reviewed:      Recent Labs: 11/04/2022: Hemoglobin 14.2; Platelets 185 01/10/2023: ALT 19; BUN 11; Creatinine, Ser 1.19; Potassium 4.5; Sodium 140  Recent Lipid  Panel    Component Value Date/Time   CHOL 178 01/10/2023 1106   TRIG 56 01/10/2023 1106   HDL 85 01/10/2023 1106   CHOLHDL 2.1 01/10/2023 1106   CHOLHDL 2.6 12/19/2011 0138   VLDL 12 12/19/2011 0138   LDLCALC 82 01/10/2023 1106    Physical Exam:    VS:  BP 122/68 (BP Location: Left Arm, Patient Position: Sitting)   Pulse (!) 55   Ht 5\' 3"  (1.6 m)   Wt 162 lb (73.5 kg)   SpO2 97%   BMI 28.70 kg/m     Wt Readings from Last 3 Encounters:  03/23/23 162 lb (  73.5 kg)  02/24/23 163 lb 3.2 oz (74 kg)  01/10/23 162 lb 3.2 oz (73.6 kg)     GEN:  Well nourished, well developed in no acute distress HEENT: Normal NECK: No JVD; No carotid bruits LYMPHATICS: No lymphadenopathy CARDIAC: RRR, no murmurs, no rubs, no gallops RESPIRATORY:  Clear to auscultation without rales, wheezing or rhonchi  ABDOMEN: Soft, non-tender, non-distended MUSCULOSKELETAL:  No edema; No deformity  SKIN: Warm and dry LOWER EXTREMITIES: no swelling NEUROLOGIC:  Alert and oriented x 3 PSYCHIATRIC:  Normal affect   ASSESSMENT:    1. Coronary artery disease involving native coronary artery of native heart without angina pectoris   2. Primary hypertension   3. Coronary atherosclerosis due to calcified coronary lesion (CODE)   4. Type 2 diabetes mellitus with diabetic neuropathy, without long-term current use of insulin (HCC)    PLAN:    In order of problems listed above:  Coronary disease: Stable from that point to an appropriate medications which I will continue. Dyslipidemia I did review his K PN which show me his LDL of 82 HDL 85 this is from 01/10/2023 interestingly I discovered today that he does not take Lipitor every single day.  Asked him to start taking Lipitor every single day and then later will repeat his fasting lipid profile. Type 2 diabetes that being followed by internal medicine team I did review K PN which show me his hemoglobin A1c of 5.8 from April 1 we will continue present  management   Medication Adjustments/Labs and Tests Ordered: Current medicines are reviewed at length with the patient today.  Concerns regarding medicines are outlined above.  No orders of the defined types were placed in this encounter.  Medication changes: No orders of the defined types were placed in this encounter.   Signed, Georgeanna Lea, MD, [facility] 03/23/2023 11:25 AM    [facility]

## 2023-04-03 DIAGNOSIS — H18421 Band keratopathy, right eye: Secondary | ICD-10-CM | POA: Insufficient documentation

## 2023-04-03 DIAGNOSIS — Z961 Presence of intraocular lens: Secondary | ICD-10-CM | POA: Insufficient documentation

## 2023-04-04 DIAGNOSIS — H4042X2 Glaucoma secondary to eye inflammation, left eye, moderate stage: Secondary | ICD-10-CM | POA: Diagnosis not present

## 2023-04-04 DIAGNOSIS — H30033 Focal chorioretinal inflammation, peripheral, bilateral: Secondary | ICD-10-CM | POA: Diagnosis not present

## 2023-04-04 DIAGNOSIS — Z961 Presence of intraocular lens: Secondary | ICD-10-CM | POA: Diagnosis not present

## 2023-04-04 DIAGNOSIS — Z947 Corneal transplant status: Secondary | ICD-10-CM | POA: Diagnosis not present

## 2023-04-04 DIAGNOSIS — H18421 Band keratopathy, right eye: Secondary | ICD-10-CM | POA: Diagnosis not present

## 2023-04-04 DIAGNOSIS — H209 Unspecified iridocyclitis: Secondary | ICD-10-CM | POA: Diagnosis not present

## 2023-04-19 ENCOUNTER — Ambulatory Visit: Payer: Self-pay | Admitting: Internal Medicine

## 2023-04-21 ENCOUNTER — Other Ambulatory Visit: Payer: Self-pay | Admitting: Internal Medicine

## 2023-04-25 ENCOUNTER — Other Ambulatory Visit: Payer: Self-pay | Admitting: Internal Medicine

## 2023-05-05 ENCOUNTER — Other Ambulatory Visit: Payer: Self-pay | Admitting: Nurse Practitioner

## 2023-05-05 MED ORDER — PREGABALIN 75 MG PO CAPS: ORAL_CAPSULE | ORAL | 2 refills | Status: DC

## 2023-05-10 DIAGNOSIS — H30033 Focal chorioretinal inflammation, peripheral, bilateral: Secondary | ICD-10-CM | POA: Diagnosis not present

## 2023-05-10 DIAGNOSIS — Z947 Corneal transplant status: Secondary | ICD-10-CM | POA: Diagnosis not present

## 2023-05-10 DIAGNOSIS — H44521 Atrophy of globe, right eye: Secondary | ICD-10-CM | POA: Diagnosis not present

## 2023-05-10 DIAGNOSIS — H4042X2 Glaucoma secondary to eye inflammation, left eye, moderate stage: Secondary | ICD-10-CM | POA: Diagnosis not present

## 2023-05-10 DIAGNOSIS — H402224 Chronic angle-closure glaucoma, left eye, indeterminate stage: Secondary | ICD-10-CM | POA: Diagnosis not present

## 2023-05-10 DIAGNOSIS — H3581 Retinal edema: Secondary | ICD-10-CM | POA: Diagnosis not present

## 2023-05-10 DIAGNOSIS — H209 Unspecified iridocyclitis: Secondary | ICD-10-CM | POA: Diagnosis not present

## 2023-05-10 DIAGNOSIS — H35033 Hypertensive retinopathy, bilateral: Secondary | ICD-10-CM | POA: Diagnosis not present

## 2023-05-10 DIAGNOSIS — Z79899 Other long term (current) drug therapy: Secondary | ICD-10-CM | POA: Diagnosis not present

## 2023-07-04 ENCOUNTER — Other Ambulatory Visit: Payer: Self-pay | Admitting: Cardiology

## 2023-07-04 DIAGNOSIS — E7800 Pure hypercholesterolemia, unspecified: Secondary | ICD-10-CM

## 2023-07-06 ENCOUNTER — Other Ambulatory Visit: Payer: Self-pay

## 2023-07-06 DIAGNOSIS — E7800 Pure hypercholesterolemia, unspecified: Secondary | ICD-10-CM

## 2023-07-06 MED ORDER — ATORVASTATIN CALCIUM 40 MG PO TABS: 40.0000 mg | ORAL_TABLET | Freq: Every day | ORAL | 3 refills | Status: DC

## 2023-07-08 ENCOUNTER — Emergency Department (HOSPITAL_COMMUNITY)
Admission: EM | Admit: 2023-07-08 | Discharge: 2023-07-08 | Disposition: A | Discharge status: Other Acute Inpatient Hospital | Payer: 59 | Attending: Emergency Medicine | Admitting: Emergency Medicine

## 2023-07-08 ENCOUNTER — Encounter (HOSPITAL_COMMUNITY): Payer: Self-pay | Admitting: Emergency Medicine

## 2023-07-08 ENCOUNTER — Other Ambulatory Visit: Payer: Self-pay

## 2023-07-08 ENCOUNTER — Emergency Department (HOSPITAL_COMMUNITY): Payer: 59

## 2023-07-08 DIAGNOSIS — H16001 Unspecified corneal ulcer, right eye: Secondary | ICD-10-CM | POA: Diagnosis not present

## 2023-07-08 DIAGNOSIS — H3093 Unspecified chorioretinal inflammation, bilateral: Secondary | ICD-10-CM | POA: Diagnosis not present

## 2023-07-08 DIAGNOSIS — H5461 Unqualified visual loss, right eye, normal vision left eye: Secondary | ICD-10-CM | POA: Diagnosis not present

## 2023-07-08 DIAGNOSIS — L03213 Periorbital cellulitis: Secondary | ICD-10-CM | POA: Diagnosis not present

## 2023-07-08 DIAGNOSIS — I2510 Atherosclerotic heart disease of native coronary artery without angina pectoris: Secondary | ICD-10-CM | POA: Insufficient documentation

## 2023-07-08 DIAGNOSIS — I10 Essential (primary) hypertension: Secondary | ICD-10-CM | POA: Insufficient documentation

## 2023-07-08 DIAGNOSIS — Z79621 Long term (current) use of calcineurin inhibitor: Secondary | ICD-10-CM | POA: Diagnosis not present

## 2023-07-08 DIAGNOSIS — H44001 Unspecified purulent endophthalmitis, right eye: Secondary | ICD-10-CM | POA: Diagnosis not present

## 2023-07-08 DIAGNOSIS — Z7982 Long term (current) use of aspirin: Secondary | ICD-10-CM | POA: Diagnosis not present

## 2023-07-08 DIAGNOSIS — H402220 Chronic angle-closure glaucoma, left eye, stage unspecified: Secondary | ICD-10-CM | POA: Diagnosis not present

## 2023-07-08 DIAGNOSIS — Z79899 Other long term (current) drug therapy: Secondary | ICD-10-CM | POA: Diagnosis not present

## 2023-07-08 DIAGNOSIS — E119 Type 2 diabetes mellitus without complications: Secondary | ICD-10-CM | POA: Insufficient documentation

## 2023-07-08 DIAGNOSIS — Z961 Presence of intraocular lens: Secondary | ICD-10-CM | POA: Diagnosis not present

## 2023-07-08 DIAGNOSIS — H1033 Unspecified acute conjunctivitis, bilateral: Secondary | ICD-10-CM | POA: Diagnosis not present

## 2023-07-08 DIAGNOSIS — H5711 Ocular pain, right eye: Secondary | ICD-10-CM | POA: Diagnosis present

## 2023-07-08 DIAGNOSIS — Z79624 Long term (current) use of inhibitors of nucleotide synthesis: Secondary | ICD-10-CM | POA: Diagnosis not present

## 2023-07-08 DIAGNOSIS — Z947 Corneal transplant status: Secondary | ICD-10-CM | POA: Diagnosis not present

## 2023-07-08 DIAGNOSIS — H30003 Unspecified focal chorioretinal inflammation, bilateral: Secondary | ICD-10-CM | POA: Diagnosis not present

## 2023-07-08 DIAGNOSIS — R531 Weakness: Secondary | ICD-10-CM | POA: Diagnosis not present

## 2023-07-08 LAB — CBC WITH DIFFERENTIAL/PLATELET
Abs Immature Granulocytes: 0.03 10*3/uL (ref 0.00–0.07)
Basophils Absolute: 0 10*3/uL (ref 0.0–0.1)
Basophils Relative: 0 %
Eosinophils Absolute: 0 10*3/uL (ref 0.0–0.5)
Eosinophils Relative: 0 %
HCT: 46.4 % (ref 39.0–52.0)
Hemoglobin: 15.6 g/dL (ref 13.0–17.0)
Immature Granulocytes: 0 %
Lymphocytes Relative: 14 %
Lymphs Abs: 1.1 10*3/uL (ref 0.7–4.0)
MCH: 30.5 pg (ref 26.0–34.0)
MCHC: 33.6 g/dL (ref 30.0–36.0)
MCV: 90.6 fL (ref 80.0–100.0)
Monocytes Absolute: 0.7 10*3/uL (ref 0.1–1.0)
Monocytes Relative: 8 %
Neutro Abs: 6.3 10*3/uL (ref 1.7–7.7)
Neutrophils Relative %: 78 %
Platelets: 212 10*3/uL (ref 150–400)
RBC: 5.12 MIL/uL (ref 4.22–5.81)
RDW: 13.1 % (ref 11.5–15.5)
WBC: 8.2 10*3/uL (ref 4.0–10.5)
nRBC: 0 % (ref 0.0–0.2)

## 2023-07-08 LAB — I-STAT CHEM 8, ED
BUN: 9 mg/dL (ref 8–23)
Calcium, Ion: 1.16 mmol/L (ref 1.15–1.40)
Chloride: 103 mmol/L (ref 98–111)
Creatinine, Ser: 1 mg/dL (ref 0.61–1.24)
Glucose, Bld: 158 mg/dL — ABNORMAL HIGH (ref 70–99)
HCT: 47 % (ref 39.0–52.0)
Hemoglobin: 16 g/dL (ref 13.0–17.0)
Potassium: 4.3 mmol/L (ref 3.5–5.1)
Sodium: 137 mmol/L (ref 135–145)
TCO2: 23 mmol/L (ref 22–32)

## 2023-07-08 LAB — BASIC METABOLIC PANEL
Anion gap: 11 (ref 5–15)
BUN: 9 mg/dL (ref 8–23)
CO2: 23 mmol/L (ref 22–32)
Calcium: 9.3 mg/dL (ref 8.9–10.3)
Chloride: 101 mmol/L (ref 98–111)
Creatinine, Ser: 1.09 mg/dL (ref 0.61–1.24)
GFR, Estimated: >60 mL/min (ref >60)
Glucose, Bld: 156 mg/dL — ABNORMAL HIGH (ref 70–99)
Potassium: 4.2 mmol/L (ref 3.5–5.1)
Sodium: 135 mmol/L (ref 135–145)

## 2023-07-08 MED ORDER — IOHEXOL 350 MG/ML SOLN
75.0000 mL | Freq: Once | INTRAVENOUS | Status: AC | PRN
  Administered 2023-07-08: 75 mL via INTRAVENOUS

## 2023-07-08 MED ORDER — SODIUM CHLORIDE 0.9 % IV SOLN
3.0000 g | Freq: Once | INTRAVENOUS | Status: AC
  Administered 2023-07-08: 3 g via INTRAVENOUS
  Filled 2023-07-08: qty 8

## 2023-07-08 MED ORDER — VANCOMYCIN HCL 1500 MG/300ML IV SOLN
1500.0000 mg | Freq: Once | INTRAVENOUS | Status: AC
  Administered 2023-07-08: 1500 mg via INTRAVENOUS
  Filled 2023-07-08: qty 300

## 2023-07-08 MED ORDER — HYDROMORPHONE HCL 1 MG/ML IJ SOLN
1.0000 mg | Freq: Once | INTRAMUSCULAR | Status: AC
  Administered 2023-07-08: 1 mg via INTRAVENOUS
  Filled 2023-07-08: qty 1

## 2023-07-08 MED ORDER — ONDANSETRON HCL 4 MG/2ML IJ SOLN
4.0000 mg | Freq: Once | INTRAMUSCULAR | Status: AC
  Administered 2023-07-08: 4 mg via INTRAVENOUS
  Filled 2023-07-08: qty 2

## 2023-07-08 MED ORDER — FENTANYL CITRATE PF 50 MCG/ML IJ SOSY
50.0000 ug | PREFILLED_SYRINGE | Freq: Once | INTRAMUSCULAR | Status: AC
  Administered 2023-07-08: 50 ug via INTRAVENOUS
  Filled 2023-07-08: qty 1

## 2023-07-08 MED ORDER — TETRACAINE HCL 0.5 % OP SOLN
2.0000 [drp] | Freq: Once | OPHTHALMIC | Status: AC
  Administered 2023-07-08: 2 [drp] via OPHTHALMIC
  Filled 2023-07-08: qty 4

## 2023-07-08 NOTE — ED Provider Notes (Signed)
[place] EMERGENCY DEPARTMENT AT [facility] Provider Note   CSN: [id number] Arrival date & time: 07/08/23  4098     History  Chief Complaint  Patient presents with   Eye Problem    Steven [name] is a 67 y.o. male.  Who presents emergency department with chief complaint of right eye pain.  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  Patient is followed at [facility] ophthalmology.  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  He is currently scheduled for chelation versus enucleation of his right eye.  He is complaining of severe pain and swelling in the right eye for the past 4 days.  He states he does baseline on his left eye.  He does wear contact lenses in the left eye and does not have them but states that his vision is the same as usual.   Eye Problem      Home Medications Prior to Admission medications   Medication Sig Start Date End Date Taking? Authorizing Provider  aspirin EC 81 MG tablet Take 81 mg by mouth daily.    [provider]  atorvastatin (LIPITOR) 40 MG tablet Take 1 tablet (40 mg total) by mouth daily. Take 1 tablet Monday-Saturday! 07/06/23 07/05/24  Dorothyann Peng, MD  cycloSPORINE modified (NEORAL) 25 MG capsule Take 25 mg by mouth 3 (three) times daily. 04/21/21   [provider]  dorzolamide-timolol (COSOPT) 22.3-6.8 MG/ML ophthalmic solution Place 1 drop into the left eye 2 (two) times daily. 07/03/19   [provider]  folic acid (FOLVITE) 1 MG tablet Take 1 mg by mouth daily. 4 tabs daily    [provider]  isosorbide mononitrate (IMDUR) 30 MG 24 hr tablet Take 1 tablet (30 mg total) by mouth daily. 09/15/22   Georgeanna Lea, MD   latanoprost (XALATAN) 0.005 % ophthalmic solution Place 1 drop into both eyes at bedtime. 07/03/19   [provider]  LINZESS 290 MCG CAPS capsule Take 290 mcg by mouth daily. 12/10/13   [provider]  lisinopril (ZESTRIL) 10 MG tablet TAKE 1 TABLET(10 MG) BY MOUTH DAILY 04/25/23   Dorothyann Peng, MD  methocarbamol (ROBAXIN-750) 750 MG tablet Take 1 tablet (750 mg total) by mouth 2 (two) times daily as needed for muscle spasms. 11/15/22   Cristie Hem, PA-C  prednisoLONE acetate (PRED FORTE) 1 % ophthalmic suspension Place 1 drop into the left eye daily.    [provider]  pregabalin (LYRICA) 75 MG capsule One cap po qpm 05/05/23   Arnette Felts, FNP  sildenafil (REVATIO) 20 MG tablet Take 1 tablet (20 mg total) by mouth as needed (Take one hour prior to sexual activity). Take one hour prior to sexual activity 12/30/22   Dorothyann Peng, MD      Allergies    Patient has no known allergies.    Review of Systems   Review of Systems  Physical Exam Updated Vital Signs BP (!) 146/76 (BP Location: Right Arm)   Pulse 86   Temp 98.6 F (37 C) (Oral)   Resp 18   Ht 5\' 3"  (1.6 m)   Wt 73.5 kg   SpO2 96%   BMI 28.70 kg/m  Physical Exam Vitals and nursing note reviewed.  Constitutional:  General: He is not in acute distress.    Appearance: He is well-developed. He is not diaphoretic.  HENT:     Head: Normocephalic and atraumatic.  Eyes:     General: No scleral icterus.    Conjunctiva/sclera: Conjunctivae normal.     Comments: Right eye is erythematous, mildly proptotic, swollen in both lids with discharge and mattering of the eye.  It is painful to open and must be manually opened.  There is severe chemosis of the conjunctiva.  The cornea is completely opacified and purulent appearing with a thick white film.                                       Cardiovascular:     Rate and Rhythm: Normal rate and regular rhythm.     Heart sounds: Normal heart sounds.   Pulmonary:     Effort: Pulmonary effort is normal. No respiratory distress.     Breath sounds: Normal breath sounds.  Abdominal:     Palpations: Abdomen is soft.     Tenderness: There is no abdominal tenderness.  Musculoskeletal:     Cervical back: Normal range of motion and neck supple.  Skin:    General: Skin is warm and dry.  Neurological:     Mental Status: He is alert.  Psychiatric:        Behavior: Behavior normal.     ED Results / Procedures / Treatments   Labs (all labs ordered are listed, but only abnormal results are displayed) Labs Reviewed - No data to display  EKG None  Radiology No results found.  Procedures .Critical Care  Performed by: Arthor Captain, PA-C Authorized by: Arthor Captain, PA-C   Critical care provider statement:    Critical care time (minutes):  75   Critical care time was exclusive of:  Separately billable procedures and treating other patients   Critical care was necessary to treat or prevent imminent or life-threatening deterioration of the following conditions: Organ threatening illness/endophthalmitis.   Critical care was time spent personally by me on the following activities:  Development of treatment plan with patient or surrogate, discussions with consultants, evaluation of patient's response to treatment, examination of patient, ordering and review of laboratory studies, ordering and review of radiographic studies, ordering and performing treatments and interventions, pulse oximetry, re-evaluation of patient's condition, review of old charts, obtaining history from patient or surrogate and interpretation of cardiac output measurements   Care discussed with: accepting provider at another facility       Medications Ordered in ED Medications - No data to display  ED Course/ Medical Decision Making/ A&P Clinical Course as of 07/08/23 1335  Fri Jul 08, 2023  0805 OS: 30 mm/Hg Unable to obtain Visual acuity [AH]  0824 Dr. Marcelle Smiling at [facility] on-call for ophthalmology.  He asked that we proceed with CT imaging, touch base with our ophthalmologist and then call him back with results. [AH]  [phone] Case discussed with radiology- endophthalmitis likely.  Air level in the eye.  I also personally visualized and interpreted images which show significant amount of debris in the globe [AH]  0941 Case discussed w/ Dr. Vonna Kotyk. Reviewed case, images, media pics- needs tertiary care  [AH]  1055 I reached back out to [facility] and spoke with Dr. Arne Cleveland.  He is currently discussing the case with his retinal specialist  and will return my call.  I have power push the images to [facility]. [AH]  1125 Patient accepted in transfer to [facility].  Patient accepted by Dr. Arne Cleveland in ED to ED transfer [AH]    Clinical Course User Index [AH] Arthor Captain, PA-C                                 Medical Decision Making This patient presents to the ED for concern of eye pain on the right, this involves an extensive number of treatment options, and is a complaint that carries with it a high risk of complications and morbidity.  Differential diagnosis includes epidemic [place] conjunctivitis, endophthalmitis, preseptal cellulitis, orbital cellulitis..  Co morbidities:      Sarcoidosis, immunocompromise on cyclosporine and azathioprine, extensive ophthalmologic history  Social Determinants of Health:  SDOH Screenings Food Insecurity: No Food Insecurity (11/04/2022) Transportation Needs: No Transportation Needs (11/04/2022) Depression (PHQ2-9): Low Risk  (02/24/2023) Financial Resource Strain: Low Risk  (11/04/2022) Physical Activity: Sufficiently Active (11/04/2022) Stress: No Stress Concern Present (11/04/2022) Tobacco Use: Medium Risk (07/08/2023)   Additional history:  {Additional history obtained from patient at bedside, nursing staff {External records from outside source  obtained and reviewed including previous outpatient ophthalmology records  Lab Tests:  I Ordered, and personally interpreted labs.  The pertinent results include: Mildly elevated blood glucose, otherwise insignificant lab workup  Imaging Studies:  I ordered imaging studies including CT of the orbits with contrast I independently visualized and interpreted imaging which showed likely endophthalmitis with potential anterior chamber dehiscence I agree with the radiologist interpretation  Cardiac Monitoring/ECG:       The patient was maintained on a cardiac monitor.  I personally viewed and interpreted the cardiac monitored which showed an underlying rhythm of: Sinus rhythm  Medicines ordered and prescription drug management:  I ordered medication including Medications fentaNYL (SUBLIMAZE) injection 50 mcg (50 mcg Intravenous Given 07/08/23 0759) ondansetron (ZOFRAN) injection 4 mg (4 mg Intravenous Given 07/08/23 0758) Ampicillin-Sulbactam (UNASYN) 3 g in sodium chloride 0.9 % 100 mL IVPB (0 g Intravenous Stopped 07/08/23 0925) tetracaine (PONTOCAINE) 0.5 % ophthalmic solution 2 drop (2 drops Left Eye Given 07/08/23 0800) vancomycin (VANCOREADY) IVPB 1500 mg/300 mL (0 mg Intravenous Stopped 07/08/23 1153) iohexol (OMNIPAQUE) 350 MG/ML injection 75 mL (75 mLs Intravenous Contrast Given 07/08/23 0844) HYDROmorphone (DILAUDID) injection 1 mg (1 mg Intravenous Given 07/08/23 0930) HYDROmorphone (DILAUDID) injection 1 mg (1 mg Intravenous Given 07/08/23 1200) for severe pain, eye globe infection Reevaluation of the patient after these medicines showed that the patient improved I have reviewed the patients home medicines and have made adjustments as needed  Test Considered:         Critical Interventions:       Pain medications,  Consultations Obtained: Per ED course  Problem List / ED Course:       (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  Will need transfer to tertiary care for higher level of care.  Patient receiving vancomycin and Unasyn for treatment of infection.  Pain is improved.  Patient is okay with transfer for management of his eye.   Dispostion:  After consideration of the diagnostic results and the patients response to treatment, I feel that the patent would benefit from discharge.    Amount and/or Complexity of Data Reviewed Labs: ordered. Radiology: ordered.  Risk Prescription drug management.           Final Clinical Impression(s) / ED Diagnoses Final diagnoses:  None    Rx / DC Orders ED Discharge Orders     None         Arthor Captain, PA-C 07/08/23 1340    Anders Simmonds T, DO 07/08/23 1510

## 2023-07-08 NOTE — ED Notes (Signed)
Visual acuity test done with PA in the room. Pt not able to read any of the lines due to swollen right eye and not having contact lenses in the left eye.

## 2023-07-08 NOTE — ED Notes (Signed)
[facility] canceled due to [facility]

## 2023-07-08 NOTE — ED Notes (Signed)
[facility] called to transfer patient ed to ed eta 30-57mins

## 2023-07-08 NOTE — Progress Notes (Signed)
ED Pharmacy Antibiotic Sign Off An antibiotic consult was received from an ED provider for vancomycin per pharmacy dosing for suspected endophthalmitis. A chart review was completed to assess appropriateness.   The following one time order(s) were placed:  Vancomycin 1500mg  IV x 1 Unasyn 3g x 1 per EDP  Further antibiotic and/or antibiotic pharmacy consults should be ordered by the admitting provider if indicated.   Thank you for allowing pharmacy to be a part of this patient's care.   Lugene Beougher, Drake Leach, [facility]  Clinical Pharmacist 07/08/23 8:36 AM

## 2023-07-08 NOTE — ED Triage Notes (Addendum)
Patient with swelling, discharge and redness to the R eye since last Tuesday that has progressively gotten worse. Patient states he is "probably blind in this eye since 1997" unable to see out of the eye.Left eye feels fine, no visual changes.

## 2023-07-10 DIAGNOSIS — Z947 Corneal transplant status: Secondary | ICD-10-CM | POA: Diagnosis not present

## 2023-07-10 DIAGNOSIS — H16001 Unspecified corneal ulcer, right eye: Secondary | ICD-10-CM | POA: Diagnosis not present

## 2023-07-10 DIAGNOSIS — D849 Immunodeficiency, unspecified: Secondary | ICD-10-CM | POA: Diagnosis not present

## 2023-07-10 DIAGNOSIS — D84821 Immunodeficiency due to drugs: Secondary | ICD-10-CM | POA: Diagnosis not present

## 2023-07-10 DIAGNOSIS — S0990XA Unspecified injury of head, initial encounter: Secondary | ICD-10-CM | POA: Diagnosis not present

## 2023-07-10 DIAGNOSIS — B965 Pseudomonas (aeruginosa) (mallei) (pseudomallei) as the cause of diseases classified elsewhere: Secondary | ICD-10-CM | POA: Diagnosis not present

## 2023-07-10 DIAGNOSIS — Z79899 Other long term (current) drug therapy: Secondary | ICD-10-CM | POA: Diagnosis not present

## 2023-07-10 DIAGNOSIS — Z743 Need for continuous supervision: Secondary | ICD-10-CM | POA: Diagnosis not present

## 2023-07-10 DIAGNOSIS — K5909 Other constipation: Secondary | ICD-10-CM | POA: Diagnosis not present

## 2023-07-10 DIAGNOSIS — L03213 Periorbital cellulitis: Secondary | ICD-10-CM | POA: Diagnosis not present

## 2023-07-10 DIAGNOSIS — D869 Sarcoidosis, unspecified: Secondary | ICD-10-CM | POA: Diagnosis not present

## 2023-07-10 DIAGNOSIS — R6889 Other general symptoms and signs: Secondary | ICD-10-CM | POA: Diagnosis not present

## 2023-07-10 DIAGNOSIS — H409 Unspecified glaucoma: Secondary | ICD-10-CM | POA: Diagnosis not present

## 2023-07-10 DIAGNOSIS — E1140 Type 2 diabetes mellitus with diabetic neuropathy, unspecified: Secondary | ICD-10-CM | POA: Diagnosis not present

## 2023-07-10 DIAGNOSIS — I25118 Atherosclerotic heart disease of native coronary artery with other forms of angina pectoris: Secondary | ICD-10-CM | POA: Diagnosis not present

## 2023-07-10 DIAGNOSIS — H27121 Anterior dislocation of lens, right eye: Secondary | ICD-10-CM | POA: Diagnosis not present

## 2023-07-10 DIAGNOSIS — I10 Essential (primary) hypertension: Secondary | ICD-10-CM | POA: Diagnosis not present

## 2023-07-10 DIAGNOSIS — E1141 Type 2 diabetes mellitus with diabetic mononeuropathy: Secondary | ICD-10-CM | POA: Diagnosis not present

## 2023-07-10 DIAGNOSIS — Z961 Presence of intraocular lens: Secondary | ICD-10-CM | POA: Diagnosis not present

## 2023-07-10 DIAGNOSIS — D8689 Sarcoidosis of other sites: Secondary | ICD-10-CM | POA: Diagnosis not present

## 2023-07-10 DIAGNOSIS — H5711 Ocular pain, right eye: Secondary | ICD-10-CM | POA: Diagnosis not present

## 2023-07-10 DIAGNOSIS — Z79624 Long term (current) use of inhibitors of nucleotide synthesis: Secondary | ICD-10-CM | POA: Diagnosis not present

## 2023-07-10 DIAGNOSIS — E782 Mixed hyperlipidemia: Secondary | ICD-10-CM | POA: Diagnosis not present

## 2023-07-10 DIAGNOSIS — Z8673 Personal history of transient ischemic attack (TIA), and cerebral infarction without residual deficits: Secondary | ICD-10-CM | POA: Diagnosis not present

## 2023-07-10 DIAGNOSIS — H4489 Other disorders of globe: Secondary | ICD-10-CM | POA: Diagnosis not present

## 2023-07-10 DIAGNOSIS — Z821 Family history of blindness and visual loss: Secondary | ICD-10-CM | POA: Diagnosis not present

## 2023-07-10 DIAGNOSIS — H0520 Unspecified exophthalmos: Secondary | ICD-10-CM | POA: Diagnosis not present

## 2023-07-10 DIAGNOSIS — I2584 Coronary atherosclerosis due to calcified coronary lesion: Secondary | ICD-10-CM | POA: Diagnosis not present

## 2023-07-10 DIAGNOSIS — H05011 Cellulitis of right orbit: Secondary | ICD-10-CM | POA: Diagnosis not present

## 2023-07-10 DIAGNOSIS — H5440 Blindness, one eye, unspecified eye: Secondary | ICD-10-CM | POA: Diagnosis not present

## 2023-07-10 DIAGNOSIS — H40211 Acute angle-closure glaucoma, right eye: Secondary | ICD-10-CM | POA: Diagnosis not present

## 2023-07-10 DIAGNOSIS — H5710 Ocular pain, unspecified eye: Secondary | ICD-10-CM | POA: Diagnosis not present

## 2023-07-10 DIAGNOSIS — H4389 Other disorders of vitreous body: Secondary | ICD-10-CM | POA: Diagnosis not present

## 2023-07-10 DIAGNOSIS — H44009 Unspecified purulent endophthalmitis, unspecified eye: Secondary | ICD-10-CM | POA: Diagnosis not present

## 2023-07-10 DIAGNOSIS — R519 Headache, unspecified: Secondary | ICD-10-CM | POA: Diagnosis not present

## 2023-07-10 DIAGNOSIS — Z83511 Family history of glaucoma: Secondary | ICD-10-CM | POA: Diagnosis not present

## 2023-07-10 DIAGNOSIS — K5904 Chronic idiopathic constipation: Secondary | ICD-10-CM | POA: Diagnosis not present

## 2023-07-10 DIAGNOSIS — H579 Unspecified disorder of eye and adnexa: Secondary | ICD-10-CM | POA: Diagnosis not present

## 2023-07-10 DIAGNOSIS — Z9861 Coronary angioplasty status: Secondary | ICD-10-CM | POA: Diagnosis not present

## 2023-07-10 DIAGNOSIS — E785 Hyperlipidemia, unspecified: Secondary | ICD-10-CM | POA: Diagnosis not present

## 2023-07-10 DIAGNOSIS — H54415A Blindness right eye category 5, normal vision left eye: Secondary | ICD-10-CM | POA: Diagnosis not present

## 2023-07-10 DIAGNOSIS — Z7982 Long term (current) use of aspirin: Secondary | ICD-10-CM | POA: Diagnosis not present

## 2023-07-10 DIAGNOSIS — I119 Hypertensive heart disease without heart failure: Secondary | ICD-10-CM | POA: Diagnosis not present

## 2023-07-10 DIAGNOSIS — H44001 Unspecified purulent endophthalmitis, right eye: Secondary | ICD-10-CM | POA: Diagnosis not present

## 2023-07-11 DIAGNOSIS — H44001 Unspecified purulent endophthalmitis, right eye: Secondary | ICD-10-CM | POA: Diagnosis not present

## 2023-07-12 DIAGNOSIS — H44001 Unspecified purulent endophthalmitis, right eye: Secondary | ICD-10-CM | POA: Diagnosis not present

## 2023-07-12 DIAGNOSIS — H5440 Blindness, one eye, unspecified eye: Secondary | ICD-10-CM | POA: Diagnosis not present

## 2023-07-12 DIAGNOSIS — H5710 Ocular pain, unspecified eye: Secondary | ICD-10-CM | POA: Diagnosis not present

## 2023-07-13 DIAGNOSIS — H44001 Unspecified purulent endophthalmitis, right eye: Secondary | ICD-10-CM | POA: Diagnosis not present

## 2023-07-15 ENCOUNTER — Telehealth: Payer: Self-pay

## 2023-07-15 NOTE — Transitions of Care (Post Inpatient/ED Visit) (Signed)
07/15/2023  Name: RUDRANSH ANNEN MRN: [id number] DOB: 1956-06-24  Today's TOC FU Call Status: Today's TOC FU Call Status:: Unsuccessful Call (1st Attempt) Unsuccessful Call (1st Attempt) Date: 07/15/23  Attempted to reach the patient regarding the most recent Inpatient/ED visit.  Follow Up Plan: Additional outreach attempts will be made to reach the patient to complete the Transitions of Care (Post Inpatient/ED visit) call.   Susa Loffler , BSN, RN Care Management Coordinator [place]   [facility] [email or web address] Direct Dial: [phone]

## 2023-07-18 ENCOUNTER — Telehealth: Payer: Self-pay

## 2023-07-18 ENCOUNTER — Ambulatory Visit: Payer: Self-pay | Admitting: Internal Medicine

## 2023-07-18 NOTE — Transitions of Care (Post Inpatient/ED Visit) (Signed)
07/18/2023  Name: Steven [name] MRN: [id number] DOB: 1956-04-29  Today's TOC FU Call Status: Today's TOC FU Call Status:: Unsuccessful Call (2nd Attempt) Unsuccessful Call (2nd Attempt) Date: 07/18/23 LM on voice with contact info for patient to return call  Attempted to reach the patient regarding the most recent Inpatient/ED visit.  Follow Up Plan: Additional outreach attempts will be made to reach the patient to complete the Transitions of Care (Post Inpatient/ED visit) call.    Susa Loffler , BSN, RN Care Management Coordinator [place]   [facility] [email or web address] Direct Dial: [phone]

## 2023-07-19 ENCOUNTER — Encounter: Payer: Self-pay | Admitting: Internal Medicine

## 2023-07-19 ENCOUNTER — Telehealth: Payer: Self-pay

## 2023-07-19 ENCOUNTER — Ambulatory Visit: Payer: 59 | Admitting: Internal Medicine

## 2023-07-19 VITALS — BP 124/88 | HR 90 | Temp 97.9°F | Ht 63.0 in | Wt 155.0 lb

## 2023-07-19 DIAGNOSIS — D849 Immunodeficiency, unspecified: Secondary | ICD-10-CM

## 2023-07-19 DIAGNOSIS — H44001 Unspecified purulent endophthalmitis, right eye: Secondary | ICD-10-CM | POA: Diagnosis not present

## 2023-07-19 DIAGNOSIS — H30033 Focal chorioretinal inflammation, peripheral, bilateral: Secondary | ICD-10-CM

## 2023-07-19 DIAGNOSIS — I2584 Coronary atherosclerosis due to calcified coronary lesion: Secondary | ICD-10-CM | POA: Diagnosis not present

## 2023-07-19 DIAGNOSIS — K5904 Chronic idiopathic constipation: Secondary | ICD-10-CM | POA: Diagnosis not present

## 2023-07-19 DIAGNOSIS — I119 Hypertensive heart disease without heart failure: Secondary | ICD-10-CM | POA: Diagnosis not present

## 2023-07-19 DIAGNOSIS — E1141 Type 2 diabetes mellitus with diabetic mononeuropathy: Secondary | ICD-10-CM | POA: Diagnosis not present

## 2023-07-19 NOTE — Progress Notes (Signed)
I,Victoria T Deloria Lair, CMA,acting as a Neurosurgeon for Gwynneth Aliment, MD.,have documented all relevant documentation on the behalf of Gwynneth Aliment, MD,as directed by  Gwynneth Aliment, MD while in the presence of Gwynneth Aliment, MD.  Subjective:  Patient ID: Steven [name] , male    DOB: 1955/12/25 , 67 y.o.   MRN: [id number]  Chief Complaint  Patient presents with   Hospitalization Follow-up    HPI  Patient presents today for HPFU. He was transferred to  [facility] on 07/10/2023. Discharged on 07/13/2023 for eye infection per patient.  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   UNFORTUNATELY, PT LEFT PRIOR TO BEING SEEN. I DID WATCH HIM WALK UNASSISTED TO THE EXAM ROOM. HE WAS AN HOUR EARLY AND LEFT BECAUSE HIS RIDE NEEDED TO LEAVE.  THE PATIENT WAS THEN CONTACTED BY PHONE TO COMPLETE THE VISIT. He verbally agreed to complete the visit by phone. His identity was confirmed using two identifiers. He did have a friend present with him for the visit, he agreed to her presence during the visit.   He had been following with ophthalmology as an outpatient at [facility] for right eye corneal ulcer, he has been prescribed vitamin C, doxycycline, and prednisolone drops. He states that the eye pain and swelling continued to progress so he went to [facility] ED. They called for transfer because that team was unable to handle enucleation for endophthalmitis and recommended transfer to [facility] or [facility].  He was seen in the ED by ophthalmology and they recommended tobramycin and vancomycin eyedrops as well as systemic vancomycin and moxifloxacin. At discharge, he was complaining of some pain to the right eye but otherwise states he was doing well. Specifically denies fevers or chills, chest pain, shortness of breath, nausea vomiting diarrhea, other musculoskeletal  complaints.   Since discharge, he states he is doing good. He is having some pain today, 5/10. He requests more pain medication.        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 Take 1 tablet Monday-Saturday!, Disp: 90 tablet, Rfl: 3   cycloSPORINE modified (NEORAL) 25 MG capsule, Take 25 mg by mouth 3 (three) times daily., Disp: , Rfl:    dorzolamide-timolol (COSOPT) 22.3-6.8 MG/ML ophthalmic solution, Place 1 drop into the left eye 2 (two) times daily., Disp: , Rfl:    folic acid (FOLVITE) 1 MG tablet,  Take 1 mg by mouth daily. 4 tabs daily, Disp: , Rfl:    isosorbide mononitrate (IMDUR) 30 MG 24 hr tablet, Take 1 tablet (30 mg total) by mouth daily., Disp: 90 tablet, Rfl: 3   latanoprost (XALATAN) 0.005 % ophthalmic solution, Place 1 drop into both eyes at bedtime., Disp: , Rfl:    LINZESS 290 MCG CAPS capsule, Take 290 mcg by mouth daily., Disp: , Rfl:    lisinopril (ZESTRIL) 10 MG tablet, TAKE 1 TABLET(10 MG) BY MOUTH DAILY, Disp: 90 tablet, Rfl: 1   methocarbamol (ROBAXIN-750) 750 MG tablet, Take 1 tablet (750 mg  total) by mouth 2 (two) times daily as needed for muscle spasms., Disp: 20 tablet, Rfl: 2   prednisoLONE acetate (PRED FORTE) 1 % ophthalmic suspension, Place 1 drop into the left eye daily., Disp: , Rfl:    pregabalin (LYRICA) 75 MG capsule, One cap po qpm, Disp: 30 capsule, Rfl: 2   sildenafil (REVATIO) 20 MG tablet, Take 1 tablet (20 mg total) by mouth as needed (Take one hour prior to sexual activity). Take one hour prior to sexual activity, Disp: 10 tablet, Rfl: 0   oxyCODONE (OXY IR/ROXICODONE) 5 MG immediate release tablet, Take 1 tablet (5 mg total) by mouth every 6 (six) hours as needed for severe pain (pain score 7-10)., Disp: 20 tablet, Rfl: 0   No Known Allergies   Review of Systems  Constitutional: Negative.   HENT: Negative.         Right eye pain  Respiratory: Negative.    Cardiovascular: Negative.   Gastrointestinal: Negative.   Skin: Negative.   Allergic/Immunologic: Negative.   Neurological: Negative.   Hematological: Negative.      Today's Vitals   07/19/23 1442  BP: 124/88  Pulse: 90  Temp: 97.9 F (36.6 C)  SpO2: 98%  Weight: 155 lb (70.3 kg)  Height: 5\' 3"  (1.6 m)   Body mass index is 27.46 kg/m.  Wt Readings from Last 3 Encounters:  07/19/23 155 lb (70.3 kg)  07/08/23 162 lb 0.6 oz (73.5 kg)  03/23/23 162 lb (73.5 kg)     Objective:  Physical Exam Vitals and nursing note reviewed.  HENT:     Head: Normocephalic and atraumatic.     Comments: Patch over eye Pulmonary:     Effort: Pulmonary effort is normal.  Musculoskeletal:        General: Normal range of motion.  Neurological:     Mental Status: He is alert.  Psychiatric:        Mood and Affect: Mood normal.     PHYSICAL EXAM INCLUDES MY OBSERVATION AS HE WALKED TO EXAM ROOM    Assessment And Plan:  Right endophthalmia Assessment & Plan: TCM PERFORMED. A MEMBER OF THE CLINICAL TEAM SPOKE WITH THE PATIENT UPON DISCHARGE. DISCHARGE SUMMARY WAS REVIEWED IN FULL DETAIL DURING THE VISIT.  MEDS RECONCILED AND COMPARED TO DISCHARGE MEDS. MEDICATION LIST WAS UPDATED AND REVIEWED WITH THE PATIENT. GREATER THAN 50% FACE TO FACE TIME WAS SPENT IN COUNSELING AND COORDINATION OF CARE. ALL QUESTIONS WERE ANSWERED TO THE SATISFACTION OF THE PATIENT. He was encouraged to take full course of Levaquin and to keep all scheduled f/u appts with Ophthalmology.     Hypertensive heart disease without heart failure Assessment & Plan: Chronic, fair control. He is in obvious distress.  No med changes today. He is reminded to follow low sodium diet.    Coronary atherosclerosis due to calcified coronary lesion (CODE) Assessment & Plan:  Chronic, he will continue with ASA 81mg , atorvastatin 40mg  daily, lisinopril 10mg   and Imdur 30mg  daily. He is encouraged to follow heart healthy lifestyle.    Diabetic mononeuropathy associated with type 2 diabetes mellitus (HCC) Assessment & Plan: Chronic, hba1c 6.5 during hospitalization. He prefers to not take rx meds. A1c elevation is likely related to his acute infection. I will be sure to recheck in 3 months. Importance of dietary compliance was discussed with the patient.    Chronic idiopathic constipation Assessment & Plan: Chronic, he will continue with Linzess.    Immunosuppression [facility]) Assessment & Plan: Currently on cyclosporine as per Ophthalmology.    Other orders -     oxyCODONE HCl; Take 1 tablet (5 mg total) by mouth every 6 (six) hours as needed for severe pain (pain score 7-10).  Dispense: 20 tablet; Refill: 0   TIME SPENT ON CALL: 20 MINUTES  Return if symptoms worsen or fail to improve.  Patient was given opportunity to ask questions. Patient verbalized understanding of the plan and was able to repeat key elements of the plan. All questions were answered to their satisfaction.    I, Gwynneth Aliment, MD, have reviewed all documentation for this visit. The documentation on 07/19/23 for the exam, diagnosis, procedures, and orders are  all accurate and complete.   IF YOU HAVE BEEN REFERRED TO A SPECIALIST, IT MAY TAKE 1-2 WEEKS TO SCHEDULE/PROCESS THE REFERRAL. IF YOU HAVE NOT HEARD FROM US/SPECIALIST IN TWO WEEKS, PLEASE GIVE [place] A CALL AT [phone].   THE PATIENT IS ENCOURAGED TO PRACTICE SOCIAL DISTANCING DUE TO THE COVID-19 PANDEMIC.

## 2023-07-19 NOTE — Transitions of Care (Post Inpatient/ED Visit) (Signed)
07/19/2023  Name: Steven [name] MRN: [id number] DOB: May 18, 1956  Today's TOC FU Call Status: Today's TOC FU Call Status:: Unsuccessful Call (3rd Attempt) Unsuccessful Call (3rd Attempt) Date: 07/19/23  Attempted to reach the patient regarding the most recent Inpatient/ED visit. LM on patients voice mail with contact info  Noted he has PCP appt today and F/U [facility]. 07/21/23   Follow Up Plan: No further outreach attempts will be made at this time. We have been unable to contact the patient.   Susa Loffler , BSN, RN Care Management Coordinator [place]   [facility] [email or web address] Direct Dial: [phone]

## 2023-07-26 MED ORDER — OXYCODONE HCL 5 MG PO TABS: 5.0000 mg | ORAL_TABLET | Freq: Four times a day (QID) | ORAL | 0 refills | Status: DC | PRN

## 2023-07-28 DIAGNOSIS — H30033 Focal chorioretinal inflammation, peripheral, bilateral: Secondary | ICD-10-CM | POA: Diagnosis not present

## 2023-07-28 DIAGNOSIS — Z79899 Other long term (current) drug therapy: Secondary | ICD-10-CM | POA: Diagnosis not present

## 2023-08-02 DIAGNOSIS — H30033 Focal chorioretinal inflammation, peripheral, bilateral: Secondary | ICD-10-CM | POA: Diagnosis not present

## 2023-08-02 DIAGNOSIS — H209 Unspecified iridocyclitis: Secondary | ICD-10-CM | POA: Diagnosis not present

## 2023-08-02 DIAGNOSIS — H35033 Hypertensive retinopathy, bilateral: Secondary | ICD-10-CM | POA: Diagnosis not present

## 2023-08-02 DIAGNOSIS — Z79899 Other long term (current) drug therapy: Secondary | ICD-10-CM | POA: Diagnosis not present

## 2023-08-02 DIAGNOSIS — H402224 Chronic angle-closure glaucoma, left eye, indeterminate stage: Secondary | ICD-10-CM | POA: Diagnosis not present

## 2023-08-02 DIAGNOSIS — H4042X2 Glaucoma secondary to eye inflammation, left eye, moderate stage: Secondary | ICD-10-CM | POA: Diagnosis not present

## 2023-08-02 DIAGNOSIS — H44521 Atrophy of globe, right eye: Secondary | ICD-10-CM | POA: Diagnosis not present

## 2023-08-02 DIAGNOSIS — Z947 Corneal transplant status: Secondary | ICD-10-CM | POA: Diagnosis not present

## 2023-08-02 DIAGNOSIS — H3581 Retinal edema: Secondary | ICD-10-CM | POA: Diagnosis not present

## 2023-08-04 DIAGNOSIS — H44001 Unspecified purulent endophthalmitis, right eye: Secondary | ICD-10-CM | POA: Diagnosis not present

## 2023-08-04 DIAGNOSIS — H5440 Blindness, one eye, unspecified eye: Secondary | ICD-10-CM | POA: Diagnosis not present

## 2023-08-04 DIAGNOSIS — H5711 Ocular pain, right eye: Secondary | ICD-10-CM | POA: Diagnosis not present

## 2023-08-04 DIAGNOSIS — H119 Unspecified disorder of conjunctiva: Secondary | ICD-10-CM | POA: Diagnosis not present

## 2023-08-07 DIAGNOSIS — H44001 Unspecified purulent endophthalmitis, right eye: Secondary | ICD-10-CM | POA: Insufficient documentation

## 2023-08-07 NOTE — Assessment & Plan Note (Signed)
Chronic, he will continue with ASA 81mg , atorvastatin 40mg  daily, lisinopril 10mg   and Imdur 30mg  daily. He is encouraged to follow heart healthy lifestyle.

## 2023-08-07 NOTE — Patient Instructions (Signed)

## 2023-08-07 NOTE — Assessment & Plan Note (Addendum)
TCM PERFORMED. A MEMBER OF THE CLINICAL TEAM SPOKE WITH THE PATIENT UPON DISCHARGE. DISCHARGE SUMMARY WAS REVIEWED IN FULL DETAIL DURING THE VISIT. MEDS RECONCILED AND COMPARED TO DISCHARGE MEDS. MEDICATION LIST WAS UPDATED AND REVIEWED WITH THE PATIENT. GREATER THAN 50% FACE TO FACE TIME WAS SPENT IN COUNSELING AND COORDINATION OF CARE. ALL QUESTIONS WERE ANSWERED TO THE SATISFACTION OF THE PATIENT. He was encouraged to take full course of Levaquin and to keep all scheduled f/u appts with Ophthalmology.

## 2023-08-07 NOTE — Assessment & Plan Note (Signed)
Chronic, hba1c 6.5 during hospitalization. He prefers to not take rx meds. A1c elevation is likely related to his acute infection. I will be sure to recheck in 3 months. Importance of dietary compliance was discussed with the patient.

## 2023-08-07 NOTE — Assessment & Plan Note (Signed)
Currently on cyclosporine as per Ophthalmology.

## 2023-08-07 NOTE — Assessment & Plan Note (Signed)
Chronic, he will continue with Linzess.

## 2023-08-07 NOTE — Assessment & Plan Note (Signed)
Chronic, fair control. He is in obvious distress.  No med changes today. He is reminded to follow low sodium diet.

## 2023-09-01 ENCOUNTER — Other Ambulatory Visit: Payer: Self-pay | Admitting: Nurse Practitioner

## 2023-09-16 ENCOUNTER — Other Ambulatory Visit: Payer: Self-pay | Admitting: Cardiology

## 2023-09-27 DIAGNOSIS — E782 Mixed hyperlipidemia: Secondary | ICD-10-CM | POA: Diagnosis not present

## 2023-09-27 DIAGNOSIS — K5904 Chronic idiopathic constipation: Secondary | ICD-10-CM | POA: Diagnosis not present

## 2023-09-27 DIAGNOSIS — E119 Type 2 diabetes mellitus without complications: Secondary | ICD-10-CM | POA: Diagnosis not present

## 2023-09-27 DIAGNOSIS — I10 Essential (primary) hypertension: Secondary | ICD-10-CM | POA: Diagnosis not present

## 2023-10-08 ENCOUNTER — Other Ambulatory Visit: Payer: Self-pay | Admitting: Internal Medicine

## 2023-11-01 DIAGNOSIS — Z79899 Other long term (current) drug therapy: Secondary | ICD-10-CM | POA: Diagnosis not present

## 2023-11-01 DIAGNOSIS — H30033 Focal chorioretinal inflammation, peripheral, bilateral: Secondary | ICD-10-CM | POA: Diagnosis not present

## 2023-11-08 DIAGNOSIS — H3581 Retinal edema: Secondary | ICD-10-CM | POA: Diagnosis not present

## 2023-11-08 DIAGNOSIS — H30033 Focal chorioretinal inflammation, peripheral, bilateral: Secondary | ICD-10-CM | POA: Diagnosis not present

## 2023-11-08 DIAGNOSIS — H44521 Atrophy of globe, right eye: Secondary | ICD-10-CM | POA: Diagnosis not present

## 2023-11-08 DIAGNOSIS — H209 Unspecified iridocyclitis: Secondary | ICD-10-CM | POA: Diagnosis not present

## 2023-11-08 DIAGNOSIS — H4042X2 Glaucoma secondary to eye inflammation, left eye, moderate stage: Secondary | ICD-10-CM | POA: Diagnosis not present

## 2023-11-08 DIAGNOSIS — Z79899 Other long term (current) drug therapy: Secondary | ICD-10-CM | POA: Diagnosis not present

## 2023-11-08 DIAGNOSIS — Z947 Corneal transplant status: Secondary | ICD-10-CM | POA: Diagnosis not present

## 2023-11-08 DIAGNOSIS — H35033 Hypertensive retinopathy, bilateral: Secondary | ICD-10-CM | POA: Diagnosis not present

## 2023-11-08 DIAGNOSIS — H402224 Chronic angle-closure glaucoma, left eye, indeterminate stage: Secondary | ICD-10-CM | POA: Diagnosis not present

## 2023-11-11 DIAGNOSIS — H30033 Focal chorioretinal inflammation, peripheral, bilateral: Secondary | ICD-10-CM | POA: Diagnosis not present

## 2023-11-11 DIAGNOSIS — Z79899 Other long term (current) drug therapy: Secondary | ICD-10-CM | POA: Diagnosis not present

## 2023-11-22 ENCOUNTER — Encounter: Payer: Self-pay | Admitting: Cardiology

## 2023-11-22 ENCOUNTER — Other Ambulatory Visit: Payer: Self-pay | Admitting: Internal Medicine

## 2023-11-22 ENCOUNTER — Ambulatory Visit: Payer: 59 | Attending: Cardiology | Admitting: Cardiology

## 2023-11-22 VITALS — BP 126/74 | HR 74 | Ht 65.0 in | Wt 173.0 lb

## 2023-11-22 DIAGNOSIS — E119 Type 2 diabetes mellitus without complications: Secondary | ICD-10-CM

## 2023-11-22 DIAGNOSIS — I2510 Atherosclerotic heart disease of native coronary artery without angina pectoris: Secondary | ICD-10-CM

## 2023-11-22 DIAGNOSIS — E785 Hyperlipidemia, unspecified: Secondary | ICD-10-CM

## 2023-11-22 DIAGNOSIS — I119 Hypertensive heart disease without heart failure: Secondary | ICD-10-CM

## 2023-11-22 NOTE — Addendum Note (Signed)
Addended by: Baldo Ash D on: 11/22/2023 01:48 PM   Modules accepted: Orders

## 2023-11-22 NOTE — Progress Notes (Signed)
Cardiology Office Note:    Date:  11/22/2023   ID:  Steven, [name] 06-11-1956, MRN [id number]  PCP:  Dorothyann Peng, MD  Cardiologist:  Gypsy Balsam, MD    Referring MD: Dorothyann Peng, MD   No chief complaint on file.   History of Present Illness:    Steven [name] is a 68 y.o. male past medical history significant for coronary artery disease in 2007 he get cardiac catheterization he required stent to left anterior descending artery in 2010 he required another cardiac catheterization which showed diffuse in-stent restenosis but not significant enough to intervene 2 years ago he had a stress test done which showed no evidence of ischemia.  Past medical history is also significant for essential hypertension, dyslipidemia he is legally blind secondary to corneal transplant. Comes today to my office for follow-up.  Overall doing well.  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   CORONARY STENT PLACEMENT     ENUCLEATION Right    MYOCARDIAL PERFUSION STUDY  01/19/2013    NORMAL STRESS NUCLEAR STUDY.   TRANSTHORACIC ECHOCARDIOGRAM  07/28/2009   LV= MILD LVH. EF 60% TO 65%.   VASCULAR US  01/15/2009   NORMAL LOWER ARTERIAL DOPPLER    Current Medications: Current Meds  Medication Sig   aspirin EC 81 MG tablet Take 81 mg by mouth daily.   atorvastatin (LIPITOR) 40 MG tablet Take 1 tablet (40 mg total) by mouth daily. Take 1 tablet Monday-Saturday!   cycloSPORINE modified (NEORAL) 25 MG capsule Take 25 mg by mouth 3 (three) times daily.   dorzolamide-timolol (COSOPT) 22.3-6.8 MG/ML ophthalmic solution Place 1 drop into the left eye 2 (two) times daily.   folic acid (FOLVITE) 1 MG tablet Take 1 mg by mouth daily. 4 tabs daily   isosorbide mononitrate (IMDUR) 30 MG 24 hr tablet Take 1 tablet (30 mg total) by mouth daily.   latanoprost (XALATAN) 0.005 % ophthalmic solution Place 1 drop into both eyes at bedtime.   LINZESS 290 MCG CAPS capsule Take 290 mcg by mouth daily.   lisinopril (ZESTRIL) 10 MG tablet TAKE 1 TABLET(10 MG) BY MOUTH DAILY   methocarbamol (ROBAXIN-750) 750 MG tablet Take 1 tablet (750 mg total) by mouth 2 (two) times daily as needed for muscle spasms.   oxyCODONE (OXY IR/ROXICODONE) 5 MG immediate release tablet Take 1 tablet (5 mg total) by mouth every 6 (six) hours as needed for severe pain (pain score 7-10).   prednisoLONE acetate (PRED FORTE) 1 % ophthalmic suspension Place 1 drop into the left  eye daily.   pregabalin (LYRICA) 75 MG capsule TAKE 1 CAPSULE BY MOUTH EVERY EVENING   sildenafil (REVATIO) 20 MG tablet Take 1 tablet (20 mg total) by mouth as needed (Take one hour prior to sexual activity). Take one hour prior to sexual activity     Allergies:   Patient has no known allergies.   Social History   Socioeconomic History   Marital status: Divorced    Spouse name: Not on file   Number of children: Not on file   Years of education: Not on file   Highest education level: Not on file  Occupational History   Occupation: disability   Tobacco Use   Smoking status: Former    Current packs/day: 0.00    Average packs/day: 0.8 packs/day for 20.0 years (15.0 ttl pk-yrs)    Types: Cigarettes    Start date: 10/11/1981    Quit date: 10/11/2001    Years since quitting: 22.1   Smokeless tobacco: Never  Vaping Use   Vaping status: Never Used  Substance and Sexual Activity   Alcohol use: No    Alcohol/week: 0.0 standard drinks of alcohol   Drug use: Not Currently   Sexual activity: Yes  Other Topics Concern   Not on file  Social History Narrative   Not on file   Social Drivers of Health   Financial Resource Strain: Low Risk  (07/14/2023)   Received from [facility]   Overall Financial Resource Strain (CARDIA)    Difficulty of Paying Living Expenses: Not hard at all  Food Insecurity: No Food Insecurity (07/14/2023)   Received from [facility]   Hunger Vital Sign    Worried About Running Out of Food in the Last Year: Never true    Ran Out of Food in the Last Year: Never true  Transportation Needs: No Transportation Needs (07/14/2023)   Received from [facility] - Transportation    In the past 12 months, has lack of transportation kept you from medical appointments or from getting medications?: No    Lack of Transportation (Non-Medical): No  Physical Activity: Sufficiently Active (11/04/2022)   Exercise Vital Sign    Days of Exercise per Week: 5 days    Minutes of Exercise per Session: 30 min  Stress: No Stress Concern Present (11/04/2022)   [employer] of Occupational [employer] - Occupational Stress Questionnaire    Feeling of Stress : Not at all  Social Connections: Not on file     Family History: The patient's family history includes Diabetes in his mother; Heart attack in his mother; Hypertension in his father. ROS:   Please see the history of present illness.    All 14 point review of systems negative except as described per history of present  illness  EKGs/Labs/Other Studies Reviewed:         Recent Labs: 01/10/2023: ALT 19 07/08/2023: BUN 9; Creatinine, Ser 1.00; Hemoglobin 16.0; Platelets 212; Potassium 4.3; Sodium 137  Recent Lipid Panel    Component Value Date/Time   CHOL 178 01/10/2023 1106   TRIG 56 01/10/2023 1106   HDL 85 01/10/2023 1106   CHOLHDL 2.1 01/10/2023 1106   CHOLHDL 2.6 12/19/2011 0138   VLDL 12 12/19/2011 0138   LDLCALC 82 01/10/2023 1106    Physical Exam:    VS:  BP 126/74 (BP Location: Left Arm, Patient Position: Sitting)   Pulse 74   Ht 5\' 5"  (1.651 m)   Wt 173  lb (78.5 kg)   SpO2 97%   BMI 28.79 kg/m     Wt Readings from Last 3 Encounters:  11/22/23 173 lb (78.5 kg)  07/19/23 155 lb (70.3 kg)  07/08/23 162 lb 0.6 oz (73.5 kg)     GEN:  Well nourished, well developed in no acute distress HEENT: Normal NECK: No JVD; No carotid bruits LYMPHATICS: No lymphadenopathy CARDIAC: RRR, no murmurs, no rubs, no gallops RESPIRATORY:  Clear to auscultation without rales, wheezing or rhonchi  ABDOMEN: Soft, non-tender, non-distended MUSCULOSKELETAL:  No edema; No deformity  SKIN: Warm and dry LOWER EXTREMITIES: no swelling NEUROLOGIC:  Alert and oriented x 3 PSYCHIATRIC:  Normal affect   ASSESSMENT:    1. Coronary artery disease involving native coronary artery of native heart without angina pectoris   2. Benign hypertensive heart disease without heart failure   3. Diabetes mellitus type 2 in nonobese (HCC)   4. Dyslipidemia    PLAN:    In order of problems listed above:  Coronary disease: Stable from that point review denies have any chest pain tightness squeezing pressure burning chest. Dyslipidemia I did review K PN which show me LDL 82 HDL 85 this is from 01/10/2023.  I would like to see his LDL below 70.  Will recheck his fasting lipid profile increase medications if needed he is now taking Lipitor 40 mg daily.  He is also on antiplatelet therapy which I will continue. Essential  hypertension: Blood pressure well-controlled continue present management. Diabetes apparently has done he does not have it I see hemoglobin A1c of 5.8 so he would criteria of prediabetes   Medication Adjustments/Labs and Tests Ordered: Current medicines are reviewed at length with the patient today.  Concerns regarding medicines are outlined above.  No orders of the defined types were placed in this encounter.  Medication changes: No orders of the defined types were placed in this encounter.   Signed, Georgeanna Lea, MD, [facility] 11/22/2023 1:38 PM    [facility]

## 2023-11-22 NOTE — Patient Instructions (Addendum)
Medication Instructions:  Your physician recommends that you continue on your current medications as directed. Please refer to the Current Medication list given to you today.  *If you need a refill on your cardiac medications before your next appointment, please call your pharmacy*   Lab Work: 3rd Floor   [address]  Your physician recommends that you return for lab work in:    You need to have labs done when you are fasting.  You can come Monday through Friday 8:00 am to 11:30AM and 1:00 to 4:00. You do not need to make an appointment as the order has already been placed.     Testing/Procedures: None Ordered   Follow-Up: At [facility], you and your health needs are our priority.  As part of our continuing mission to provide you with exceptional heart care, we have created designated Provider Care Teams.  These Care Teams include your primary Cardiologist (physician) and Advanced Practice Providers (APPs -  Physician Assistants and Nurse Practitioners) who all work together to provide you with the care you need, when you need it.  We recommend signing up for the patient portal called "MyChart".  Sign up information is provided on this After Visit Summary.  MyChart is used to connect with patients for Virtual Visits (Telemedicine).  Patients are able to view lab/test results, encounter notes, upcoming appointments, etc.  Non-urgent messages can be sent to your provider as well.   To learn more about what you can do with MyChart, go to [email or web address].    Your next appointment:   6 month(s)  The format for your next appointment:   In Person  Provider:   Gypsy Balsam, MD    Other Instructions NA

## 2023-11-23 DIAGNOSIS — Z961 Presence of intraocular lens: Secondary | ICD-10-CM | POA: Diagnosis not present

## 2023-11-23 DIAGNOSIS — H4042X2 Glaucoma secondary to eye inflammation, left eye, moderate stage: Secondary | ICD-10-CM | POA: Diagnosis not present

## 2023-11-23 DIAGNOSIS — H44521 Atrophy of globe, right eye: Secondary | ICD-10-CM | POA: Diagnosis not present

## 2023-11-23 DIAGNOSIS — H30033 Focal chorioretinal inflammation, peripheral, bilateral: Secondary | ICD-10-CM | POA: Diagnosis not present

## 2023-11-23 DIAGNOSIS — Z947 Corneal transplant status: Secondary | ICD-10-CM | POA: Diagnosis not present

## 2023-11-23 DIAGNOSIS — H209 Unspecified iridocyclitis: Secondary | ICD-10-CM | POA: Diagnosis not present

## 2023-11-24 DIAGNOSIS — H44001 Unspecified purulent endophthalmitis, right eye: Secondary | ICD-10-CM | POA: Diagnosis not present

## 2023-11-24 DIAGNOSIS — H5711 Ocular pain, right eye: Secondary | ICD-10-CM | POA: Diagnosis not present

## 2023-11-24 DIAGNOSIS — H5440 Blindness, one eye, unspecified eye: Secondary | ICD-10-CM | POA: Diagnosis not present

## 2023-12-06 ENCOUNTER — Other Ambulatory Visit: Payer: Self-pay | Admitting: Internal Medicine

## 2023-12-06 NOTE — Telephone Encounter (Signed)
 Last Fill: 11/22/23-printed  Last OV: 07/19/23 Next OV: 01/16/24  Routing to provider for review/authorization.

## 2023-12-06 NOTE — Telephone Encounter (Signed)
 Copied from CRM [id number]. Topic: Clinical - Medication Refill >> Dec 06, 2023  4:53 PM Antony Haste wrote: Most Recent Primary Care Visit:  Provider: Dorothyann Peng  Department: [facility]  Visit Type: HOSPITAL FU  Date: 07/19/2023  Medication: pregabalin (LYRICA) 75 MG capsule  Has the patient contacted their pharmacy? No (Agent: If no, request that the patient contact the pharmacy for the refill. If patient does not wish to contact the pharmacy document the reason why and proceed with request.) (Agent: If yes, when and what did the pharmacy advise?)  Is this the correct pharmacy for this prescription? Yes If no, delete pharmacy and type the correct one.  This is the patient's preferred pharmacy:  [facility] [address] [place] - [address] AT [facility] [address] [place] [address] Phone: [phone] Fax: [phone]   Has the prescription been filled recently? No  Is the patient out of the medication? Yes  Has the patient been seen for an appointment in the last year OR does the patient have an upcoming appointment? Yes  Can we respond through MyChart? No, callback preferred.  Agent: Please be advised that Rx refills may take up to 3 business days. We ask that you follow-up with your pharmacy.

## 2023-12-08 MED ORDER — PREGABALIN 75 MG PO CAPS: 75.0000 mg | ORAL_CAPSULE | Freq: Every evening | ORAL | 2 refills | Status: DC

## 2023-12-28 DIAGNOSIS — H30033 Focal chorioretinal inflammation, peripheral, bilateral: Secondary | ICD-10-CM | POA: Diagnosis not present

## 2023-12-28 DIAGNOSIS — Z79899 Other long term (current) drug therapy: Secondary | ICD-10-CM | POA: Diagnosis not present

## 2024-01-03 DIAGNOSIS — H30033 Focal chorioretinal inflammation, peripheral, bilateral: Secondary | ICD-10-CM | POA: Diagnosis not present

## 2024-01-03 DIAGNOSIS — H3581 Retinal edema: Secondary | ICD-10-CM | POA: Diagnosis not present

## 2024-01-13 ENCOUNTER — Ambulatory Visit

## 2024-01-13 DIAGNOSIS — Z0000 Encounter for general adult medical examination without abnormal findings: Secondary | ICD-10-CM | POA: Diagnosis not present

## 2024-01-13 NOTE — Progress Notes (Signed)
 Subjective:   Steven [name] is a 68 y.o. male who presents for Medicare Annual/Subsequent preventive examination.  Visit Complete: Virtual I connected with  Steven [name] on 01/13/24 by a audio enabled telemedicine application and verified that I am speaking with the correct person using two identifiers.  Patient Location: Home  Provider Location: Office/Clinic  I discussed the limitations of evaluation and management by telemedicine. The patient expressed understanding and agreed to proceed.  Vital Signs: Because this visit was a virtual/telehealth visit, some criteria may be missing or patient reported. Any vitals not documented were not able to be obtained and vitals that have been documented are patient reported.  Patient Medicare AWV questionnaire was completed by the patient on 01/13/2024; I have confirmed that all information answered by patient is correct and no changes since this date.        Objective:    There were no vitals filed for this visit. There is no height or weight on file to calculate BMI.     11/04/2022    3:21 PM 10/14/2021    3:17 PM 03/18/2021   10:30 AM 01/22/2021    9:42 AM 01/10/2020   11:10 AM 07/13/2019    7:15 PM 01/04/2019    2:51 PM  Advanced Directives  Does Patient Have a Medical Advance Directive? No No No No No No Yes  Type of Building surveyor of Healthcare Power of Attorney in Chart?       No - copy requested  Would patient like information on creating a medical advance directive? No - Patient declined No - Patient declined No - Patient declined No - Patient declined No - Patient declined No - Patient declined     Current Medications (verified) Outpatient Encounter Medications as of 01/13/2024  Medication Sig   aspirin EC 81 MG tablet Take 81 mg by mouth daily.   atorvastatin (LIPITOR) 40 MG tablet Take 1 tablet (40 mg total) by mouth daily. Take 1 tablet Monday-Saturday!   cycloSPORINE modified  (NEORAL) 25 MG capsule Take 25 mg by mouth 3 (three) times daily.   dorzolamide-timolol (COSOPT) 22.3-6.8 MG/ML ophthalmic solution Place 1 drop into the left eye 2 (two) times daily.   folic acid (FOLVITE) 1 MG tablet Take 1 mg by mouth daily. 4 tabs daily   isosorbide mononitrate (IMDUR) 30 MG 24 hr tablet Take 1 tablet (30 mg total) by mouth daily.   latanoprost (XALATAN) 0.005 % ophthalmic solution Place 1 drop into both eyes at bedtime.   LINZESS 290 MCG CAPS capsule Take 290 mcg by mouth daily.   lisinopril (ZESTRIL) 10 MG tablet TAKE 1 TABLET(10 MG) BY MOUTH DAILY   methocarbamol (ROBAXIN-750) 750 MG tablet Take 1 tablet (750 mg total) by mouth 2 (two) times daily as needed for muscle spasms.   oxyCODONE (OXY IR/ROXICODONE) 5 MG immediate release tablet Take 1 tablet (5 mg total) by mouth every 6 (six) hours as needed for severe pain (pain score 7-10).   prednisoLONE acetate (PRED FORTE) 1 % ophthalmic suspension Place 1 drop into the left eye daily.   pregabalin (LYRICA) 75 MG capsule Take 1 capsule (75 mg total) by mouth every evening.   sildenafil (REVATIO) 20 MG tablet Take 1 tablet (20 mg total) by mouth as needed (Take one hour prior to sexual activity). Take one hour prior to sexual activity   No facility-administered encounter medications on file as of  01/13/2024.    Allergies (verified) Patient has no known allergies.   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   CORONARY STENT PLACEMENT     ENUCLEATION Right    MYOCARDIAL PERFUSION STUDY  01/19/2013   NORMAL STRESS NUCLEAR STUDY.   TRANSTHORACIC ECHOCARDIOGRAM  07/28/2009   LV= MILD LVH. EF 60% TO 65%.   VASCULAR US  01/15/2009   NORMAL LOWER ARTERIAL DOPPLER   Family History  Problem Relation Age of Onset   Heart attack Mother    Diabetes Mother    Hypertension Father    Social History   Socioeconomic History   Marital status: Divorced    Spouse name: Not on file   Number of children: Not on file   Years of education: Not on file   Highest education level: Not on file  Occupational History   Occupation: disability  Tobacco Use   Smoking status: Former    Current packs/day: 0.00    Average packs/day: 0.8 packs/day for 20.0 years (15.0 ttl pk-yrs)    Types: Cigarettes    Start date: 10/11/1981    Quit date: 10/11/2001    Years since quitting: 22.2   Smokeless tobacco: Never  Vaping Use   Vaping status: Never Used  Substance and Sexual Activity   Alcohol use: No    Alcohol/week: 0.0 standard drinks of alcohol   Drug use: Not Currently   Sexual activity: Yes  Other Topics Concern   Not on file  Social History Narrative   Not on file   Social Drivers of Health   Financial Resource Strain: Low Risk  (07/14/2023)   Received from [facility]   Overall Financial Resource Strain (CARDIA)    Difficulty of Paying Living Expenses: Not hard at all  Food Insecurity: No Food Insecurity (07/14/2023)   Received from [facility]   Hunger Vital Sign    Worried About Running Out of Food in the Last Year: Never true    Ran Out of Food in the Last Year: Never true   Transportation Needs: No Transportation Needs (07/14/2023)   [facility] - Transportation    In the past 12 months, has lack of transportation kept you from medical appointments or from getting medications?: No    Lack of Transportation (Non-Medical): No  Physical Activity: Sufficiently Active (11/04/2022)   Exercise Vital Sign    Days of Exercise per Week: 5 days    Minutes of Exercise per Session: 30 min  Stress: No Stress Concern Present (11/04/2022)   [employer] of Occupational Health - Occupational Stress Questionnaire  Feeling of Stress : Not at all  Social Connections: Not on file    Tobacco Counseling Counseling given: Not Answered   Clinical Intake:                        Activities of Daily Living     No data to display          Patient Care Team: Dorothyann Peng, MD as PCP - General (Internal Medicine) Georgeanna Lea, MD as PCP - Cardiology (Cardiology) Holli Humbles, MD as Referring Physician (Ophthalmology)  Indicate any recent Medical Services you may have received from other than [facility] providers in the past year (date may be approximate).     Assessment:   This is a routine wellness examination for [place].  Hearing/Vision screen No results found.   Goals Addressed   None    Depression Screen    02/24/2023   10:49 AM 01/10/2023   10:08 AM 11/04/2022    3:22 PM 10/14/2021    3:18 PM 01/22/2021    9:43 AM 09/02/2020    9:01 AM 02/18/2020    9:45 AM  PHQ 2/9 Scores  PHQ - 2 Score 0 0 0 0 0 0 0  PHQ- 9 Score       0    Fall Risk    01/10/2023   10:08 AM 11/04/2022    3:22 PM 10/14/2021    3:18 PM 01/22/2021    9:43 AM 02/18/2020    9:44 AM  Fall Risk   Falls in the past year? 0 0 0 0 0  Number falls in past yr: 0 0   0  Injury with Fall? 0 0   0  Risk for fall due to : No Fall Risks Medication side effect Medication side effect Medication side effect   Follow up Falls  evaluation completed Falls prevention discussed;Education provided;Falls evaluation completed Falls evaluation completed;Education provided;Falls prevention discussed Falls evaluation completed;Education provided;Falls prevention discussed     MEDICARE RISK AT HOME:    TIMED UP AND GO:  Was the test performed?  No    Cognitive Function:        11/04/2022    3:23 PM 10/14/2021    3:19 PM 01/22/2021    9:44 AM 01/10/2020   11:12 AM 01/04/2019    2:56 PM  6CIT Screen  What Year? 0 points 0 points 0 points 0 points 0 points  What month? 0 points 0 points 0 points 0 points 0 points  What time? 0 points 0 points 0 points 0 points 0 points  Count back from 20 0 points 0 points 0 points 0 points 0 points  Months in reverse 0 points 0 points 0 points 0 points 0 points  Repeat phrase 2 points 2 points 8 points 2 points 0 points  Total Score 2 points 2 points 8 points 2 points 0 points    Immunizations  There is no immunization history on file for this patient.  TDAP status: Due, Education has been provided regarding the importance of this vaccine. Advised may receive this vaccine at local pharmacy or Health Dept. Aware to provide a copy of the vaccination record if obtained from local pharmacy or Health Dept. Verbalized acceptance and understanding.  Flu Vaccine status: Up to date  Pneumococcal vaccine status: Due, Education has been provided regarding the importance of this vaccine. Advised may receive this vaccine at local pharmacy or Health Dept. Aware to provide a copy  of the vaccination record if obtained from local pharmacy or Health Dept. Verbalized acceptance and understanding.  Covid-19 vaccine status: Information provided on how to obtain vaccines.   Qualifies for Shingles Vaccine? Yes   Zostavax completed No   Shingrix Completed?: No.    Education has been provided regarding the importance of this vaccine. Patient has been advised to call insurance company to determine out of  pocket expense if they have not yet received this vaccine. Advised may also receive vaccine at local pharmacy or Health Dept. Verbalized acceptance and understanding.  Screening Tests Health Maintenance  Topic Date Due   COVID-19 Vaccine (1) Never done   Pneumonia Vaccine 22+ Years old (1 of 2 - PCV) Never done   Zoster Vaccines- Shingrix (1 of 2) Never done   OPHTHALMOLOGY EXAM  02/25/2023   HEMOGLOBIN A1C  07/12/2023   Diabetic kidney evaluation - Urine ACR  11/05/2023   Medicare Annual Wellness (AWV)  11/05/2023   FOOT EXAM  01/10/2024   INFLUENZA VACCINE  05/11/2024   Diabetic kidney evaluation - eGFR measurement  07/07/2024   Colonoscopy  06/28/2032   Hepatitis C Screening  Completed   HPV VACCINES  Aged Out   DTaP/Tdap/Td  Discontinued    Health Maintenance  Health Maintenance Due  Topic Date Due   COVID-19 Vaccine (1) Never done   Pneumonia Vaccine 21+ Years old (1 of 2 - PCV) Never done   Zoster Vaccines- Shingrix (1 of 2) Never done   OPHTHALMOLOGY EXAM  02/25/2023   HEMOGLOBIN A1C  07/12/2023   Diabetic kidney evaluation - Urine ACR  11/05/2023   Medicare Annual Wellness (AWV)  11/05/2023   FOOT EXAM  01/10/2024    Colorectal cancer screening: Type of screening: Colonoscopy. Completed 06/28/2022. Repeat every 10 years  Lung Cancer Screening: (Low Dose CT Chest recommended if Age 51-80 years, 20 pack-year currently smoking OR have quit w/in 15years.) does qualify.   Lung Cancer Screening Referral: not done yet want to discuss at physical.  Additional Screening:  Hepatitis C Screening: does qualify; Completed 05/02/2021  Vision Screening: Recommended annual ophthalmology exams for early detection of glaucoma and other disorders of the eye. Is the patient up to date with their annual eye exam?  Yes  Who is the provider or what is the name of the office in which the patient attends annual eye exams? Dr.shell If pt is not established with a provider, would they like  to be referred to a provider to establish care?  Already established .   Dental Screening: Recommended annual dental exams for proper oral hygiene  Diabetic Foot Exam: Diabetic Foot Exam: Overdue, Pt has been advised about the importance in completing this exam. Pt is scheduled for diabetic foot exam on 01/16/2024.  Community Resource Referral / Chronic Care Management: CRR required this visit?  No   CCM required this visit?  No     Plan:     I have personally reviewed and noted the following in the patient's chart:   Medical and social history Use of alcohol, tobacco or illicit drugs  Current medications and supplements including opioid prescriptions. Patient is not currently taking opioid prescriptions. Functional ability and status Nutritional status Physical activity Advanced directives List of other physicians Hospitalizations, surgeries, and ER visits in previous 12 months Vitals Screenings to include cognitive, depression, and falls Referrals and appointments  In addition, I have reviewed and discussed with patient certain preventive protocols, quality metrics, and best practice recommendations. A written  personalized care plan for preventive services as well as general preventive health recommendations were provided to patient.     Marlyn Corporal, CMA   01/13/2024   After Visit Summary: (Declined) Due to this being a telephonic visit, with patients personalized plan was offered to patient but patient Declined AVS at this time   Nurse Notes: Patient was pleasant.

## 2024-01-16 ENCOUNTER — Ambulatory Visit: Payer: 59 | Admitting: Internal Medicine

## 2024-01-16 ENCOUNTER — Encounter: Payer: Self-pay | Admitting: Internal Medicine

## 2024-01-16 VITALS — BP 130/60 | HR 54 | Temp 98.3°F | Ht 65.0 in | Wt 174.0 lb

## 2024-01-16 DIAGNOSIS — E1141 Type 2 diabetes mellitus with diabetic mononeuropathy: Secondary | ICD-10-CM | POA: Diagnosis not present

## 2024-01-16 DIAGNOSIS — I2584 Coronary atherosclerosis due to calcified coronary lesion: Secondary | ICD-10-CM | POA: Diagnosis not present

## 2024-01-16 DIAGNOSIS — H44001 Unspecified purulent endophthalmitis, right eye: Secondary | ICD-10-CM

## 2024-01-16 DIAGNOSIS — Z2821 Immunization not carried out because of patient refusal: Secondary | ICD-10-CM | POA: Diagnosis not present

## 2024-01-16 DIAGNOSIS — Z125 Encounter for screening for malignant neoplasm of prostate: Secondary | ICD-10-CM

## 2024-01-16 DIAGNOSIS — Z0000 Encounter for general adult medical examination without abnormal findings: Secondary | ICD-10-CM | POA: Diagnosis not present

## 2024-01-16 DIAGNOSIS — I119 Hypertensive heart disease without heart failure: Secondary | ICD-10-CM

## 2024-01-16 NOTE — Patient Instructions (Signed)
 Health Maintenance, Male  Adopting a healthy lifestyle and getting preventive care are important in promoting health and wellness. Ask your health care provider about:  The right schedule for you to have regular tests and exams.  Things you can do on your own to prevent diseases and keep yourself healthy.  What should I know about diet, weight, and exercise?  Eat a healthy diet    Eat a diet that includes plenty of vegetables, fruits, low-fat dairy products, and lean protein.  Do not eat a lot of foods that are high in solid fats, added sugars, or sodium.  Maintain a healthy weight  Body mass index (BMI) is a measurement that can be used to identify possible weight problems. It estimates body fat based on height and weight. Your health care provider can help determine your BMI and help you achieve or maintain a healthy weight.  Get regular exercise  Get regular exercise. This is one of the most important things you can do for your health. Most adults should:  Exercise for at least 150 minutes each week. The exercise should increase your heart rate and make you sweat (moderate-intensity exercise).  Do strengthening exercises at least twice a week. This is in addition to the moderate-intensity exercise.  Spend less time sitting. Even light physical activity can be beneficial.  Watch cholesterol and blood lipids  Have your blood tested for lipids and cholesterol at 67 years of age, then have this test every 5 years.  You may need to have your cholesterol levels checked more often if:  Your lipid or cholesterol levels are high.  You are older than 67 years of age.  You are at high risk for heart disease.  What should I know about cancer screening?  Many types of cancers can be detected early and may often be prevented. Depending on your health history and family history, you may need to have cancer screening at various ages. This may include screening for:  Colorectal cancer.  Prostate cancer.  Skin cancer.  Lung  cancer.  What should I know about heart disease, diabetes, and high blood pressure?  Blood pressure and heart disease  High blood pressure causes heart disease and increases the risk of stroke. This is more likely to develop in people who have high blood pressure readings or are overweight.  Talk with your health care provider about your target blood pressure readings.  Have your blood pressure checked:  Every 3-5 years if you are 9-95 years of age.  Every year if you are 85 years old or older.  If you are between the ages of 29 and 29 and are a current or former smoker, ask your health care provider if you should have a one-time screening for abdominal aortic aneurysm (AAA).  Diabetes  Have regular diabetes screenings. This checks your fasting blood sugar level. Have the screening done:  Once every three years after age 23 if you are at a normal weight and have a low risk for diabetes.  More often and at a younger age if you are overweight or have a high risk for diabetes.  What should I know about preventing infection?  Hepatitis B  If you have a higher risk for hepatitis B, you should be screened for this virus. Talk with your health care provider to find out if you are at risk for hepatitis B infection.  Hepatitis C  Blood testing is recommended for:  Everyone born from [birth year] through 1964.  Anyone  with known risk factors for hepatitis C.  Sexually transmitted infections (STIs)  You should be screened each year for STIs, including gonorrhea and chlamydia, if:  You are sexually active and are younger than 67 years of age.  You are older than 67 years of age and your health care provider tells you that you are at risk for this type of infection.  Your sexual activity has changed since you were last screened, and you are at increased risk for chlamydia or gonorrhea. Ask your health care provider if you are at risk.  Ask your health care provider about whether you are at high risk for HIV. Your health care provider  may recommend a prescription medicine to help prevent HIV infection. If you choose to take medicine to prevent HIV, you should first get tested for HIV. You should then be tested every 3 months for as long as you are taking the medicine.  Follow these instructions at home:  Alcohol use  Do not drink alcohol if your health care provider tells you not to drink.  If you drink alcohol:  Limit how much you have to 0-2 drinks a day.  Know how much alcohol is in your drink. In the [place] one drink equals one 12 oz bottle of beer (355 mL), one 5 oz glass of wine (148 mL), or one 1 oz glass of hard liquor (44 mL).  Lifestyle  Do not use any products that contain nicotine or tobacco. These products include cigarettes, chewing tobacco, and vaping devices, such as e-cigarettes. If you need help quitting, ask your health care provider.  Do not use street drugs.  Do not share needles.  Ask your health care provider for help if you need support or information about quitting drugs.  General instructions  Schedule regular health, dental, and eye exams.  Stay current with your vaccines.  Tell your health care provider if:  You often feel depressed.  You have ever been abused or do not feel safe at home.  Summary  Adopting a healthy lifestyle and getting preventive care are important in promoting health and wellness.  Follow your health care provider's instructions about healthy diet, exercising, and getting tested or screened for diseases.  Follow your health care provider's instructions on monitoring your cholesterol and blood pressure.  This information is not intended to replace advice given to you by your health care provider. Make sure you discuss any questions you have with your health care provider.  Document Revised: 02/16/2021 Document Reviewed: 02/16/2021  Elsevier Patient Education  2024 [employer].

## 2024-01-16 NOTE — Progress Notes (Signed)
 I,[name], CMA,acting as a Neurosurgeon for Smiley Dung, MD.,have documented all relevant documentation on the behalf of Smiley Dung, MD,as directed by  Smiley Dung, MD while in the presence of Smiley Dung, MD.  Subjective:   Patient ID: Steven [name] , male    DOB: September 27, 1956 , 68 y.o.   MRN: [id number]  Chief Complaint  Patient presents with   Annual Exam    Patient presents today for a physical. Patient reports compliance with his meds. Patient denies having chest pain,sob or headaches at this time. Patient doesn't have any questions or concerns.    Diabetes   Hypertension    HPI     Diabetes He presents for his follow-up diabetic visit. He has type 2 diabetes mellitus. Pertinent negatives for diabetes include no blurred vision. There are no hypoglycemic complications. Risk factors for coronary artery disease include diabetes mellitus, dyslipidemia, male sex and hypertension. Current diabetic treatment includes diet. His weight is stable. He is following a diabetic diet. He participates in exercise daily. He does not see a podiatrist.Eye exam is current.  Hypertension This is a chronic problem. The current episode started more than 1 year ago. Pertinent negatives include no blurred vision. Risk factors for coronary artery disease include male gender, diabetes mellitus and dyslipidemia. Hypertensive end-organ damage includes CAD/MI.     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 Take 1 tablet Monday-Saturday!, Disp: 90 tablet, Rfl: 3   cycloSPORINE modified (NEORAL) 25 MG capsule, Take 25 mg by mouth 3 (three) times daily., Disp: , Rfl:    dorzolamide-timolol (COSOPT) 22.3-6.8 MG/ML ophthalmic solution, Place 1 drop into the left eye 2 (two) times daily., Disp: , Rfl:    folic acid (FOLVITE) 1 MG tablet, Take 1 mg by mouth daily. 4 tabs daily, Disp: , Rfl:    isosorbide mononitrate (IMDUR) 30 MG 24 hr tablet, Take 1 tablet (30 mg total) by mouth daily., Disp: 90 tablet, Rfl: 1   latanoprost (XALATAN) 0.005 % ophthalmic solution, Place 1 drop into both eyes at bedtime., Disp: , Rfl:    LINZESS 290 MCG CAPS capsule, Take 290 mcg by mouth daily., Disp: , Rfl:    lisinopril (ZESTRIL) 10 MG tablet, TAKE 1 TABLET(10 MG) BY MOUTH DAILY, Disp: 90 tablet, Rfl: 1   oxyCODONE (OXY IR/ROXICODONE) 5 MG immediate release tablet, Take 1 tablet (5 mg total) by mouth every 6 (six) hours as needed for severe pain (pain score 7-10)., Disp: 20 tablet, Rfl: 0  prednisoLONE acetate (PRED FORTE) 1 % ophthalmic suspension, Place 1 drop into the left eye daily., Disp: , Rfl:    pregabalin (LYRICA) 75 MG capsule, Take 1 capsule (75 mg total) by mouth every evening., Disp: 30 capsule, Rfl: 2   sildenafil (REVATIO) 20 MG tablet, Take 1 tablet (20 mg total) by mouth as needed (Take one hour prior to sexual activity). Take one hour prior to sexual activity, Disp: 10 tablet, Rfl: 0   No Known Allergies   Men's preventive visit. Patient Health Questionnaire (PHQ-2) is  Flowsheet Row Clinical Support from 01/13/2024  in [facility]  PHQ-2 Total Score 0     . Patient is on a healthy diet. Marital status: Divorced. Relevant history for alcohol use is:  Social History   Substance and Sexual Activity  Alcohol Use No   Alcohol/week: 0.0 standard drinks of alcohol  . Relevant history for tobacco use is:  Social History   Tobacco Use  Smoking Status Former   Current packs/day: 0.00   Average packs/day: 0.8 packs/day for 20.0 years (15.0 ttl pk-yrs)   Types: Cigarettes   Start date: 10/11/1981   Quit date: 10/11/2001   Years since quitting: 22.2  Smokeless Tobacco Never  .   Review of Systems  Constitutional: Negative.   HENT: Negative.    Eyes: Negative.  Negative for blurred vision.  Respiratory: Negative.    Cardiovascular: Negative.   Gastrointestinal: Negative.   Endocrine: Negative.   Genitourinary: Negative.   Musculoskeletal: Negative.   Skin: Negative.   Neurological: Negative.   Hematological: Negative.   Psychiatric/Behavioral: Negative.       Today's Vitals   01/16/24 0934  BP: 130/60  Pulse: (!) 54  Temp: 98.3 F (36.8 C)  Weight: 174 lb (78.9 kg)  Height: 5\' 5"  (1.651 m)  PainSc: 0-No pain   Body mass index is 28.96 kg/m.  Wt Readings from Last 3 Encounters:  01/16/24 174 lb (78.9 kg)  11/22/23 173 lb (78.5 kg)  07/19/23 155 lb (70.3 kg)    Objective:  Physical Exam Vitals and nursing note reviewed.  Constitutional:      Appearance: Normal appearance.  HENT:     Head: Normocephalic and atraumatic.     Comments: Right eye prosthesis    Right Ear: Tympanic membrane, ear canal and external ear normal.     Left Ear: Tympanic membrane, ear canal and external ear normal.     Nose:     Comments: Masked     Mouth/Throat:     Comments: Masked  Eyes:     Extraocular Movements: Extraocular movements intact.     Conjunctiva/sclera: Conjunctivae normal.     Pupils: Pupils are equal, round, and reactive to light.     Comments:  Pupil scarring on left  Cardiovascular:     Rate and Rhythm: Normal rate and regular rhythm.     Pulses: Normal pulses.          Dorsalis pedis pulses are 2+ on the right side and 2+ on the left side.     Heart sounds: Normal heart sounds.  Pulmonary:     Effort: Pulmonary effort is normal.     Breath sounds: Normal breath sounds.  Chest:  Breasts:    Right: Normal. No swelling, bleeding, inverted nipple, mass or nipple discharge.     Left: Normal. No swelling, bleeding, inverted nipple, mass or nipple discharge.  Abdominal:     General: Abdomen is flat. Bowel  sounds are normal.     Palpations: Abdomen is soft.  Genitourinary:    Comments: Deferred  Musculoskeletal:        General: Normal range of motion.     Cervical back: Normal range of motion and neck supple.  Feet:     Right foot:     Protective Sensation: 5 sites tested.  5 sites sensed.     Skin integrity: Dry skin present.     Left foot:     Protective Sensation: 5 sites tested.  5 sites sensed.     Skin integrity: Dry skin present.  Skin:    General: Skin is warm.  Neurological:     General: No focal deficit present.     Mental Status: He is alert.  Psychiatric:        Mood and Affect: Mood normal.        Behavior: Behavior normal.         Assessment And Plan:    Encounter for general adult medical examination w/o abnormal findings Assessment & Plan: A full exam was performed.  DRE deferred, per patient request.  He is advised to get 30-45 minutes of regular exercise, no less than four to five days per week. Both weight-bearing and aerobic exercises are recommended.  He is advised to follow a healthy diet with at least six fruits/veggies per day, decrease intake of red meat and other saturated fats and to increase fish intake to twice weekly.  Meats/fish should not be fried -- baked, boiled or broiled is preferable. It is also important to cut back on your sugar intake.  Be sure to read labels - try to avoid  anything with added sugar, high fructose corn syrup or other sweeteners.  If you must use a sweetener, you can try stevia or monkfruit.  It is also important to avoid artificially sweetened foods/beverages and diet drinks. Lastly, wear SPF 50 sunscreen on exposed skin and when in direct sunlight for an extended period of time.  Be sure to avoid fast food restaurants and aim for at least 60 ounces of water daily.       Diabetic mononeuropathy associated with type 2 diabetes mellitus (HCC) Assessment & Plan: Chronic, he prefers to not take rx meds. Diabetic foot exam was performed. Lab Results  Component Value Date   HGBA1C 6.3 (H) 01/16/2024   I will be sure to recheck in 6 months. Importance of dietary compliance was discussed with the patient. I DISCUSSED WITH THE PATIENT AT LENGTH REGARDING THE GOALS OF GLYCEMIC CONTROL AND POSSIBLE LONG-TERM COMPLICATIONS.  I  ALSO STRESSED THE IMPORTANCE OF COMPLIANCE WITH HOME GLUCOSE MONITORING, DIETARY RESTRICTIONS INCLUDING AVOIDANCE OF SUGARY DRINKS/PROCESSED FOODS,  ALONG WITH REGULAR EXERCISE.  I  ALSO STRESSED THE IMPORTANCE OF ANNUAL EYE EXAMS, SELF FOOT CARE AND COMPLIANCE WITH OFFICE VISITS.   Orders: -     Microalbumin / creatinine urine ratio -     CBC -     CMP14+EGFR -     Hemoglobin A1c -     Lipid panel -     TSH  Hypertensive heart disease without heart failure Assessment & Plan: Chronic, controlled.  .  No med changes today. He is reminded to follow low sodium diet. EKG performed, SB w/ right axis.  He will continue with lisinopril 10mg  daily. He will f/u in six months for re-evaluation.        Orders: -     EKG 12-Lead -  Lipid panel -     TSH  Coronary atherosclerosis due to calcified coronary lesion (CODE) Assessment & Plan: Chronic, he will continue with ASA 81mg , atorvastatin 40mg  daily, lisinopril 10mg   and Imdur 30mg  daily. He is encouraged to follow heart healthy lifestyle.    Herpes zoster vaccination  declined  Pneumococcal vaccination declined  Prostate cancer screening -     PSA Total (Reflex To Free)   Return in 6 months (on 07/17/2024), or dm check, for 1 year physical. Patient was given opportunity to ask questions. Patient verbalized understanding of the plan and was able to repeat key elements of the plan. All questions were answered to their satisfaction.   I, Smiley Dung, MD, have reviewed all documentation for this visit. The documentation on 01/16/24 for the exam, diagnosis, procedures, and orders are all accurate and complete.

## 2024-01-17 DIAGNOSIS — E785 Hyperlipidemia, unspecified: Secondary | ICD-10-CM | POA: Diagnosis not present

## 2024-01-17 LAB — CMP14+EGFR
ALT: 25 IU/L (ref 0–44)
AST: 20 IU/L (ref 0–40)
Albumin: 4.7 g/dL (ref 3.9–4.9)
Alkaline Phosphatase: 70 IU/L (ref 44–121)
BUN/Creatinine Ratio: 14 (ref 10–24)
BUN: 15 mg/dL (ref 8–27)
Bilirubin Total: 1 mg/dL (ref 0.0–1.2)
CO2: 23 mmol/L (ref 20–29)
Calcium: 10 mg/dL (ref 8.6–10.2)
Chloride: 102 mmol/L (ref 96–106)
Creatinine, Ser: 1.09 mg/dL (ref 0.76–1.27)
Globulin, Total: 3 g/dL (ref 1.5–4.5)
Glucose: 107 mg/dL — ABNORMAL HIGH (ref 70–99)
Potassium: 4.3 mmol/L (ref 3.5–5.2)
Sodium: 142 mmol/L (ref 134–144)
Total Protein: 7.7 g/dL (ref 6.0–8.5)
eGFR: 75 mL/min/{1.73_m2} (ref >59)

## 2024-01-17 LAB — LIPID PANEL
Chol/HDL Ratio: 2.2 ratio (ref 0.0–5.0)
Cholesterol, Total: 178 mg/dL (ref 100–199)
HDL: 81 mg/dL (ref >39)
LDL Chol Calc (NIH): 84 mg/dL (ref 0–99)
Triglycerides: 70 mg/dL (ref 0–149)
VLDL Cholesterol Cal: 13 mg/dL (ref 5–40)

## 2024-01-17 LAB — HEMOGLOBIN A1C
Est. average glucose Bld gHb Est-mCnc: 134 mg/dL
Hgb A1c MFr Bld: 6.3 % — ABNORMAL HIGH (ref 4.8–5.6)

## 2024-01-17 LAB — CBC
Hematocrit: 48 % (ref 37.5–51.0)
Hemoglobin: 16.2 g/dL (ref 13.0–17.7)
MCH: 31.2 pg (ref 26.6–33.0)
MCHC: 33.8 g/dL (ref 31.5–35.7)
MCV: 93 fL (ref 79–97)
Platelets: 181 10*3/uL (ref 150–450)
RBC: 5.19 x10E6/uL (ref 4.14–5.80)
RDW: 13.1 % (ref 11.6–15.4)
WBC: 3.6 10*3/uL (ref 3.4–10.8)

## 2024-01-17 LAB — MICROALBUMIN / CREATININE URINE RATIO
Creatinine, Urine: 177.3 mg/dL
Microalb/Creat Ratio: <2 mg/g{creat} (ref 0–29)
Microalbumin, Urine: <3 ug/mL

## 2024-01-17 LAB — TSH: TSH: 2.2 u[IU]/mL (ref 0.450–4.500)

## 2024-01-17 LAB — PSA TOTAL (REFLEX TO FREE): Prostate Specific Ag, Serum: 0.5 ng/mL (ref 0.0–4.0)

## 2024-01-18 LAB — LIPID PANEL
Chol/HDL Ratio: 2.2 ratio (ref 0.0–5.0)
Cholesterol, Total: 175 mg/dL (ref 100–199)
HDL: 79 mg/dL (ref >39)
LDL Chol Calc (NIH): 86 mg/dL (ref 0–99)
Triglycerides: 50 mg/dL (ref 0–149)
VLDL Cholesterol Cal: 10 mg/dL (ref 5–40)

## 2024-01-18 LAB — ALT: ALT: 22 IU/L (ref 0–44)

## 2024-01-18 LAB — AST: AST: 19 IU/L (ref 0–40)

## 2024-01-22 NOTE — Assessment & Plan Note (Signed)

## 2024-01-22 NOTE — Assessment & Plan Note (Signed)
Chronic, he will continue with ASA 81mg , atorvastatin 40mg  daily, lisinopril 10mg   and Imdur 30mg  daily. He is encouraged to follow heart healthy lifestyle.

## 2024-01-22 NOTE — Assessment & Plan Note (Signed)
 Chronic, he prefers to not take rx meds. Diabetic foot exam was performed. Lab Results  Component Value Date   HGBA1C 6.3 (H) 01/16/2024   I will be sure to recheck in 6 months. Importance of dietary compliance was discussed with the patient. I DISCUSSED WITH THE PATIENT AT LENGTH REGARDING THE GOALS OF GLYCEMIC CONTROL AND POSSIBLE LONG-TERM COMPLICATIONS.  I  ALSO STRESSED THE IMPORTANCE OF COMPLIANCE WITH HOME GLUCOSE MONITORING, DIETARY RESTRICTIONS INCLUDING AVOIDANCE OF SUGARY DRINKS/PROCESSED FOODS,  ALONG WITH REGULAR EXERCISE.  I  ALSO STRESSED THE IMPORTANCE OF ANNUAL EYE EXAMS, SELF FOOT CARE AND COMPLIANCE WITH OFFICE VISITS.

## 2024-01-22 NOTE — Assessment & Plan Note (Signed)
 Chronic, controlled.  .  No med changes today. He is reminded to follow low sodium diet. EKG performed, SB w/ right axis.  He will continue with lisinopril 10mg  daily. He will f/u in six months for re-evaluation.

## 2024-01-24 ENCOUNTER — Telehealth: Payer: Self-pay

## 2024-01-24 DIAGNOSIS — I2584 Coronary atherosclerosis due to calcified coronary lesion: Secondary | ICD-10-CM

## 2024-01-24 DIAGNOSIS — I2510 Atherosclerotic heart disease of native coronary artery without angina pectoris: Secondary | ICD-10-CM

## 2024-01-24 DIAGNOSIS — E785 Hyperlipidemia, unspecified: Secondary | ICD-10-CM

## 2024-01-24 MED ORDER — ATORVASTATIN CALCIUM 80 MG PO TABS: 80.0000 mg | ORAL_TABLET | Freq: Every day | ORAL | 3 refills | Status: AC

## 2024-01-24 NOTE — Telephone Encounter (Signed)
 Pt was returning nurse call and is requesting a callback. Please advise.

## 2024-01-24 NOTE — Telephone Encounter (Signed)
Returned patient's call and left a message.

## 2024-01-24 NOTE — Addendum Note (Signed)
 Addended by: Rosamond Andress M on: 01/24/2024 02:16 PM   Modules accepted: Orders

## 2024-01-24 NOTE — Telephone Encounter (Signed)
 Patient notified of results and recommendations and agreed with plan, medication sent, lab order on file.

## 2024-01-24 NOTE — Telephone Encounter (Signed)
-----   Message from Ralene Burger sent at 01/24/2024  8:30 AM EDT ----- Cholesterol still elevated, increase Lipitor to 80 mg daily, fasting lipid profile, AST ALT 6 weeks

## 2024-01-24 NOTE — Telephone Encounter (Signed)
 LM to return my call.

## 2024-01-25 ENCOUNTER — Telehealth: Payer: Self-pay

## 2024-01-25 NOTE — Telephone Encounter (Signed)
-----   Message from Ralene Burger sent at 01/24/2024  8:30 AM EDT ----- Cholesterol still elevated, increase Lipitor to 80 mg daily, fasting lipid profile, AST ALT 6 weeks

## 2024-01-26 NOTE — Telephone Encounter (Signed)
 Message addressed on 01/24/24. I returned the patient call and LM.

## 2024-01-26 NOTE — Telephone Encounter (Signed)
 Patient returned CMA's call. He requested confirmation of repeating labs in 6 wks. Advised patient of documentation.

## 2024-01-26 NOTE — Telephone Encounter (Signed)
 Left voicemail 4/17

## 2024-02-22 DIAGNOSIS — H30033 Focal chorioretinal inflammation, peripheral, bilateral: Secondary | ICD-10-CM | POA: Diagnosis not present

## 2024-02-22 DIAGNOSIS — Z961 Presence of intraocular lens: Secondary | ICD-10-CM | POA: Diagnosis not present

## 2024-02-22 DIAGNOSIS — H44521 Atrophy of globe, right eye: Secondary | ICD-10-CM | POA: Diagnosis not present

## 2024-02-22 DIAGNOSIS — H4042X2 Glaucoma secondary to eye inflammation, left eye, moderate stage: Secondary | ICD-10-CM | POA: Diagnosis not present

## 2024-02-22 DIAGNOSIS — Z947 Corneal transplant status: Secondary | ICD-10-CM | POA: Diagnosis not present

## 2024-03-20 DIAGNOSIS — H44119 Panuveitis, unspecified eye: Secondary | ICD-10-CM | POA: Diagnosis not present

## 2024-03-20 DIAGNOSIS — H30033 Focal chorioretinal inflammation, peripheral, bilateral: Secondary | ICD-10-CM | POA: Diagnosis not present

## 2024-03-20 DIAGNOSIS — Z79899 Other long term (current) drug therapy: Secondary | ICD-10-CM | POA: Diagnosis not present

## 2024-03-22 ENCOUNTER — Other Ambulatory Visit: Payer: Self-pay

## 2024-03-22 MED ORDER — ISOSORBIDE MONONITRATE ER 30 MG PO TB24: 30.0000 mg | ORAL_TABLET | Freq: Every day | ORAL | 1 refills | Status: DC

## 2024-03-27 DIAGNOSIS — Z79899 Other long term (current) drug therapy: Secondary | ICD-10-CM | POA: Diagnosis not present

## 2024-03-27 DIAGNOSIS — Z947 Corneal transplant status: Secondary | ICD-10-CM | POA: Diagnosis not present

## 2024-03-27 DIAGNOSIS — H402224 Chronic angle-closure glaucoma, left eye, indeterminate stage: Secondary | ICD-10-CM | POA: Diagnosis not present

## 2024-03-27 DIAGNOSIS — H30033 Focal chorioretinal inflammation, peripheral, bilateral: Secondary | ICD-10-CM | POA: Diagnosis not present

## 2024-03-27 DIAGNOSIS — H35033 Hypertensive retinopathy, bilateral: Secondary | ICD-10-CM | POA: Diagnosis not present

## 2024-03-27 DIAGNOSIS — I10 Essential (primary) hypertension: Secondary | ICD-10-CM | POA: Diagnosis not present

## 2024-03-27 DIAGNOSIS — H44521 Atrophy of globe, right eye: Secondary | ICD-10-CM | POA: Diagnosis not present

## 2024-03-27 DIAGNOSIS — H4042X2 Glaucoma secondary to eye inflammation, left eye, moderate stage: Secondary | ICD-10-CM | POA: Diagnosis not present

## 2024-03-27 DIAGNOSIS — H3581 Retinal edema: Secondary | ICD-10-CM | POA: Diagnosis not present

## 2024-03-27 DIAGNOSIS — H209 Unspecified iridocyclitis: Secondary | ICD-10-CM | POA: Diagnosis not present

## 2024-03-30 ENCOUNTER — Other Ambulatory Visit: Payer: Self-pay | Admitting: Internal Medicine

## 2024-04-14 ENCOUNTER — Other Ambulatory Visit: Payer: Self-pay | Admitting: Internal Medicine

## 2024-05-04 DIAGNOSIS — H4042X2 Glaucoma secondary to eye inflammation, left eye, moderate stage: Secondary | ICD-10-CM | POA: Diagnosis not present

## 2024-05-04 DIAGNOSIS — Z961 Presence of intraocular lens: Secondary | ICD-10-CM | POA: Diagnosis not present

## 2024-05-04 DIAGNOSIS — H30033 Focal chorioretinal inflammation, peripheral, bilateral: Secondary | ICD-10-CM | POA: Diagnosis not present

## 2024-05-04 DIAGNOSIS — H5712 Ocular pain, left eye: Secondary | ICD-10-CM | POA: Diagnosis not present

## 2024-05-04 DIAGNOSIS — H209 Unspecified iridocyclitis: Secondary | ICD-10-CM | POA: Diagnosis not present

## 2024-05-04 DIAGNOSIS — Z947 Corneal transplant status: Secondary | ICD-10-CM | POA: Diagnosis not present

## 2024-06-13 DIAGNOSIS — H209 Unspecified iridocyclitis: Secondary | ICD-10-CM | POA: Diagnosis not present

## 2024-06-13 DIAGNOSIS — H4042X2 Glaucoma secondary to eye inflammation, left eye, moderate stage: Secondary | ICD-10-CM | POA: Diagnosis not present

## 2024-06-13 DIAGNOSIS — H30033 Focal chorioretinal inflammation, peripheral, bilateral: Secondary | ICD-10-CM | POA: Diagnosis not present

## 2024-06-13 DIAGNOSIS — Z947 Corneal transplant status: Secondary | ICD-10-CM | POA: Diagnosis not present

## 2024-06-13 DIAGNOSIS — H44521 Atrophy of globe, right eye: Secondary | ICD-10-CM | POA: Diagnosis not present

## 2024-06-13 DIAGNOSIS — Z961 Presence of intraocular lens: Secondary | ICD-10-CM | POA: Diagnosis not present

## 2024-06-15 DIAGNOSIS — Z4420 Encounter for fitting and adjustment of artificial eye, unspecified: Secondary | ICD-10-CM | POA: Diagnosis not present

## 2024-06-19 DIAGNOSIS — H35033 Hypertensive retinopathy, bilateral: Secondary | ICD-10-CM | POA: Diagnosis not present

## 2024-06-19 DIAGNOSIS — I10 Essential (primary) hypertension: Secondary | ICD-10-CM | POA: Diagnosis not present

## 2024-06-19 DIAGNOSIS — H3581 Retinal edema: Secondary | ICD-10-CM | POA: Diagnosis not present

## 2024-06-19 DIAGNOSIS — H30033 Focal chorioretinal inflammation, peripheral, bilateral: Secondary | ICD-10-CM | POA: Diagnosis not present

## 2024-06-25 DIAGNOSIS — H209 Unspecified iridocyclitis: Secondary | ICD-10-CM | POA: Diagnosis not present

## 2024-06-25 DIAGNOSIS — H4042X2 Glaucoma secondary to eye inflammation, left eye, moderate stage: Secondary | ICD-10-CM | POA: Diagnosis not present

## 2024-06-28 ENCOUNTER — Ambulatory Visit: Admitting: Cardiology

## 2024-07-11 ENCOUNTER — Other Ambulatory Visit: Payer: Self-pay | Admitting: Internal Medicine

## 2024-07-11 DIAGNOSIS — E7800 Pure hypercholesterolemia, unspecified: Secondary | ICD-10-CM

## 2024-07-11 DIAGNOSIS — H30033 Focal chorioretinal inflammation, peripheral, bilateral: Secondary | ICD-10-CM | POA: Diagnosis not present

## 2024-07-11 DIAGNOSIS — Z961 Presence of intraocular lens: Secondary | ICD-10-CM | POA: Diagnosis not present

## 2024-07-11 DIAGNOSIS — H4042X2 Glaucoma secondary to eye inflammation, left eye, moderate stage: Secondary | ICD-10-CM | POA: Diagnosis not present

## 2024-07-11 DIAGNOSIS — Z947 Corneal transplant status: Secondary | ICD-10-CM | POA: Diagnosis not present

## 2024-07-11 DIAGNOSIS — H44521 Atrophy of globe, right eye: Secondary | ICD-10-CM | POA: Diagnosis not present

## 2024-07-11 DIAGNOSIS — H209 Unspecified iridocyclitis: Secondary | ICD-10-CM | POA: Diagnosis not present

## 2024-07-17 ENCOUNTER — Ambulatory Visit: Admitting: Internal Medicine

## 2024-07-17 ENCOUNTER — Encounter: Payer: Self-pay | Admitting: Internal Medicine

## 2024-07-17 VITALS — BP 140/70 | HR 67 | Temp 98.2°F | Ht 65.0 in | Wt 167.0 lb

## 2024-07-17 DIAGNOSIS — E7800 Pure hypercholesterolemia, unspecified: Secondary | ICD-10-CM

## 2024-07-17 DIAGNOSIS — E1141 Type 2 diabetes mellitus with diabetic mononeuropathy: Secondary | ICD-10-CM | POA: Diagnosis not present

## 2024-07-17 DIAGNOSIS — Z2821 Immunization not carried out because of patient refusal: Secondary | ICD-10-CM | POA: Diagnosis not present

## 2024-07-17 DIAGNOSIS — H30033 Focal chorioretinal inflammation, peripheral, bilateral: Secondary | ICD-10-CM

## 2024-07-17 DIAGNOSIS — I2584 Coronary atherosclerosis due to calcified coronary lesion: Secondary | ICD-10-CM

## 2024-07-17 DIAGNOSIS — I119 Hypertensive heart disease without heart failure: Secondary | ICD-10-CM | POA: Diagnosis not present

## 2024-07-17 LAB — CMP14+EGFR
ALT: 13 IU/L (ref 0–44)
AST: 14 IU/L (ref 0–40)
Albumin: 4.4 g/dL (ref 3.9–4.9)
Alkaline Phosphatase: 61 IU/L (ref 47–123)
BUN/Creatinine Ratio: 12 (ref 10–24)
BUN: 14 mg/dL (ref 8–27)
Bilirubin Total: 1 mg/dL (ref 0.0–1.2)
CO2: 23 mmol/L (ref 20–29)
Calcium: 9.2 mg/dL (ref 8.6–10.2)
Chloride: 102 mmol/L (ref 96–106)
Creatinine, Ser: 1.17 mg/dL (ref 0.76–1.27)
Globulin, Total: 2.9 g/dL (ref 1.5–4.5)
Glucose: 109 mg/dL — ABNORMAL HIGH (ref 70–99)
Potassium: 4.8 mmol/L (ref 3.5–5.2)
Sodium: 137 mmol/L (ref 134–144)
Total Protein: 7.3 g/dL (ref 6.0–8.5)
eGFR: 68 mL/min/1.73 (ref >59)

## 2024-07-17 LAB — HEMOGLOBIN A1C
Est. average glucose Bld gHb Est-mCnc: 126 mg/dL
Hgb A1c MFr Bld: 6 % — ABNORMAL HIGH (ref 4.8–5.6)

## 2024-07-17 NOTE — Assessment & Plan Note (Signed)
 Most recent [facility] notes reviewed.  Ongoing treatment with cyclosporine , azathioprine, folic acid , and Humira. Vision improves throughout the day.

## 2024-07-17 NOTE — Assessment & Plan Note (Signed)
 Chronic, he prefers to not take rx meds.  Lab Results  Component Value Date   HGBA1C 6.3 (H) 01/16/2024   I will be sure to recheck in 6 months. Importance of dietary compliance was discussed with the patient.

## 2024-07-17 NOTE — Progress Notes (Signed)
 I,Steven [name], CMA,acting as a Neurosurgeon for Steven LOISE Slocumb, MD.,have documented all relevant documentation on the behalf of Steven LOISE Slocumb, MD,as directed by  Steven LOISE Slocumb, MD while in the presence of Steven LOISE Slocumb, MD.  Subjective:  Patient ID: Steven [name] , male    DOB: Mar 02, 1956 , 68 y.o.   MRN: [id number]  Chief Complaint  Patient presents with   Diabetes   Hypertension    HPI Discussed the use of AI scribe software for clinical note transcription with the patient, who gave verbal consent to proceed.  History of Present Illness Steven [name] is a 68 year old male with diabetes and hypertension who presents for a diabetes and blood pressure check.  He experiences swelling in his cornea and some 'erectness' in his vision. He is currently receiving treatment for this condition and last visited the eye doctor on October 1st. He has an implant in the right eye and wears a contact lens in the left eye. His vision reportedly improves as the day progresses.  He checks his blood sugar occasionally, with readings around 120-125 mg/dL. He is on multiple medications including atorvastatin  80 mg daily, aspirin , cyclosporine , Cosopt eye drops, folic acid , mdor 30 mg daily, Linzess 290 mcg daily, lisinopril  10 mg daily, prednisolone  eye drops, Lyrica  75 mg at night, and Viagra  as needed. He has stopped using oxycodone .  He has a history of focal choroiditis and chorioretinitis, treated with cyclosporin, azathioprine, folic acid , and Humira. His glaucoma is managed with Cosopt and lantanoprost eye drops.  He exercises daily, primarily by walking and climbing stairs, although his activity is limited by his vision. He follows a diet that avoids greasy and fried foods.  He did not take his blood pressure medication this morning due to time constraints. He uses [employer] for transportation due to issues with his usual service.   Diabetes He presents for his follow-up diabetic visit. He  has type 2 diabetes mellitus. Pertinent negatives for diabetes include no blurred vision, no chest pain, no polydipsia, no polyphagia and no polyuria. There are no hypoglycemic complications. Risk factors for coronary artery disease include diabetes mellitus, dyslipidemia, male sex and hypertension. Current diabetic treatment includes diet. His weight is stable. He is following a diabetic diet. He participates in exercise daily. He does not see a podiatrist.Eye exam is current.  Hypertension This is a chronic problem. The current episode started more than 1 year ago. Pertinent negatives include no blurred vision or chest pain. Risk factors for coronary artery disease include male gender, diabetes mellitus and dyslipidemia. Hypertensive end-organ damage includes CAD/MI.     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/28/2011   Mixed hyperlipidemia 09/14/2022   Rectal bleeding 03/31/2021     Family History  Problem Relation Age of Onset   Heart attack Mother    Diabetes Mother    Hypertension Father      Current Outpatient Medications:    aspirin  EC 81 MG  tablet, Take 81 mg by mouth daily., Disp: , Rfl:    atorvastatin  (LIPITOR) 80 MG tablet, Take 1 tablet (80 mg total) by mouth daily., Disp: 90 tablet, Rfl: 3   cycloSPORINE  modified (NEORAL ) 25 MG capsule, Take 25 mg by mouth 3 (three) times daily., Disp: , Rfl:    dorzolamide-timolol (COSOPT) 22.3-6.8 MG/ML ophthalmic solution, Place 1 drop into  the left eye 2 (two) times daily., Disp: , Rfl:    folic acid  (FOLVITE ) 1 MG tablet, Take 1 mg by mouth daily. 4 tabs daily, Disp: , Rfl:    isosorbide  mononitrate (IMDUR ) 30 MG 24 hr tablet, Take 1 tablet (30 mg total) by mouth daily., Disp: 90 tablet, Rfl: 1   latanoprost (XALATAN) 0.005 % ophthalmic solution, Place 1 drop into both eyes at bedtime., Disp: , Rfl:    LINZESS 290 MCG CAPS capsule, Take 290 mcg by mouth daily., Disp: , Rfl:    lisinopril  (ZESTRIL ) 10 MG tablet, TAKE 1 TABLET(10 MG) BY MOUTH DAILY, Disp: 90 tablet, Rfl: 1   prednisoLONE  acetate (PRED FORTE ) 1 % ophthalmic suspension, Place 1 drop into the left eye daily., Disp: , Rfl:    pregabalin  (LYRICA ) 75 MG capsule, TAKE 1 CAPSULE BY MOUTH EVERY EVENING, Disp: 90 capsule, Rfl: 1   sildenafil  (REVATIO ) 20 MG tablet, Take 1 tablet (20 mg total) by mouth as needed (Take one hour prior to sexual activity). Take one hour prior to sexual activity, Disp: 10 tablet, Rfl: 0   No Known Allergies   Review of Systems  Constitutional: Negative.   Eyes: Negative.  Negative for blurred vision.  Respiratory: Negative.    Cardiovascular: Negative.  Negative for chest pain.  Gastrointestinal: Negative.   Endocrine: Negative for polydipsia, polyphagia and polyuria.  Musculoskeletal: Negative.   Skin: Negative.   Psychiatric/Behavioral: Negative.       Today's Vitals   07/17/24 0956  BP: (!) 140/70  Pulse: 67  Temp: 98.2 F (36.8 C)  TempSrc: Oral  Weight: 167 lb (75.8 kg)  Height: 5' 5 (1.651 m)  PainSc: 0-No pain   Body mass index is 27.79 kg/m.  Wt Readings from Last 3 Encounters:  07/17/24 167 lb (75.8 kg)  01/16/24 174 lb (78.9 kg)  11/22/23 173 lb (78.5 kg)    The 10-year ASCVD risk score (Arnett DK, et al., 2019) is: 30%   Values used to calculate the score:     Age: 44 years     Clincally relevant sex: Male     Is Non-Hispanic African American: Yes     Diabetic: Yes     Tobacco smoker: No     Systolic Blood  Pressure: 140 mmHg     Is BP treated: Yes     HDL Cholesterol: 79 mg/dL     Total Cholesterol: 175 mg/dL  Objective:  Physical Exam Vitals and nursing note reviewed.  Constitutional:      Appearance: Normal appearance.  HENT:     Head: Normocephalic and atraumatic.  Eyes:     Extraocular Movements: Extraocular movements intact.  Cardiovascular:     Rate and Rhythm: Normal rate and regular rhythm.     Heart sounds: Normal heart sounds.  Pulmonary:     Effort: Pulmonary effort is normal.     Breath sounds: Normal breath sounds.  Musculoskeletal:     Cervical back: Normal range of motion.  Skin:    General: Skin is warm.  Neurological:     General: No focal deficit present.  Mental Status: He is alert.  Psychiatric:        Mood and Affect: Mood normal.       Assessment And Plan:  Diabetic mononeuropathy associated with type 2 diabetes mellitus (HCC) Assessment & Plan: Chronic, he prefers to not take rx meds.  Lab Results  Component Value Date   HGBA1C 6.3 (H) 01/16/2024   I will be sure to recheck in 6 months. Importance of dietary compliance was discussed with the patient.   Orders: -     CMP14+EGFR -     Hemoglobin A1c  Hypertensive heart disease without heart failure Assessment & Plan: Chronic, uncontrolled.  Blood pressure elevated, likely due to missed medication dose. - Emphasize regular medication adherence. - Recheck blood pressure after lab work. He is reminded to follow low sodium diet.  He will continue with lisinopril  10mg  daily. He will f/u in six months for re-evaluation.        Orders: -     CMP14+EGFR  Pure hypercholesterolemia Assessment & Plan: Chronic, LDL goal is less than 55 due to underlying CAD. He will continue with atorvastatin  80mg  daily.   - Follow heart healthy lifestyle  Orders: -     CMP14+EGFR  Coronary atherosclerosis due to calcified coronary lesion (CODE) Assessment & Plan: Chronic, he will continue with ASA 81mg ,  atorvastatin  40mg  daily, lisinopril  10mg   and Imdur  30mg  daily. He is encouraged to follow heart healthy lifestyle.    Focal choroiditis and chorioretinitis, peripheral, bilateral Assessment & Plan: Most recent [facility] notes reviewed.  Ongoing treatment with cyclosporine , azathioprine, folic acid , and Humira. Vision improves throughout the day.   Herpes zoster vaccination declined  Influenza vaccination declined  Refused flu shot due to past experience of contracting flu post-vaccination.  Return in about 3 months (around 10/17/2024) for dm check.  Patient was given opportunity to ask questions. Patient verbalized understanding of the plan and was able to repeat key elements of the plan. All questions were answered to their satisfaction.    I, Steven LOISE Slocumb, MD, have reviewed all documentation for this visit. The documentation on 07/17/24 for the exam, diagnosis, procedures, and orders are all accurate and complete.   IF YOU HAVE BEEN REFERRED TO A SPECIALIST, IT MAY TAKE 1-2 WEEKS TO SCHEDULE/PROCESS THE REFERRAL. IF YOU HAVE NOT HEARD FROM US /SPECIALIST IN TWO WEEKS, PLEASE GIVE US  A CALL AT [phone].

## 2024-07-17 NOTE — Assessment & Plan Note (Signed)
 Chronic, LDL goal is less than 55 due to underlying CAD. He will continue with atorvastatin  80mg  daily.   - Follow heart healthy lifestyle

## 2024-07-17 NOTE — Assessment & Plan Note (Signed)
Chronic, he will continue with ASA 81mg , atorvastatin 40mg  daily, lisinopril 10mg   and Imdur 30mg  daily. He is encouraged to follow heart healthy lifestyle.

## 2024-07-17 NOTE — Patient Instructions (Signed)

## 2024-07-17 NOTE — Assessment & Plan Note (Addendum)
 Chronic, uncontrolled.  Blood pressure elevated, likely due to missed medication dose. - Emphasize regular medication adherence. - Recheck blood pressure after lab work. He is reminded to follow low sodium diet.  He will continue with lisinopril  10mg  daily. He will f/u in six months for re-evaluation.

## 2024-07-18 ENCOUNTER — Ambulatory Visit: Payer: Self-pay | Admitting: Internal Medicine

## 2024-09-11 ENCOUNTER — Ambulatory Visit: Payer: Self-pay

## 2024-09-14 ENCOUNTER — Ambulatory Visit: Payer: Self-pay

## 2024-09-18 ENCOUNTER — Encounter: Payer: Self-pay | Admitting: Cardiology

## 2024-09-18 ENCOUNTER — Ambulatory Visit: Attending: Cardiology | Admitting: Cardiology

## 2024-09-18 VITALS — BP 130/80 | HR 58 | Ht 65.0 in | Wt 168.0 lb

## 2024-09-18 DIAGNOSIS — I2510 Atherosclerotic heart disease of native coronary artery without angina pectoris: Secondary | ICD-10-CM

## 2024-09-18 DIAGNOSIS — R0609 Other forms of dyspnea: Secondary | ICD-10-CM

## 2024-09-18 DIAGNOSIS — E785 Hyperlipidemia, unspecified: Secondary | ICD-10-CM

## 2024-09-18 DIAGNOSIS — I10 Essential (primary) hypertension: Secondary | ICD-10-CM

## 2024-09-18 NOTE — Progress Notes (Signed)
 Cardiology Office Note:    Date:  09/18/2024   ID:  Steven [name], DOB 1956-06-20, MRN [id number]  PCP:  Jarold Medici, MD  Cardiologist:  Lamar Fitch, MD    Referring MD: Jarold Medici, MD   Chief Complaint  Patient presents with   Follow-up    History of Present Illness:    Steven [name] is a 68 y.o. male past medical history significant for coronary artery disease in 2007 he required stent to left into descending artery, in 2010 he did have another cardiac catheterization which showed diffuse in-stent restenosis but no significant to intervene.  2 years ago he had a stress which showed no evidence of ischemia.  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  He walks around with a wide cane and sometimes with somebody helping him.  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   CORONARY STENT PLACEMENT     ENUCLEATION Right    MYOCARDIAL PERFUSION  STUDY  01/19/2013   NORMAL STRESS NUCLEAR STUDY.   TRANSTHORACIC ECHOCARDIOGRAM  07/28/2009   LV= MILD LVH. EF 60% TO 65%.   VASCULAR US   01/15/2009   NORMAL LOWER ARTERIAL DOPPLER    Current Medications: Current Meds  Medication Sig   aspirin  EC 81 MG tablet Take 81 mg by mouth daily.   atorvastatin  (LIPITOR) 80 MG tablet Take 1 tablet (80 mg total) by mouth daily.   cycloSPORINE  modified (NEORAL ) 25 MG capsule Take 25 mg by mouth 3 (three) times daily.   dorzolamide-timolol (COSOPT) 22.3-6.8 MG/ML ophthalmic solution Place 1 drop into the left eye 2 (two) times daily.   folic acid  (FOLVITE ) 1 MG tablet Take 1 mg by mouth daily. 4 tabs daily   HUMIRA, 2 PEN, 40 MG/0.4ML pen Inject 40 mg into the skin every 14 (fourteen) days.   isosorbide  mononitrate (IMDUR ) 30 MG 24 hr tablet Take 1 tablet (30 mg total) by mouth daily.   latanoprost (XALATAN) 0.005 % ophthalmic solution Place 1 drop into the left eye at bedtime.   LINZESS 290 MCG CAPS capsule Take 290 mcg by mouth daily.   lisinopril  (ZESTRIL ) 10 MG tablet TAKE 1 TABLET(10 MG) BY MOUTH DAILY   prednisoLONE  acetate (PRED FORTE ) 1 % ophthalmic suspension Place 1 drop into the left eye daily.   pregabalin  (LYRICA ) 75 MG capsule TAKE 1 CAPSULE BY MOUTH EVERY EVENING  sildenafil  (REVATIO ) 20 MG tablet Take 1 tablet (20 mg total) by mouth as needed (Take one hour prior to sexual activity). Take one hour prior to sexual activity   [DISCONTINUED] latanoprost (XALATAN) 0.005 % ophthalmic solution Place 1 drop into both eyes at bedtime.     Allergies:   Patient has no known allergies.   Social History   Socioeconomic History   Marital status: Divorced    Spouse name: Not on file   Number of children: Not on file   Years of education: Not on file   Highest education level: Not on file  Occupational History   Occupation: disability  Tobacco Use   Smoking status: Former    Current packs/day: 0.00    Average packs/day: 0.8 packs/day  for 20.0 years (15.0 ttl pk-yrs)    Types: Cigarettes    Start date: 10/11/1981    Quit date: 10/11/2001    Years since quitting: 22.9   Smokeless tobacco: Never  Vaping Use   Vaping status: Never Used  Substance and Sexual Activity   Alcohol  use: No    Alcohol /week: 0.0 standard drinks of alcohol    Drug use: Not Currently   Sexual activity: Yes  Other Topics Concern   Not on file  Social History Narrative   Not on file   Social Drivers of Health   Financial Resource Strain: Low Risk  (01/13/2024)   Overall Financial Resource Strain (CARDIA)    Difficulty of Paying Living Expenses: Not hard at all  Food Insecurity: No Food Insecurity (01/13/2024)   Hunger Vital Sign    Worried About Running Out of Food in the Last Year: Never true    Ran Out of Food in the Last Year: Never true  Transportation Needs: No Transportation Needs (01/13/2024)   PRAPARE - Administrator, Civil Service (Medical): No    Lack of Transportation (Non-Medical): No  Physical Activity: Sufficiently Active (01/13/2024)   Exercise Vital Sign    Days of Exercise per Week: 7 days    Minutes of Exercise per Session: 30 min  Stress: No Stress Concern Present (01/13/2024)   [employer] of Occupational Health - Occupational Stress Questionnaire    Feeling of Stress : Not at all  Social Connections: Moderately Isolated (01/13/2024)   Social Connection and Isolation Panel    Frequency of Communication with Friends and Family: More than three times a week    Frequency of Social Gatherings with Friends and Family: More than three times a week    Attends Religious Services: More than 4 times per year    Active Member of [employer] or Organizations: No    Attends Banker Meetings: Never    Marital Status: Divorced     Family History: The patient's family history includes Diabetes in his mother; Heart attack in his mother; Hypertension in his father. ROS:   Please see the history of present illness.     All 14 point review of systems negative except as described per history of present illness  EKGs/Labs/Other Studies Reviewed:         Recent Labs: 01/16/2024: Hemoglobin 16.2; Platelets 181; TSH 2.200 07/17/2024: ALT 13; BUN 14; Creatinine, Ser 1.17; Potassium 4.8; Sodium 137  Recent Lipid Panel    Component Value Date/Time   CHOL 175 01/17/2024 1330   TRIG 50 01/17/2024 1330   HDL 79 01/17/2024 1330   CHOLHDL 2.2 01/17/2024 1330   CHOLHDL 2.6 12/19/2011 0138   VLDL 12 12/19/2011 0138  LDLCALC 86 01/17/2024 1330    Physical Exam:    VS:  BP 130/80   Pulse (!) 58   Ht 5' 5 (1.651 m)   Wt 168 lb (76.2 kg)   SpO2 99%   BMI 27.96 kg/m     Wt Readings from Last 3 Encounters:  09/18/24 168 lb (76.2 kg)  07/17/24 167 lb (75.8 kg)  01/16/24 174 lb (78.9 kg)     GEN:  Well nourished, well developed in no acute distress HEENT: Normal NECK: No JVD; No carotid bruits LYMPHATICS: No lymphadenopathy CARDIAC: RRR, no murmurs, no rubs, no gallops RESPIRATORY:  Clear to auscultation without rales, wheezing or rhonchi  ABDOMEN: Soft, non-tender, non-distended MUSCULOSKELETAL:  No edema; No deformity  SKIN: Warm and dry LOWER EXTREMITIES: no swelling NEUROLOGIC:  Alert and oriented x 3 PSYCHIATRIC:  Normal affect   ASSESSMENT:    1. Coronary artery disease involving native coronary artery of native heart without angina pectoris   2. Primary hypertension   3. Dyslipidemia    PLAN:    In order of problems listed above:  Coronary disease stable from that point we will continue present management. Dyslipidemia I did review KPN from April which show me LDL of 86 and HDL 79.  Clearly not adequately controlled, will recheck his fasting lipid profile and then probably required addition of Zetia. Essential hypertension blood pressure well-controlled today. Dyspnea on exertion I will ask him to have an echocardiogram done   Medication Adjustments/Labs and Tests  Ordered: Current medicines are reviewed at length with the patient today.  Concerns regarding medicines are outlined above.  No orders of the defined types were placed in this encounter.  Medication changes: No orders of the defined types were placed in this encounter.   Signed, Lamar DOROTHA Fitch, MD, [facility] 09/18/2024 1:19 PM    [facility]

## 2024-09-18 NOTE — Addendum Note (Signed)
 Addended by: ARLOA PLANAS D on: 09/18/2024 01:39 PM   Modules accepted: Orders

## 2024-09-18 NOTE — Patient Instructions (Signed)
 Medication Instructions:  Your physician recommends that you continue on your current medications as directed. Please refer to the Current Medication list given to you today.  *If you need a refill on your cardiac medications before your next appointment, please call your pharmacy*   Lab Work: [address]rd Floor  If you have labs (blood work) drawn today and your tests are completely normal, you will receive your results only by: MyChart Message (if you have MyChart) OR A paper copy in the mail If you have any lab test that is abnormal or we need to change your treatment, we will call you to review the results.   Testing/Procedures: Your physician has requested that you have an echocardiogram. Echocardiography is a painless test that uses sound waves to create images of your heart. It provides your doctor with information about the size and shape of your heart and how well your heart's chambers and valves are working. This procedure takes approximately one hour. There are no restrictions for this procedure. Please do NOT wear cologne, perfume, aftershave, or lotions (deodorant is allowed). Please arrive 15 minutes prior to your appointment time.  Please note: We ask at that you not bring children with you during ultrasound (echo/ vascular) testing. Due to room size and safety concerns, children are not allowed in the ultrasound rooms during exams. Our front office staff cannot provide observation of children in our lobby area while testing is being conducted. An adult accompanying a patient to their appointment will only be allowed in the ultrasound room at the discretion of the ultrasound technician under special circumstances. We apologize for any inconvenience.    Follow-Up: At [facility], you and your health needs are our priority.  As part of our continuing mission to provide you with exceptional heart care, we have created designated Provider Care Teams.  These Care Teams include  your primary Cardiologist (physician) and Advanced Practice Providers (APPs -  Physician Assistants and Nurse Practitioners) who all work together to provide you with the care you need, when you need it.  We recommend signing up for the patient portal called MyChart.  Sign up information is provided on this After Visit Summary.  MyChart is used to connect with patients for Virtual Visits (Telemedicine).  Patients are able to view lab/test results, encounter notes, upcoming appointments, etc.  Non-urgent messages can be sent to your provider as well.   To learn more about what you can do with MyChart, go to [email or web address].    Your next appointment:   6 month(s)  The format for your next appointment:   In Person  Provider:   Lamar Fitch, MD    Other Instructions NA

## 2024-09-19 LAB — LIPID PANEL
Chol/HDL Ratio: 2 ratio (ref 0.0–5.0)
Cholesterol, Total: 171 mg/dL (ref 100–199)
HDL: 86 mg/dL (ref >39)
LDL Chol Calc (NIH): 73 mg/dL (ref 0–99)
Triglycerides: 60 mg/dL (ref 0–149)
VLDL Cholesterol Cal: 12 mg/dL (ref 5–40)

## 2024-09-19 LAB — ALT: ALT: 29 IU/L (ref 0–44)

## 2024-09-19 LAB — AST: AST: 23 IU/L (ref 0–40)

## 2024-09-21 ENCOUNTER — Ambulatory Visit: Payer: Self-pay | Admitting: Cardiology

## 2024-10-05 ENCOUNTER — Other Ambulatory Visit: Payer: Self-pay | Admitting: Cardiology

## 2024-10-09 ENCOUNTER — Ambulatory Visit: Payer: Self-pay

## 2024-10-09 VITALS — BP 112/70 | HR 68 | Temp 98.1°F | Ht 65.0 in | Wt 168.0 lb

## 2024-10-09 DIAGNOSIS — I119 Hypertensive heart disease without heart failure: Secondary | ICD-10-CM

## 2024-10-09 NOTE — Patient Instructions (Signed)
 Hypertension, Adult Hypertension is another name for high blood pressure. High blood pressure forces your heart to work harder to pump blood. This can cause problems over time. There are two numbers in a blood pressure reading. There is a top number (systolic) over a bottom number (diastolic). It is best to have a blood pressure that is below 120/80. What are the causes? The cause of this condition is not known. Some other conditions can lead to high blood pressure. What increases the risk? Some lifestyle factors can make you more likely to develop high blood pressure: Smoking. Not getting enough exercise or physical activity. Being overweight. Having too much fat, sugar, calories, or salt (sodium) in your diet. Drinking too much alcohol. Other risk factors include: Having any of these conditions: Heart disease. Diabetes. High cholesterol. Kidney disease. Obstructive sleep apnea. Having a family history of high blood pressure and high cholesterol. Age. The risk increases with age. Stress. What are the signs or symptoms? High blood pressure may not cause symptoms. Very high blood pressure (hypertensive crisis) may cause: Headache. Fast or uneven heartbeats (palpitations). Shortness of breath. Nosebleed. Vomiting or feeling like you may vomit (nauseous). Changes in how you see. Very bad chest pain. Feeling dizzy. Seizures. How is this treated? This condition is treated by making healthy lifestyle changes, such as: Eating healthy foods. Exercising more. Drinking less alcohol. Your doctor may prescribe medicine if lifestyle changes do not help enough and if: Your top number is above 130. Your bottom number is above 80. Your personal target blood pressure may vary. Follow these instructions at home: Eating and drinking  If told, follow the DASH eating plan. To follow this plan: Fill one half of your plate at each meal with fruits and vegetables. Fill one fourth of your plate  at each meal with whole grains. Whole grains include whole-wheat pasta, brown rice, and whole-grain bread. Eat or drink low-fat dairy products, such as skim milk or low-fat yogurt. Fill one fourth of your plate at each meal with low-fat (lean) proteins. Low-fat proteins include fish, chicken without skin, eggs, beans, and tofu. Avoid fatty meat, cured and processed meat, or chicken with skin. Avoid pre-made or processed food. Limit the amount of salt in your diet to less than 1,500 mg each day. Do not drink alcohol if: Your doctor tells you not to drink. You are pregnant, may be pregnant, or are planning to become pregnant. If you drink alcohol: Limit how much you have to: 0-1 drink a day for women. 0-2 drinks a day for men. Know how much alcohol is in your drink. In the [place] one drink equals one 12 oz bottle of beer (355 mL), one 5 oz glass of wine (148 mL), or one 1 oz glass of hard liquor (44 mL). Lifestyle  Work with your doctor to stay at a healthy weight or to lose weight. Ask your doctor what the best weight is for you. Get at least 30 minutes of exercise that causes your heart to beat faster (aerobic exercise) most days of the week. This may include walking, swimming, or biking. Get at least 30 minutes of exercise that strengthens your muscles (resistance exercise) at least 3 days a week. This may include lifting weights or doing Pilates. Do not smoke or use any products that contain nicotine or tobacco. If you need help quitting, ask your doctor. Check your blood pressure at home as told by your doctor. Keep all follow-up visits. Medicines Take over-the-counter and prescription medicines  only as told by your doctor. Follow directions carefully. Do not skip doses of blood pressure medicine. The medicine does not work as well if you skip doses. Skipping doses also puts you at risk for problems. Ask your doctor about side effects or reactions to medicines that you should watch  for. Contact a doctor if: You think you are having a reaction to the medicine you are taking. You have headaches that keep coming back. You feel dizzy. You have swelling in your ankles. You have trouble with your vision. Get help right away if: You get a very bad headache. You start to feel mixed up (confused). You feel weak or numb. You feel faint. You have very bad pain in your: Chest. Belly (abdomen). You vomit more than once. You have trouble breathing. These symptoms may be an emergency. Get help right away. Call 911. Do not wait to see if the symptoms will go away. Do not drive yourself to the hospital. Summary Hypertension is another name for high blood pressure. High blood pressure forces your heart to work harder to pump blood. For most people, a normal blood pressure is less than 120/80. Making healthy choices can help lower blood pressure. If your blood pressure does not get lower with healthy choices, you may need to take medicine. This information is not intended to replace advice given to you by your health care provider. Make sure you discuss any questions you have with your health care provider. Document Revised: 07/16/2021 Document Reviewed: 07/16/2021 Elsevier Patient Education  2024 [employer].

## 2024-10-09 NOTE — Progress Notes (Signed)
 Patient presents today for a bpc. Patient reports compliance with his meds. Patient reports he is taking lisinopril  10mg  daily in the mornings. I checked his bp and it was 112/70 P68. Patient was advised to continue with his current meds and keep his next appointment as scheduled. YL,RMA    BP Readings from Last 3 Encounters:  09/18/24 130/80  07/17/24 (!) 140/70  01/16/24 130/60

## 2024-10-18 ENCOUNTER — Ambulatory Visit (HOSPITAL_BASED_OUTPATIENT_CLINIC_OR_DEPARTMENT_OTHER)
Admission: RE | Admit: 2024-10-18 | Discharge: 2024-10-18 | Disposition: A | Discharge status: Home / Self Care | Source: Ambulatory Visit | Attending: Cardiology | Admitting: Cardiology

## 2024-10-18 DIAGNOSIS — R0609 Other forms of dyspnea: Secondary | ICD-10-CM | POA: Diagnosis present

## 2024-10-19 LAB — ECHOCARDIOGRAM COMPLETE
AV Mean grad: 3 mmHg
AV Peak grad: 5.9 mmHg
AV Vena cont: 0.2 cm
Ao pk vel: 1.21 m/s
Area-P 1/2: 3.54 cm2
Calc EF: 60.2 %
MV M vel: 5.46 m/s
MV Peak grad: 119.2 mmHg
S' Lateral: 3 cm
Single Plane A2C EF: 60.8 %
Single Plane A4C EF: 60.7 %

## 2024-11-04 ENCOUNTER — Other Ambulatory Visit: Payer: Self-pay | Admitting: Internal Medicine

## 2024-11-07 ENCOUNTER — Other Ambulatory Visit: Payer: Self-pay | Admitting: Internal Medicine

## 2025-01-17 ENCOUNTER — Encounter: Payer: Self-pay | Admitting: Internal Medicine

## 2025-01-30 ENCOUNTER — Ambulatory Visit: Payer: Self-pay
